# Patient Record
Sex: Female | Born: 1940 | Race: White | Hispanic: No | Smoking: Former smoker
Health system: Southern US, Community
[De-identification: ages and names within clinical notes are randomized; demographics above are authoritative.]

## PROBLEM LIST (undated history)

## (undated) DIAGNOSIS — M4800 Spinal stenosis, site unspecified: Secondary | ICD-10-CM

## (undated) DIAGNOSIS — E11628 Type 2 diabetes mellitus with other skin complications: Secondary | ICD-10-CM

## (undated) DIAGNOSIS — M545 Low back pain, unspecified: Secondary | ICD-10-CM

## (undated) DIAGNOSIS — J4489 Other specified chronic obstructive pulmonary disease: Secondary | ICD-10-CM

## (undated) DIAGNOSIS — J449 Chronic obstructive pulmonary disease, unspecified: Secondary | ICD-10-CM

## (undated) DIAGNOSIS — R269 Unspecified abnormalities of gait and mobility: Secondary | ICD-10-CM

## (undated) DIAGNOSIS — F3289 Other specified depressive episodes: Secondary | ICD-10-CM

## (undated) DIAGNOSIS — F329 Major depressive disorder, single episode, unspecified: Secondary | ICD-10-CM

## (undated) DIAGNOSIS — F411 Generalized anxiety disorder: Secondary | ICD-10-CM

## (undated) DIAGNOSIS — M810 Age-related osteoporosis without current pathological fracture: Secondary | ICD-10-CM

## (undated) DIAGNOSIS — F0390 Unspecified dementia without behavioral disturbance: Secondary | ICD-10-CM

## (undated) DIAGNOSIS — E785 Hyperlipidemia, unspecified: Secondary | ICD-10-CM

## (undated) DIAGNOSIS — B3749 Other urogenital candidiasis: Secondary | ICD-10-CM

## (undated) DIAGNOSIS — K219 Gastro-esophageal reflux disease without esophagitis: Secondary | ICD-10-CM

## (undated) HISTORY — DX: Unspecified dementia, unspecified severity, without behavioral disturbance, psychotic disturbance, mood disturbance, and anxiety: F03.90

## (undated) HISTORY — DX: Hyperlipidemia, unspecified: E78.5

## (undated) HISTORY — DX: Chronic obstructive pulmonary disease, unspecified: J44.9

## (undated) HISTORY — DX: Type 2 diabetes mellitus with other skin complications: E11.628

## (undated) HISTORY — PX: APPENDECTOMY: SHX54

## (undated) HISTORY — PX: VERTEBROPLASTY: SHX113

## (undated) HISTORY — DX: Age-related osteoporosis without current pathological fracture: M81.0

## (undated) HISTORY — PX: ABDOMINAL HYSTERECTOMY: SHX81

## (undated) HISTORY — PX: CHOLECYSTECTOMY: SHX55

---

## 1999-01-09 ENCOUNTER — Other Ambulatory Visit: Admission: RE | Admit: 1999-01-09 | Discharge: 1999-01-09 | Payer: Self-pay | Admitting: Otolaryngology

## 2000-02-19 ENCOUNTER — Encounter: Payer: Self-pay | Admitting: Internal Medicine

## 2000-02-19 ENCOUNTER — Encounter: Admission: RE | Admit: 2000-02-19 | Discharge: 2000-02-19 | Payer: Self-pay | Admitting: Internal Medicine

## 2000-08-23 ENCOUNTER — Other Ambulatory Visit: Admission: RE | Admit: 2000-08-23 | Discharge: 2000-08-23 | Payer: Self-pay | Admitting: Otolaryngology

## 2000-08-23 ENCOUNTER — Encounter (INDEPENDENT_AMBULATORY_CARE_PROVIDER_SITE_OTHER): Payer: Self-pay | Admitting: Specialist

## 2001-01-19 ENCOUNTER — Encounter: Admission: RE | Admit: 2001-01-19 | Discharge: 2001-01-19 | Payer: Self-pay | Admitting: Internal Medicine

## 2001-01-19 ENCOUNTER — Encounter: Payer: Self-pay | Admitting: Internal Medicine

## 2001-06-20 ENCOUNTER — Encounter: Payer: Self-pay | Admitting: Surgery

## 2001-06-20 ENCOUNTER — Emergency Department (HOSPITAL_COMMUNITY): Admission: EM | Admit: 2001-06-20 | Discharge: 2001-06-20 | Payer: Self-pay | Admitting: Emergency Medicine

## 2001-06-22 ENCOUNTER — Inpatient Hospital Stay (HOSPITAL_COMMUNITY): Admission: EM | Admit: 2001-06-22 | Discharge: 2001-06-25 | Payer: Self-pay | Admitting: Emergency Medicine

## 2001-06-22 ENCOUNTER — Encounter: Payer: Self-pay | Admitting: Gastroenterology

## 2001-06-23 ENCOUNTER — Encounter: Payer: Self-pay | Admitting: Gastroenterology

## 2001-08-02 ENCOUNTER — Ambulatory Visit (HOSPITAL_COMMUNITY): Admission: RE | Admit: 2001-08-02 | Discharge: 2001-08-02 | Payer: Self-pay | Admitting: Orthopedic Surgery

## 2001-08-23 ENCOUNTER — Encounter: Payer: Self-pay | Admitting: Neurosurgery

## 2001-08-23 ENCOUNTER — Ambulatory Visit (HOSPITAL_COMMUNITY): Admission: RE | Admit: 2001-08-23 | Discharge: 2001-08-23 | Payer: Self-pay | Admitting: Neurosurgery

## 2001-09-13 ENCOUNTER — Encounter: Admission: RE | Admit: 2001-09-13 | Discharge: 2001-09-13 | Payer: Self-pay | Admitting: Neurosurgery

## 2001-09-13 ENCOUNTER — Encounter: Payer: Self-pay | Admitting: Neurosurgery

## 2001-10-03 ENCOUNTER — Encounter: Admission: RE | Admit: 2001-10-03 | Discharge: 2001-10-03 | Payer: Self-pay | Admitting: Neurosurgery

## 2001-10-03 ENCOUNTER — Encounter: Payer: Self-pay | Admitting: Neurosurgery

## 2001-10-18 ENCOUNTER — Encounter: Admission: RE | Admit: 2001-10-18 | Discharge: 2001-10-18 | Payer: Self-pay | Admitting: Neurosurgery

## 2001-10-18 ENCOUNTER — Encounter: Payer: Self-pay | Admitting: Neurosurgery

## 2001-11-08 ENCOUNTER — Encounter: Admission: RE | Admit: 2001-11-08 | Discharge: 2001-11-08 | Payer: Self-pay | Admitting: Neurosurgery

## 2001-11-24 ENCOUNTER — Encounter: Admission: RE | Admit: 2001-11-24 | Discharge: 2002-02-22 | Payer: Self-pay

## 2002-03-03 ENCOUNTER — Encounter: Admission: RE | Admit: 2002-03-03 | Discharge: 2002-05-09 | Payer: Self-pay

## 2002-05-10 ENCOUNTER — Encounter: Admission: RE | Admit: 2002-05-10 | Discharge: 2002-08-08 | Payer: Self-pay

## 2002-08-24 ENCOUNTER — Encounter: Admission: RE | Admit: 2002-08-24 | Discharge: 2002-11-22 | Payer: Self-pay

## 2002-10-26 ENCOUNTER — Encounter: Payer: Self-pay | Admitting: Internal Medicine

## 2002-10-26 ENCOUNTER — Encounter: Admission: RE | Admit: 2002-10-26 | Discharge: 2002-10-26 | Payer: Self-pay | Admitting: Internal Medicine

## 2002-11-02 ENCOUNTER — Encounter: Admission: RE | Admit: 2002-11-02 | Discharge: 2002-11-02 | Payer: Self-pay | Admitting: *Deleted

## 2002-11-02 ENCOUNTER — Encounter: Payer: Self-pay | Admitting: *Deleted

## 2002-11-03 ENCOUNTER — Ambulatory Visit (HOSPITAL_COMMUNITY): Admission: RE | Admit: 2002-11-03 | Discharge: 2002-11-03 | Payer: Self-pay | Admitting: *Deleted

## 2002-12-07 ENCOUNTER — Encounter
Admission: RE | Admit: 2002-12-07 | Discharge: 2003-03-07 | Payer: Self-pay | Admitting: Physical Medicine & Rehabilitation

## 2004-01-03 ENCOUNTER — Encounter: Admission: RE | Admit: 2004-01-03 | Discharge: 2004-01-03 | Payer: Self-pay | Admitting: Internal Medicine

## 2004-04-15 ENCOUNTER — Encounter: Admission: RE | Admit: 2004-04-15 | Discharge: 2004-04-15 | Payer: Self-pay | Admitting: Internal Medicine

## 2004-05-06 ENCOUNTER — Ambulatory Visit: Payer: Self-pay | Admitting: Internal Medicine

## 2004-06-10 ENCOUNTER — Ambulatory Visit: Payer: Self-pay | Admitting: Internal Medicine

## 2004-11-27 ENCOUNTER — Encounter (INDEPENDENT_AMBULATORY_CARE_PROVIDER_SITE_OTHER): Payer: Self-pay | Admitting: *Deleted

## 2004-11-27 ENCOUNTER — Ambulatory Visit (HOSPITAL_COMMUNITY): Admission: RE | Admit: 2004-11-27 | Discharge: 2004-11-27 | Payer: Self-pay | Admitting: *Deleted

## 2005-02-17 ENCOUNTER — Encounter: Admission: RE | Admit: 2005-02-17 | Discharge: 2005-02-17 | Payer: Self-pay | Admitting: Geriatric Medicine

## 2005-02-26 ENCOUNTER — Emergency Department (HOSPITAL_COMMUNITY): Admission: EM | Admit: 2005-02-26 | Discharge: 2005-02-26 | Payer: Self-pay | Admitting: Emergency Medicine

## 2005-05-08 ENCOUNTER — Ambulatory Visit (HOSPITAL_COMMUNITY): Admission: RE | Admit: 2005-05-08 | Discharge: 2005-05-08 | Payer: Self-pay | Admitting: *Deleted

## 2005-05-20 ENCOUNTER — Encounter: Admission: RE | Admit: 2005-05-20 | Discharge: 2005-05-20 | Payer: Self-pay | Admitting: *Deleted

## 2006-10-14 ENCOUNTER — Inpatient Hospital Stay (HOSPITAL_COMMUNITY): Admission: EM | Admit: 2006-10-14 | Discharge: 2006-10-16 | Payer: Self-pay | Admitting: Emergency Medicine

## 2007-09-10 ENCOUNTER — Inpatient Hospital Stay (HOSPITAL_COMMUNITY): Admission: EM | Admit: 2007-09-10 | Discharge: 2007-09-11 | Payer: Self-pay | Admitting: Emergency Medicine

## 2007-09-12 ENCOUNTER — Inpatient Hospital Stay (HOSPITAL_COMMUNITY): Admission: EM | Admit: 2007-09-12 | Discharge: 2007-09-16 | Payer: Self-pay | Admitting: Emergency Medicine

## 2007-10-01 ENCOUNTER — Emergency Department (HOSPITAL_COMMUNITY): Admission: EM | Admit: 2007-10-01 | Discharge: 2007-10-01 | Payer: Self-pay | Admitting: Emergency Medicine

## 2007-12-25 ENCOUNTER — Ambulatory Visit: Payer: Self-pay | Admitting: Pulmonary Disease

## 2007-12-25 ENCOUNTER — Inpatient Hospital Stay (HOSPITAL_COMMUNITY): Admission: EM | Admit: 2007-12-25 | Discharge: 2007-12-30 | Payer: Self-pay | Admitting: Emergency Medicine

## 2008-01-11 ENCOUNTER — Encounter (INDEPENDENT_AMBULATORY_CARE_PROVIDER_SITE_OTHER): Payer: Self-pay | Admitting: *Deleted

## 2008-01-11 ENCOUNTER — Inpatient Hospital Stay (HOSPITAL_COMMUNITY): Admission: EM | Admit: 2008-01-11 | Discharge: 2008-01-17 | Payer: Self-pay | Admitting: Emergency Medicine

## 2008-01-11 ENCOUNTER — Ambulatory Visit: Payer: Self-pay | Admitting: Cardiovascular Disease

## 2008-01-16 DIAGNOSIS — K219 Gastro-esophageal reflux disease without esophagitis: Secondary | ICD-10-CM

## 2008-01-16 DIAGNOSIS — J45909 Unspecified asthma, uncomplicated: Secondary | ICD-10-CM | POA: Insufficient documentation

## 2008-02-26 ENCOUNTER — Ambulatory Visit: Payer: Self-pay

## 2008-05-06 ENCOUNTER — Inpatient Hospital Stay (HOSPITAL_COMMUNITY): Admission: EM | Admit: 2008-05-06 | Discharge: 2008-05-11 | Payer: Self-pay | Admitting: Emergency Medicine

## 2008-05-06 ENCOUNTER — Ambulatory Visit: Payer: Self-pay | Admitting: Internal Medicine

## 2008-05-15 ENCOUNTER — Encounter: Admission: RE | Admit: 2008-05-15 | Discharge: 2008-05-15 | Payer: Self-pay | Admitting: Internal Medicine

## 2008-06-05 ENCOUNTER — Inpatient Hospital Stay (HOSPITAL_COMMUNITY): Admission: EM | Admit: 2008-06-05 | Discharge: 2008-06-06 | Payer: Self-pay | Admitting: Emergency Medicine

## 2008-07-02 ENCOUNTER — Emergency Department (HOSPITAL_COMMUNITY): Admission: EM | Admit: 2008-07-02 | Discharge: 2008-07-02 | Payer: Self-pay | Admitting: Emergency Medicine

## 2009-09-11 IMAGING — CT CT CERVICAL SPINE W/O CM
3 of 8 series · 10 of 33 positions shown, 12 images · non-contrast
Comparison: CT head dated 11/02/2002

CT HEAD

CLINICAL DATA: The patient fell and struck her head at [DATE] a.m.
this morning.  Altered level of consciousness.

CT HEAD WITHOUT CONTRAST
CT CERVICAL SPINE WITHOUT CONTRAST
TECHNIQUE: Multidetector CT imaging of the head and cervical spine
was performed following the standard protocol without intravenous
contrast.  Multiplanar CT image reconstructions of the cervical
spine were also generated.

[Series 3: recon 2: brain · axial · 0.47mm/px · z∈[+102,+143]mm · 2 of 88 slices shown, 3 images]
[im 30/88  soft-tissue]
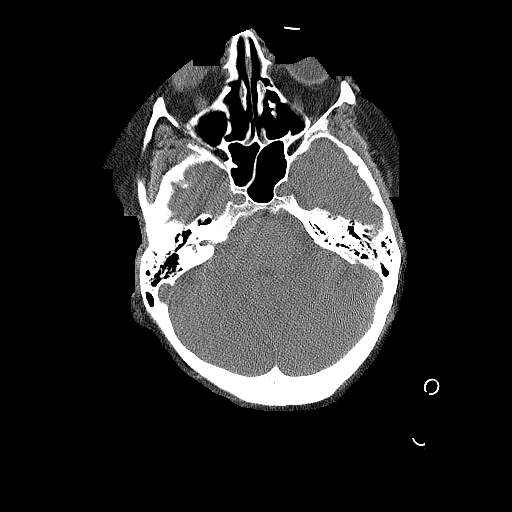
[im 30/88  bone]
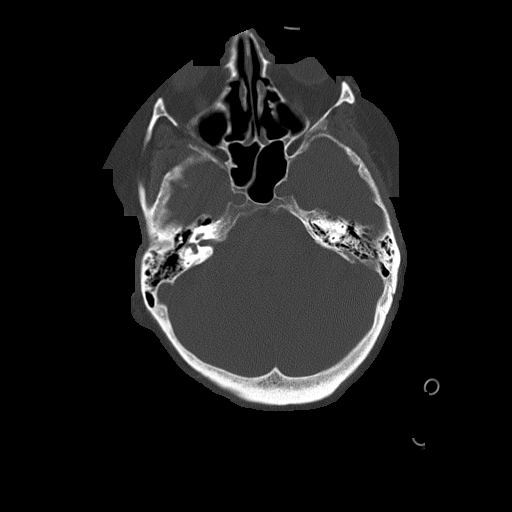
[im 59/88  bone]
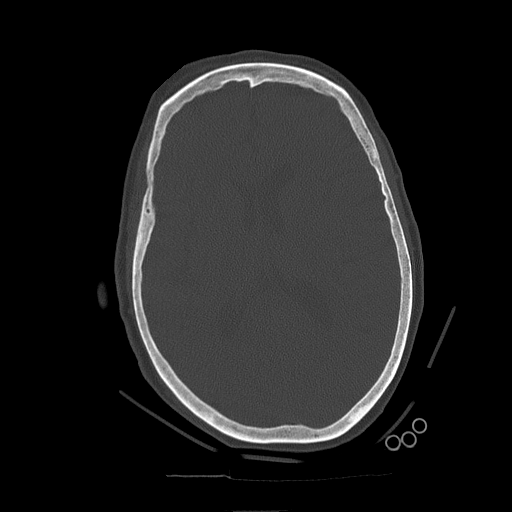

[Series 600: cor spine · coronal · 0.35mm/px · 3 of 32 slices shown]
[im 7/32  bone]
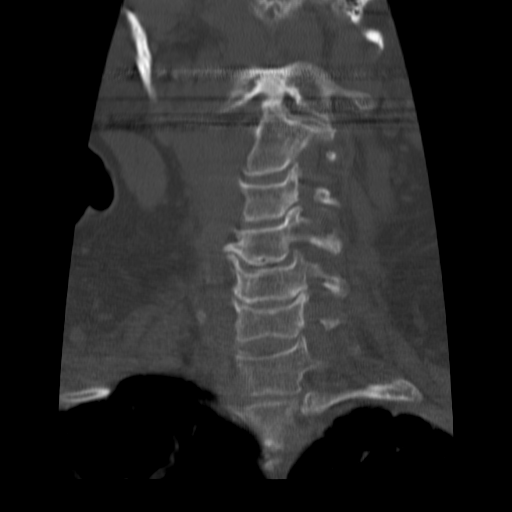
[im 13/32  bone]
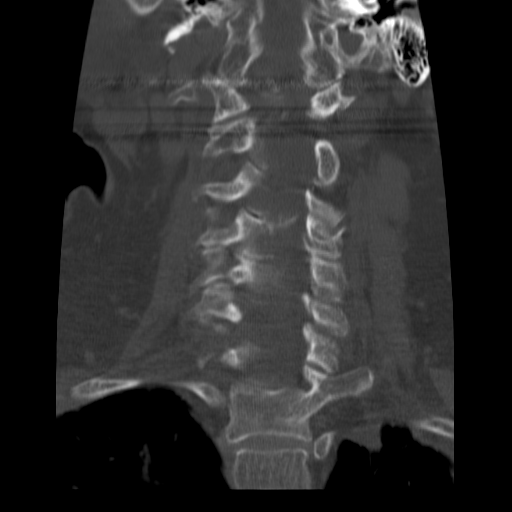
[im 19/32  bone]
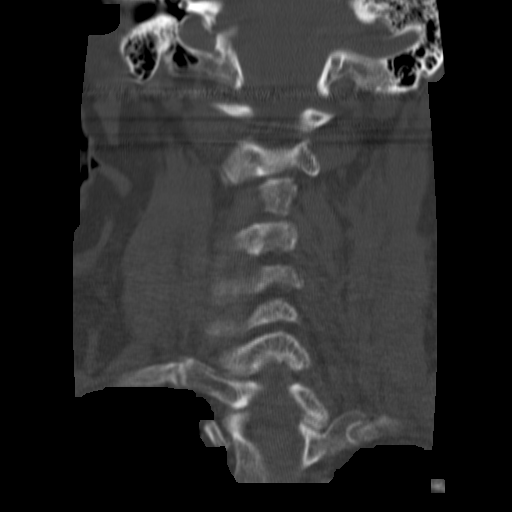

[Series 601: sag spine · sagittal · 0.35mm/px · 5 of 32 slices shown, 6 images]
[im 11/32  bone]
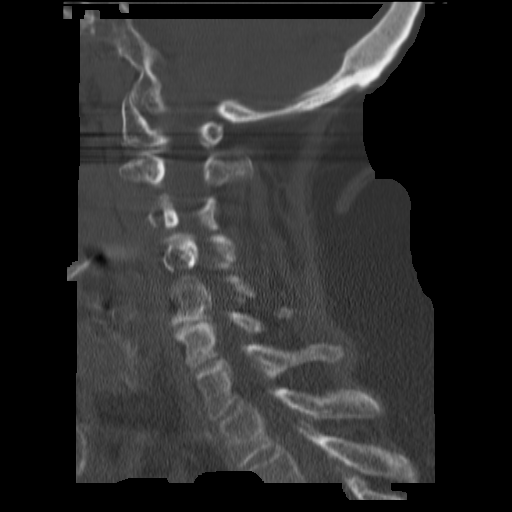
[im 13/32  bone]
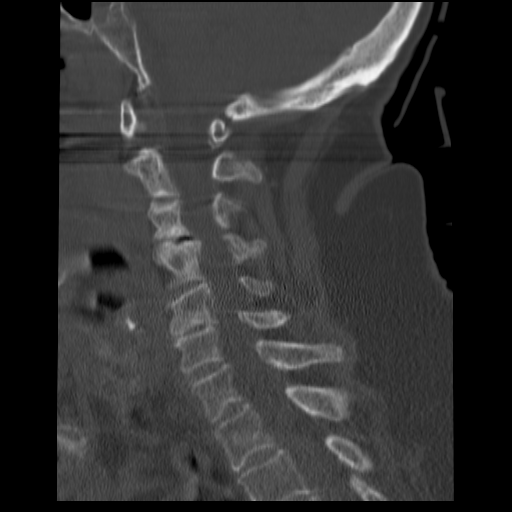
[im 16/32  soft-tissue]
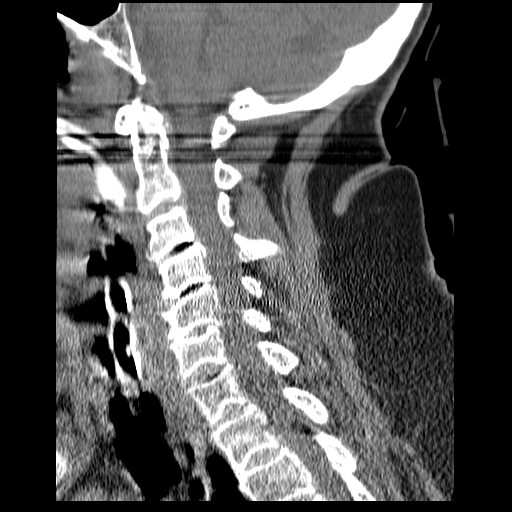
[im 16/32  bone]
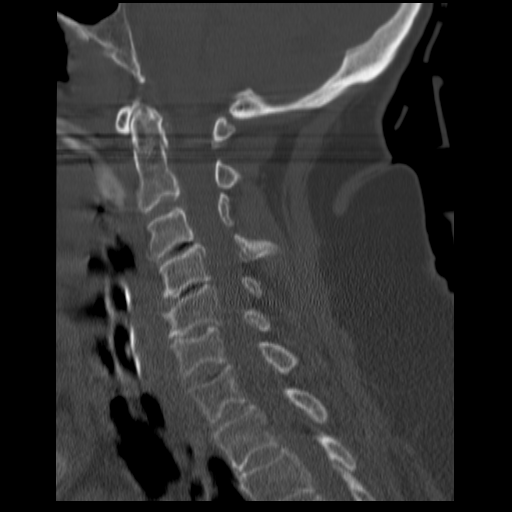
[im 19/32  bone]
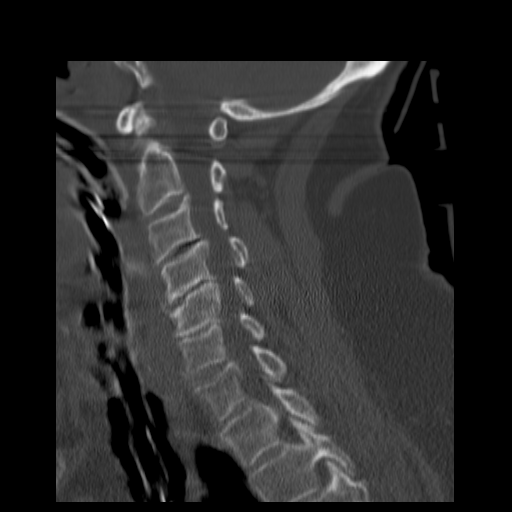
[im 21/32  bone]
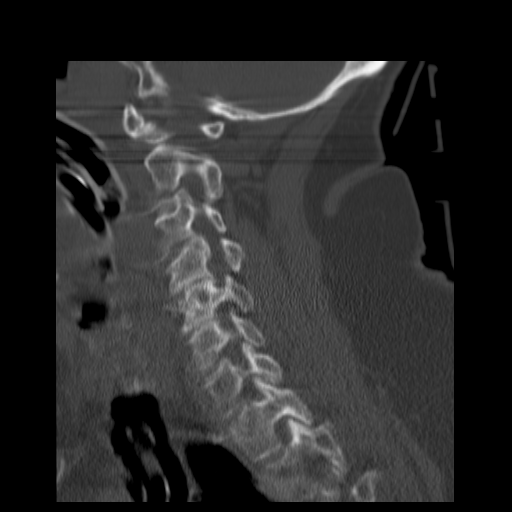

[10 of 33 positions shown; findings below may reference images not displayed]

FINDINGS: There is no acute intracranial hemorrhage, acute
infarction, or mass lesion.  There is asymmetry of the ventricles
which is unchanged since 11/02/2002.  No significant bony
abnormality.
IMPRESSION: No acute intracranial abnormality.

CT CERVICAL SPINE
FINDINGS: The scan extends from the mid clivus through the T1-2
level.  There is slight motion degradation.  However, I feel this
study is diagnostic.  There is degenerative disc disease at C3-4,
C4-5, and C5-6 without evidence of fractures or prevertebral soft
tissue swelling.  There is some degenerative posterior spurring at
C3-4, C4-5, and C5-6 with slight narrowing of the cervical spinal
canal at those levels.  There is mild foraminal stenosis C3-4 on
the left, C4-5 on the right, and bilaterally at C5-6.
IMPRESSION: No acute abnormality of the cervical spine.  Degenerative disc
disease as described.

## 2009-09-12 IMAGING — CR DG CHEST 1V PORT
1 series · 1 of 1 positions shown · non-contrast
Comparison: Portable chest x-ray yesterday.

CLINICAL DATA: Ventilator dependent respiratory failure.

PORTABLE CHEST - 1 VIEW [DATE]/1995 9727 hours:

[AP]
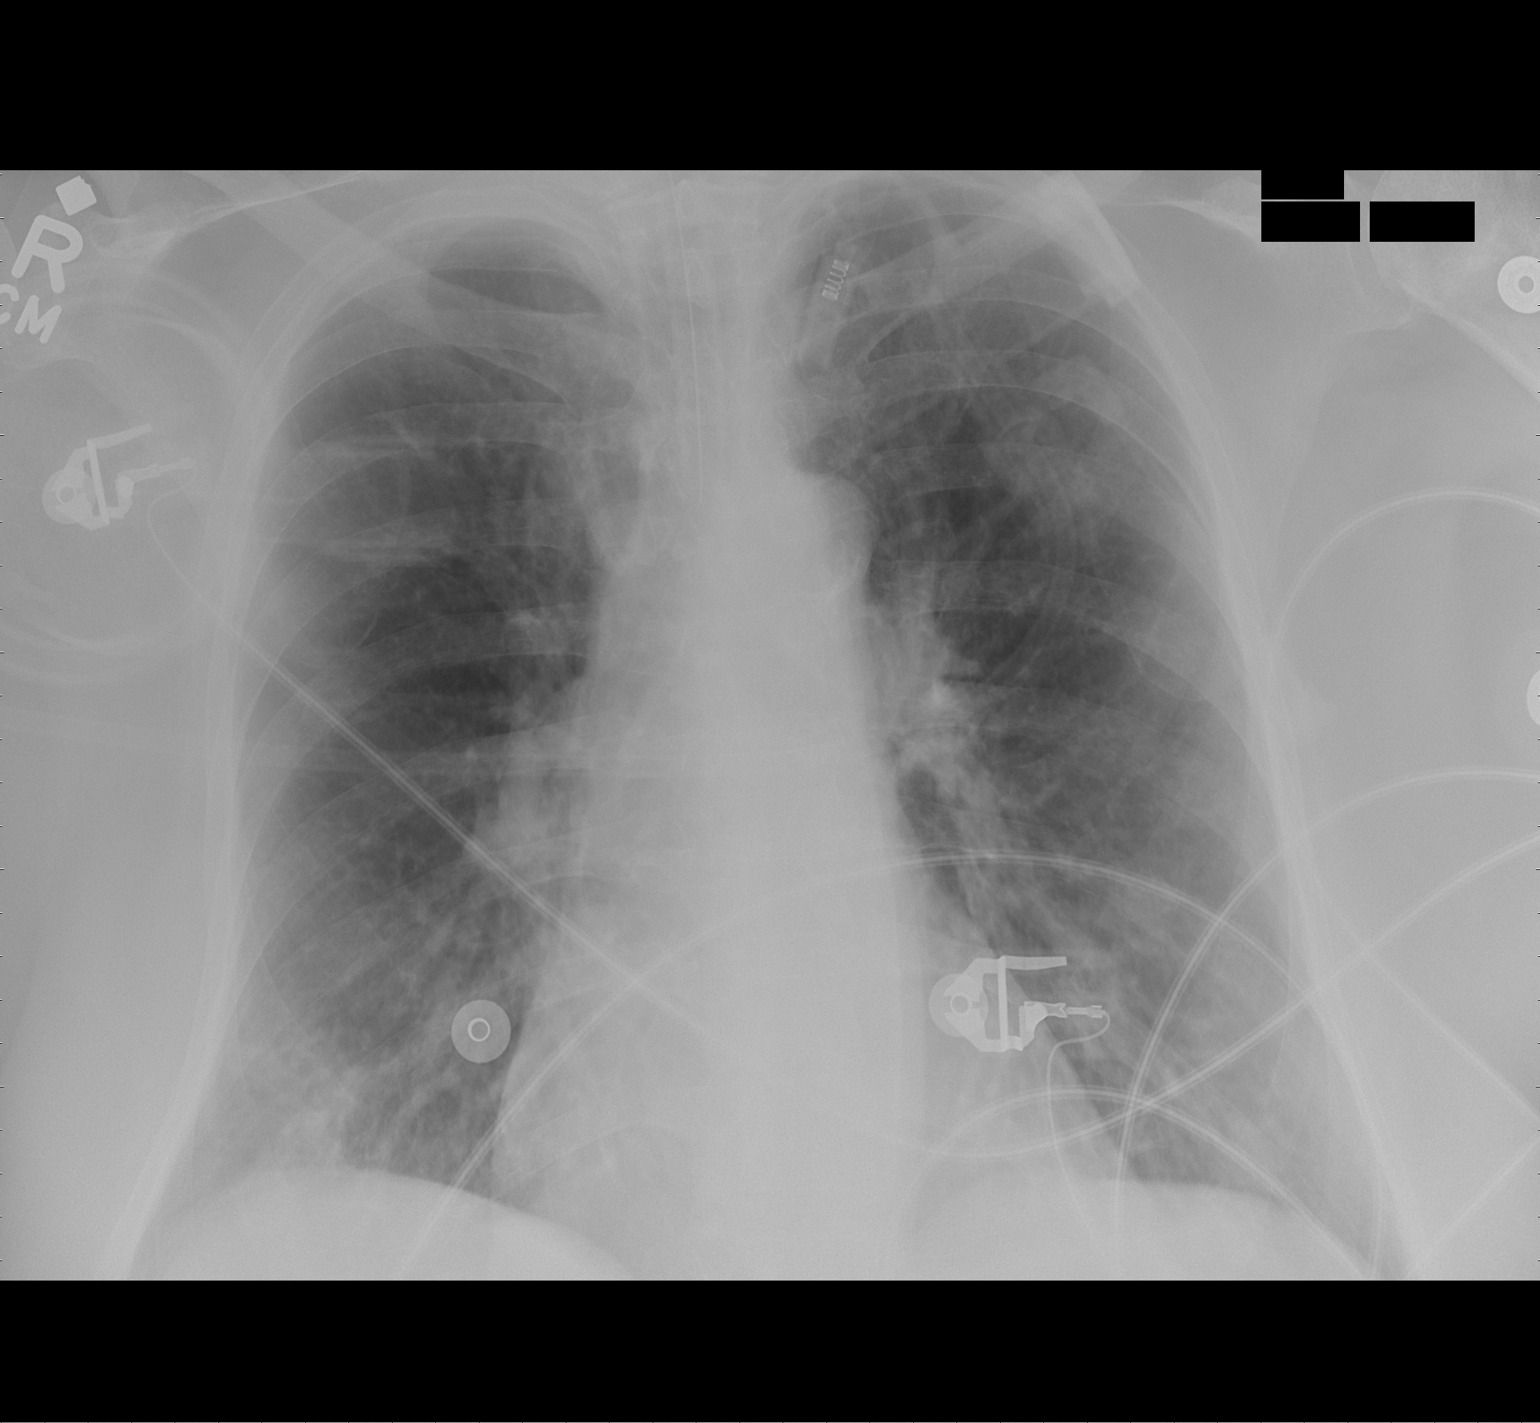

[1 of 1 positions shown; findings below may reference images not displayed]

FINDINGS: Endotracheal tube approximately 2 cm above the carina,
unchanged.  New focal airspace opacity at the right lung base.
Pulmonary parenchyma otherwise clear.  Heart upper normal in size
to slightly enlarged but stable.
IMPRESSION: Endotracheal tube approximately 2 cm above the carina.  Developing
pneumonia in the right lung base.

## 2009-09-13 IMAGING — CR DG CHEST 1V PORT
1 series · 1 of 1 positions shown · non-contrast
Comparison: Chest radiograph 12/26/2007

CLINICAL DATA: Respiratory failure

PORTABLE CHEST - 1 VIEW

[AP]
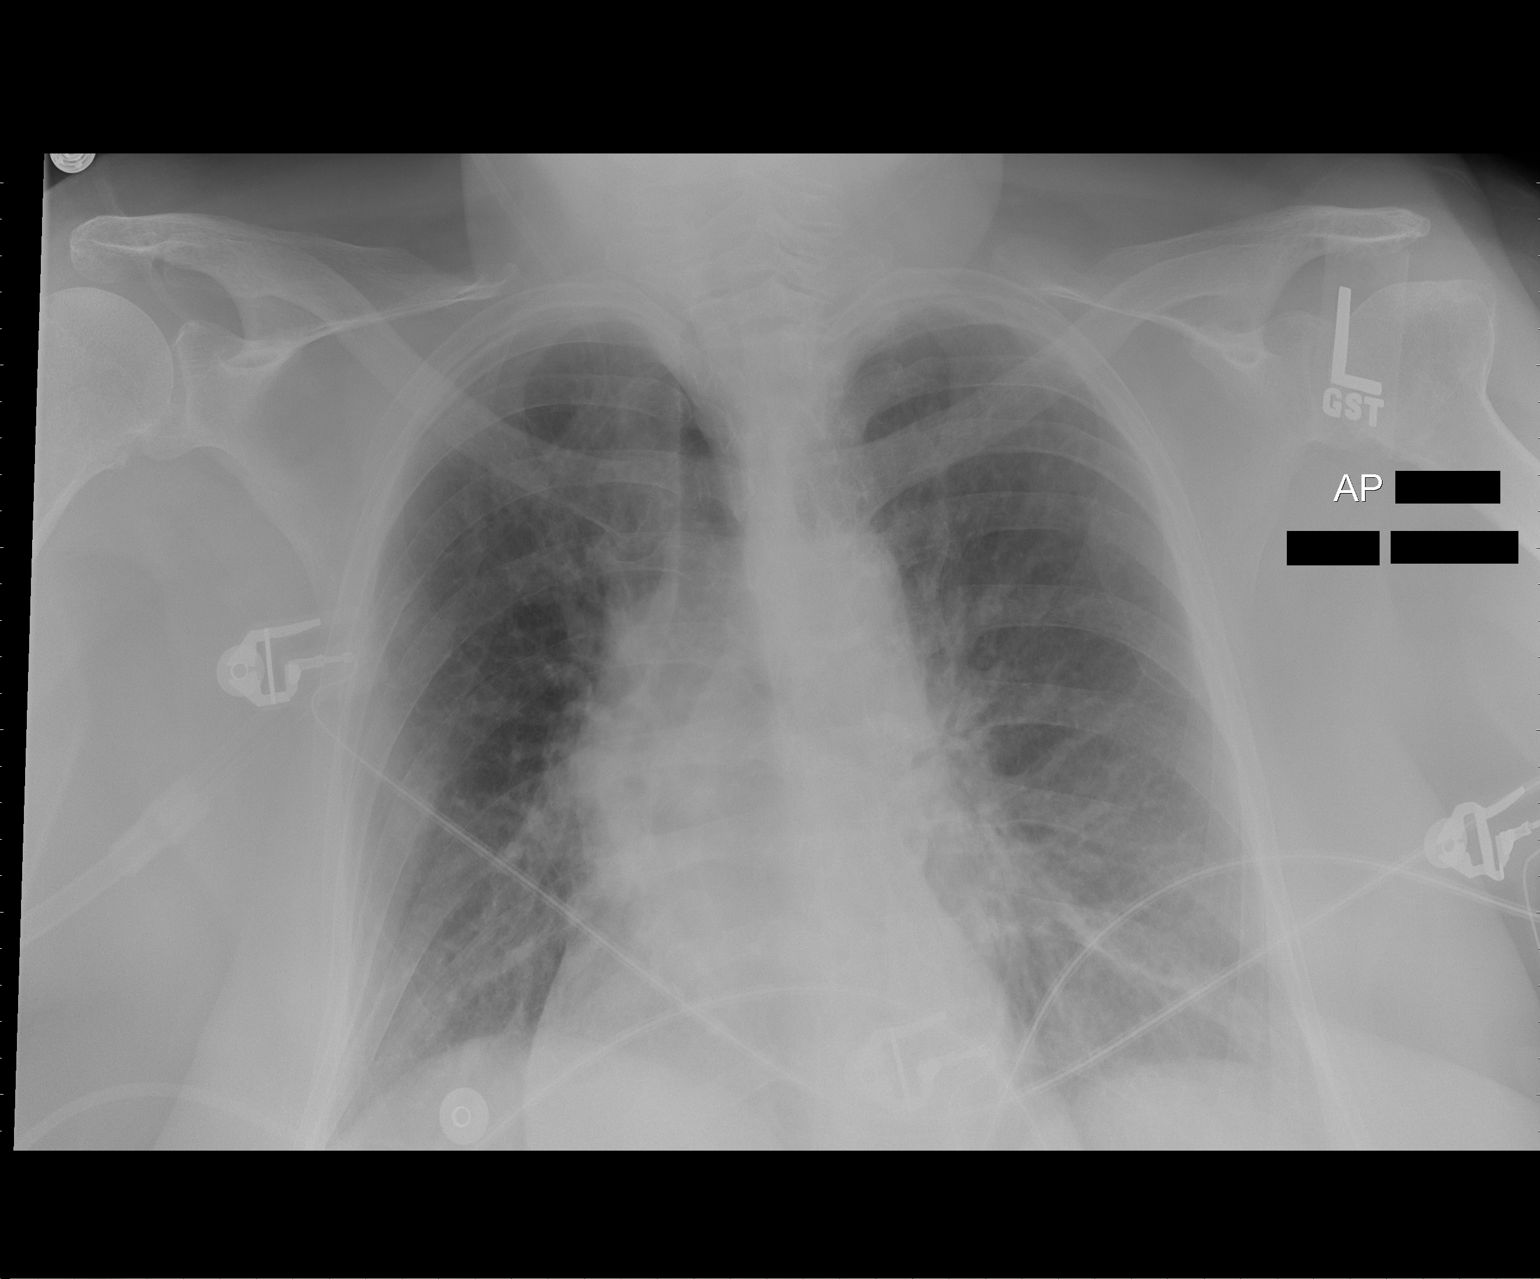

[1 of 1 positions shown; findings below may reference images not displayed]

FINDINGS: Interval extubation with no significant increase in
atelectasis.  Normal cardiac silhouette.  There is a bibasilar
linear atelectasis and low lung volumes.  Mild central venous,
congestion,  no pneumothorax.
IMPRESSION: 1..  Interval extubation.
2..  Low lung volumes and bibasilar atelectasis not significantly
changed.

## 2009-11-28 ENCOUNTER — Ambulatory Visit (HOSPITAL_COMMUNITY): Admission: RE | Admit: 2009-11-28 | Discharge: 2009-11-28 | Payer: Self-pay | Admitting: Internal Medicine

## 2010-05-28 ENCOUNTER — Ambulatory Visit: Payer: Self-pay | Admitting: Internal Medicine

## 2010-05-28 ENCOUNTER — Inpatient Hospital Stay (HOSPITAL_COMMUNITY)
Admission: EM | Admit: 2010-05-28 | Discharge: 2010-06-05 | Payer: Self-pay | Source: Home / Self Care | Admitting: Emergency Medicine

## 2010-05-28 ENCOUNTER — Ambulatory Visit: Payer: Self-pay | Admitting: Pulmonary Disease

## 2010-08-31 ENCOUNTER — Encounter: Payer: Self-pay | Admitting: Internal Medicine

## 2010-08-31 ENCOUNTER — Encounter: Payer: Self-pay | Admitting: *Deleted

## 2010-10-22 LAB — DIFFERENTIAL
Basophils Absolute: 0 K/uL (ref 0.0–0.1)
Basophils Relative: 0 % (ref 0–1)
Eosinophils Absolute: 0 K/uL (ref 0.0–0.7)
Eosinophils Relative: 0 % (ref 0–5)
Lymphocytes Relative: 5 % — ABNORMAL LOW (ref 12–46)
Lymphs Abs: 1.4 K/uL (ref 0.7–4.0)
Monocytes Absolute: 2 K/uL — ABNORMAL HIGH (ref 0.1–1.0)
Monocytes Relative: 7 % (ref 3–12)
Neutro Abs: 24.9 K/uL — ABNORMAL HIGH (ref 1.7–7.7)
Neutrophils Relative %: 88 % — ABNORMAL HIGH (ref 43–77)
Smear Review: ADEQUATE

## 2010-10-22 LAB — GLUCOSE, CAPILLARY
Glucose-Capillary: 104 mg/dL — ABNORMAL HIGH (ref 70–99)
Glucose-Capillary: 104 mg/dL — ABNORMAL HIGH (ref 70–99)
Glucose-Capillary: 105 mg/dL — ABNORMAL HIGH (ref 70–99)
Glucose-Capillary: 108 mg/dL — ABNORMAL HIGH (ref 70–99)
Glucose-Capillary: 112 mg/dL — ABNORMAL HIGH (ref 70–99)
Glucose-Capillary: 116 mg/dL — ABNORMAL HIGH (ref 70–99)
Glucose-Capillary: 123 mg/dL — ABNORMAL HIGH (ref 70–99)
Glucose-Capillary: 126 mg/dL — ABNORMAL HIGH (ref 70–99)
Glucose-Capillary: 137 mg/dL — ABNORMAL HIGH (ref 70–99)
Glucose-Capillary: 138 mg/dL — ABNORMAL HIGH (ref 70–99)
Glucose-Capillary: 145 mg/dL — ABNORMAL HIGH (ref 70–99)
Glucose-Capillary: 149 mg/dL — ABNORMAL HIGH (ref 70–99)
Glucose-Capillary: 152 mg/dL — ABNORMAL HIGH (ref 70–99)
Glucose-Capillary: 156 mg/dL — ABNORMAL HIGH (ref 70–99)
Glucose-Capillary: 158 mg/dL — ABNORMAL HIGH (ref 70–99)
Glucose-Capillary: 161 mg/dL — ABNORMAL HIGH (ref 70–99)
Glucose-Capillary: 180 mg/dL — ABNORMAL HIGH (ref 70–99)
Glucose-Capillary: 188 mg/dL — ABNORMAL HIGH (ref 70–99)
Glucose-Capillary: 86 mg/dL (ref 70–99)
Glucose-Capillary: 93 mg/dL (ref 70–99)
Glucose-Capillary: 94 mg/dL (ref 70–99)
Glucose-Capillary: 97 mg/dL (ref 70–99)
Glucose-Capillary: 99 mg/dL (ref 70–99)

## 2010-10-22 LAB — COMPREHENSIVE METABOLIC PANEL
ALT: 17 U/L (ref 0–35)
ALT: 8 U/L (ref 0–35)
ALT: 8 U/L (ref 0–35)
ALT: 8 U/L (ref 0–35)
ALT: <8 U/L (ref 0–35)
AST: 10 U/L (ref 0–37)
AST: 13 U/L (ref 0–37)
AST: 13 U/L (ref 0–37)
AST: 7 U/L (ref 0–37)
AST: 8 U/L (ref 0–37)
AST: 8 U/L (ref 0–37)
AST: 9 U/L (ref 0–37)
Albumin: 1.8 g/dL — ABNORMAL LOW (ref 3.5–5.2)
Albumin: 2 g/dL — ABNORMAL LOW (ref 3.5–5.2)
Albumin: 2 g/dL — ABNORMAL LOW (ref 3.5–5.2)
Alkaline Phosphatase: 69 U/L (ref 39–117)
Alkaline Phosphatase: 72 U/L (ref 39–117)
Alkaline Phosphatase: 91 U/L (ref 39–117)
BUN: 1 mg/dL — ABNORMAL LOW (ref 6–23)
BUN: 14 mg/dL (ref 6–23)
BUN: 40 mg/dL — ABNORMAL HIGH (ref 6–23)
BUN: 54 mg/dL — ABNORMAL HIGH (ref 6–23)
CO2: 26 mEq/L (ref 19–32)
CO2: 28 mEq/L (ref 19–32)
CO2: 29 mEq/L (ref 19–32)
CO2: 30 mEq/L (ref 19–32)
Calcium: 6.1 mg/dL — CL (ref 8.4–10.5)
Calcium: 6.2 mg/dL — CL (ref 8.4–10.5)
Calcium: 6.5 mg/dL — ABNORMAL LOW (ref 8.4–10.5)
Calcium: 6.6 mg/dL — ABNORMAL LOW (ref 8.4–10.5)
Calcium: 7 mg/dL — ABNORMAL LOW (ref 8.4–10.5)
Calcium: 8.3 mg/dL — ABNORMAL LOW (ref 8.4–10.5)
Chloride: 104 mEq/L (ref 96–112)
Chloride: 105 mEq/L (ref 96–112)
Creatinine, Ser: 0.41 mg/dL (ref 0.4–1.2)
Creatinine, Ser: 0.69 mg/dL (ref 0.4–1.2)
GFR calc Af Amer: >60 mL/min (ref >60)
GFR calc Af Amer: >60 mL/min (ref >60)
GFR calc Af Amer: >60 mL/min (ref >60)
GFR calc Af Amer: >60 mL/min (ref >60)
GFR calc Af Amer: >60 mL/min (ref >60)
GFR calc Af Amer: >60 mL/min (ref >60)
GFR calc non Af Amer: 22 mL/min — ABNORMAL LOW (ref >60)
GFR calc non Af Amer: >60 mL/min (ref >60)
GFR calc non Af Amer: >60 mL/min (ref >60)
GFR calc non Af Amer: >60 mL/min (ref >60)
GFR calc non Af Amer: >60 mL/min (ref >60)
Glucose, Bld: 108 mg/dL — ABNORMAL HIGH (ref 70–99)
Glucose, Bld: 112 mg/dL — ABNORMAL HIGH (ref 70–99)
Glucose, Bld: 115 mg/dL — ABNORMAL HIGH (ref 70–99)
Glucose, Bld: 125 mg/dL — ABNORMAL HIGH (ref 70–99)
Glucose, Bld: 129 mg/dL — ABNORMAL HIGH (ref 70–99)
Glucose, Bld: 131 mg/dL — ABNORMAL HIGH (ref 70–99)
Potassium: 2.8 mEq/L — ABNORMAL LOW (ref 3.5–5.1)
Potassium: 2.9 mEq/L — ABNORMAL LOW (ref 3.5–5.1)
Potassium: 3.1 mEq/L — ABNORMAL LOW (ref 3.5–5.1)
Potassium: 3.4 mEq/L — ABNORMAL LOW (ref 3.5–5.1)
Sodium: 133 mEq/L — ABNORMAL LOW (ref 135–145)
Sodium: 134 mEq/L — ABNORMAL LOW (ref 135–145)
Sodium: 137 mEq/L (ref 135–145)
Sodium: 139 mEq/L (ref 135–145)
Sodium: 139 mEq/L (ref 135–145)
Sodium: 140 mEq/L (ref 135–145)
Total Bilirubin: 0.3 mg/dL (ref 0.3–1.2)
Total Bilirubin: 0.4 mg/dL (ref 0.3–1.2)
Total Bilirubin: 0.6 mg/dL (ref 0.3–1.2)
Total Protein: 4.7 g/dL — ABNORMAL LOW (ref 6.0–8.3)
Total Protein: 4.8 g/dL — ABNORMAL LOW (ref 6.0–8.3)
Total Protein: 4.9 g/dL — ABNORMAL LOW (ref 6.0–8.3)
Total Protein: 5 g/dL — ABNORMAL LOW (ref 6.0–8.3)
Total Protein: 6.6 g/dL (ref 6.0–8.3)

## 2010-10-22 LAB — URINE MICROSCOPIC-ADD ON

## 2010-10-22 LAB — CARBOXYHEMOGLOBIN
Methemoglobin: 1.1 % (ref 0.0–1.5)
O2 Saturation: 70.9 %

## 2010-10-22 LAB — CBC
HCT: 27.3 % — ABNORMAL LOW (ref 36.0–46.0)
HCT: 28.3 % — ABNORMAL LOW (ref 36.0–46.0)
HCT: 32.8 % — ABNORMAL LOW (ref 36.0–46.0)
HCT: 40.2 % (ref 36.0–46.0)
Hemoglobin: 10.6 g/dL — ABNORMAL LOW (ref 12.0–15.0)
Hemoglobin: 13.4 g/dL (ref 12.0–15.0)
Hemoglobin: 9.1 g/dL — ABNORMAL LOW (ref 12.0–15.0)
Hemoglobin: 9.6 g/dL — ABNORMAL LOW (ref 12.0–15.0)
Hemoglobin: 9.7 g/dL — ABNORMAL LOW (ref 12.0–15.0)
MCH: 29.4 pg (ref 26.0–34.0)
MCH: 29.5 pg (ref 26.0–34.0)
MCH: 29.9 pg (ref 26.0–34.0)
MCHC: 33.1 g/dL (ref 30.0–36.0)
MCHC: 33.3 g/dL (ref 30.0–36.0)
MCHC: 33.5 g/dL (ref 30.0–36.0)
MCHC: 33.5 g/dL (ref 30.0–36.0)
MCHC: 33.6 g/dL (ref 30.0–36.0)
MCHC: 33.7 g/dL (ref 30.0–36.0)
MCHC: 33.8 g/dL (ref 30.0–36.0)
MCV: 87.9 fL (ref 78.0–100.0)
MCV: 88.3 fL (ref 78.0–100.0)
MCV: 89 fL (ref 78.0–100.0)
Platelets: 268 10*3/uL (ref 150–400)
Platelets: 300 10*3/uL (ref 150–400)
Platelets: 315 10*3/uL (ref 150–400)
Platelets: 316 10*3/uL (ref 150–400)
Platelets: 324 10*3/uL (ref 150–400)
Platelets: 331 K/uL (ref 150–400)
Platelets: 377 10*3/uL (ref 150–400)
RBC: 3.07 MIL/uL — ABNORMAL LOW (ref 3.87–5.11)
RBC: 3.14 MIL/uL — ABNORMAL LOW (ref 3.87–5.11)
RBC: 3.22 MIL/uL — ABNORMAL LOW (ref 3.87–5.11)
RBC: 4.57 MIL/uL (ref 3.87–5.11)
RDW: 15.3 % (ref 11.5–15.5)
RDW: 15.4 % (ref 11.5–15.5)
RDW: 15.6 % — ABNORMAL HIGH (ref 11.5–15.5)
RDW: 15.7 % — ABNORMAL HIGH (ref 11.5–15.5)
RDW: 15.7 % — ABNORMAL HIGH (ref 11.5–15.5)
RDW: 15.8 % — ABNORMAL HIGH (ref 11.5–15.5)
WBC: 11.8 10*3/uL — ABNORMAL HIGH (ref 4.0–10.5)
WBC: 17.3 10*3/uL — ABNORMAL HIGH (ref 4.0–10.5)
WBC: 20.6 10*3/uL — ABNORMAL HIGH (ref 4.0–10.5)
WBC: 28.3 10*3/uL — ABNORMAL HIGH (ref 4.0–10.5)

## 2010-10-22 LAB — BASIC METABOLIC PANEL
BUN: 35 mg/dL — ABNORMAL HIGH (ref 6–23)
BUN: 51 mg/dL — ABNORMAL HIGH (ref 6–23)
CO2: 24 mEq/L (ref 19–32)
CO2: 24 mEq/L (ref 19–32)
Calcium: 7 mg/dL — ABNORMAL LOW (ref 8.4–10.5)
Chloride: 100 mEq/L (ref 96–112)
Chloride: 90 mEq/L — ABNORMAL LOW (ref 96–112)
Creatinine, Ser: 1.67 mg/dL — ABNORMAL HIGH (ref 0.4–1.2)
Creatinine, Ser: 2.42 mg/dL — ABNORMAL HIGH (ref 0.4–1.2)
GFR calc non Af Amer: 20 mL/min — ABNORMAL LOW (ref >60)
GFR calc non Af Amer: >60 mL/min (ref >60)
Glucose, Bld: 107 mg/dL — ABNORMAL HIGH (ref 70–99)
Glucose, Bld: 118 mg/dL — ABNORMAL HIGH (ref 70–99)
Glucose, Bld: 160 mg/dL — ABNORMAL HIGH (ref 70–99)
Glucose, Bld: 192 mg/dL — ABNORMAL HIGH (ref 70–99)
Potassium: 3 mEq/L — ABNORMAL LOW (ref 3.5–5.1)
Potassium: 3.1 mEq/L — ABNORMAL LOW (ref 3.5–5.1)
Potassium: 3.7 mEq/L (ref 3.5–5.1)
Sodium: 130 mEq/L — ABNORMAL LOW (ref 135–145)
Sodium: 140 mEq/L (ref 135–145)

## 2010-10-22 LAB — BLOOD GAS, ARTERIAL
Acid-base deficit: 2.4 mmol/L — ABNORMAL HIGH (ref 0.0–2.0)
Bicarbonate: 25.5 mEq/L — ABNORMAL HIGH (ref 20.0–24.0)
Drawn by: 23588
O2 Content: 3 L/min
O2 Saturation: 97.3 %
Patient temperature: 100.2
pH, Arterial: 7.363 (ref 7.350–7.400)
pO2, Arterial: 58.3 mmHg — ABNORMAL LOW (ref 80.0–100.0)
pO2, Arterial: 96.5 mmHg (ref 80.0–100.0)

## 2010-10-22 LAB — SEDIMENTATION RATE: Sed Rate: 95 mm/hr — ABNORMAL HIGH (ref 0–22)

## 2010-10-22 LAB — CARDIAC PANEL(CRET KIN+CKTOT+MB+TROPI)
Relative Index: INVALID (ref 0.0–2.5)
Total CK: 32 U/L (ref 7–177)
Troponin I: 0.07 ng/mL — ABNORMAL HIGH (ref 0.00–0.06)

## 2010-10-22 LAB — COMPREHENSIVE METABOLIC PANEL WITH GFR
AST: 16 U/L (ref 0–37)
Albumin: 3 g/dL — ABNORMAL LOW (ref 3.5–5.2)
Alkaline Phosphatase: 108 U/L (ref 39–117)
CO2: 31 meq/L (ref 19–32)
Chloride: 85 meq/L — ABNORMAL LOW (ref 96–112)
Creatinine, Ser: 4.9 mg/dL — ABNORMAL HIGH (ref 0.4–1.2)
GFR calc Af Amer: 11 mL/min — ABNORMAL LOW (ref >60)
GFR calc non Af Amer: 9 mL/min — ABNORMAL LOW (ref >60)
Potassium: 3.1 meq/L — ABNORMAL LOW (ref 3.5–5.1)
Total Bilirubin: 0.7 mg/dL (ref 0.3–1.2)

## 2010-10-22 LAB — CULTURE, BLOOD (ROUTINE X 2): Culture  Setup Time: 201110200845

## 2010-10-22 LAB — URINALYSIS, ROUTINE W REFLEX MICROSCOPIC
Glucose, UA: NEGATIVE mg/dL
Nitrite: NEGATIVE
Protein, ur: 30 mg/dL — AB
Specific Gravity, Urine: 1.027 (ref 1.005–1.030)
Urobilinogen, UA: 0.2 mg/dL (ref 0.0–1.0)
pH: 5 (ref 5.0–8.0)

## 2010-10-22 LAB — HEMOGLOBIN A1C
Hgb A1c MFr Bld: 6.6 % — ABNORMAL HIGH (ref <5.7)
Mean Plasma Glucose: 143 mg/dL — ABNORMAL HIGH (ref <117)

## 2010-10-22 LAB — LIPASE, BLOOD
Lipase: 15 U/L (ref 11–59)
Lipase: 17 U/L (ref 11–59)

## 2010-10-22 LAB — PHOSPHORUS: Phosphorus: 2.7 mg/dL (ref 2.3–4.6)

## 2010-10-22 LAB — HEMOGLOBINOPATHY EVALUATION
Hemoglobin Other: 0 % (ref 0.0–0.0)
Hgb A2 Quant: 2.4 % (ref 2.2–3.2)
Hgb F Quant: 0 % (ref 0.0–2.0)

## 2010-10-22 LAB — APTT: aPTT: 34 seconds (ref 24–37)

## 2010-10-22 LAB — CALCIUM, IONIZED: Calcium, Ion: 0.85 mmol/L — ABNORMAL LOW (ref 1.12–1.32)

## 2010-10-22 LAB — EXPECTORATED SPUTUM ASSESSMENT W GRAM STAIN, RFLX TO RESP C

## 2010-10-22 LAB — CK TOTAL AND CKMB (NOT AT ARMC): Total CK: 24 U/L (ref 7–177)

## 2010-10-22 LAB — STOOL CULTURE

## 2010-10-22 LAB — PROTIME-INR: Prothrombin Time: 14.9 seconds (ref 11.6–15.2)

## 2010-10-22 LAB — CULTURE, RESPIRATORY W GRAM STAIN

## 2010-10-22 LAB — MAGNESIUM
Magnesium: 1.5 mg/dL (ref 1.5–2.5)
Magnesium: 1.7 mg/dL (ref 1.5–2.5)

## 2010-10-22 LAB — CREATININE, URINE, RANDOM: Creatinine, Urine: 145.8 mg/dL

## 2010-10-22 LAB — TYPE AND SCREEN: ABO/RH(D): A NEG

## 2010-10-22 LAB — URINE CULTURE
Colony Count: NO GROWTH
Culture  Setup Time: 201110200230

## 2010-10-22 LAB — NA AND K (SODIUM & POTASSIUM), RAND UR: Potassium Urine: 18 mEq/L

## 2010-10-22 LAB — CLOSTRIDIUM DIFFICILE BY PCR

## 2010-10-22 LAB — CORTISOL: Cortisol, Plasma: 22.3 ug/dL

## 2010-10-22 LAB — ABO/RH: ABO/RH(D): A NEG

## 2010-10-22 LAB — OSMOLALITY, URINE: Osmolality, Ur: 326 mOsm/kg — ABNORMAL LOW (ref 390–1090)

## 2010-10-22 LAB — AMYLASE: Amylase: 20 U/L (ref 0–105)

## 2010-10-22 LAB — CHLORIDE, URINE, RANDOM: Chloride Urine: 48 mEq/L

## 2010-12-23 NOTE — Discharge Summary (Signed)
NAME:  [name] [name], SHOFF               ACCOUNT NO.:  [id number]   MEDICAL RECORD NO.:  [id number]          PATIENT TYPE:  INP   LOCATION:  1420                         FACILITY:  [facility]   PHYSICIAN:  Isidor Holts, M.D.  DATE OF BIRTH:  Dec 29, 1940   DATE OF ADMISSION:  01/10/2008  DATE OF DISCHARGE:  01/17/2008                               DISCHARGE SUMMARY   PMD:  Dr. Jonny Ruiz Sl[place]ky/Dr. Leodis Rains.   For Discharge Diagnosis, admission history, see clinical course.   DISCHARGE MEDICATIONS:  Refer to interim Discharge Summary dictated January 16, 2007 by Dr. Hannah Beat.  In addition the patient was clinically  stable on January 17, 2008.  There were no new issues.  Chest x-ray done  January 17, 2008 showed resolution of left lower lobe infiltrate/effusion,  no active cardiopulmonary disease.  The patient was therefore considered  clinically stable for discharge, and was discharged accordingly.   MEDICATIONS:  Refer to above mentioned interim Discharge Summary.  Otherwise:   1. Augmentin 500 kg p.o. t.i.d. for three days only from January 18, 2008.      Isidor Holts, M.D.  Electronically Signed     CO/MEDQ  D:  01/17/2008  T:  01/17/2008  Job:  [id number]   cc:   Cheri Rous  Fax: [phone]   Samara Snide, MD

## 2010-12-23 NOTE — Discharge Summary (Signed)
NAME:  [name] [name], [name] [name]               ACCOUNT NO.:  [id number]   MEDICAL RECORD NO.:  [id number]          PATIENT TYPE:  INP   LOCATION:  1523                         FACILITY:  [facility]   PHYSICIAN:  Georgann Housekeeper, MD      DATE OF BIRTH:  03-05-1941   DATE OF ADMISSION:  09/10/2007  DATE OF DISCHARGE:  09/11/2007                               DISCHARGE SUMMARY   PRIMARY CARE PHYSICIAN:  Deirdre Peer. Polite, M.D., [facility]  at [place].   DISCHARGE DIAGNOSIS:  Back pain.   MEDICATION ON DISCHARGE:  1. Coreg 6.25 once a day.  2. Prevacid 30 mg daily.  3. Lipitor 40 mg daily.  4. Theophylline 100 mg daily.  5. Plavix 25 mg daily.  6. Xanax 1 mg three times a day.  7. Combivent MDI as needed.  8. HCTZ 25 mg daily.  9. Ultram 15 mg t.i.d. p.r.n. for pain.  10.Phenergan 25 mg p.r.n. for nausea q.6h.   HOSPITAL COURSE:  A 70 year old female with history of coronary artery  disease, hypertension, depression, anxiety and COPD who was recently  discharged from [facility] for abdominal pain.  She was admitted  for complaining of some back pain and some urinary frequency.  The  patient had workup including urinalysis, blood chemistries, blood counts  and LFT's; they were all normal.  She had an CT MRI of the spine which  showed no evidence of any fracture or acute nerve problem.  It showed  some arthritis and spinal stenosis.  The patient was admitted was  started on some Ultram for pain.  It had improved significantly  overnight.  She still had some urinary frequency.  No evidence of any  UTI.  Thought to be some bladder incontinence symptoms.  The patient  will get some [facility] needs, and she usually uses a walker to  walk at home and will continue that.  Will need to get the records from  [facility] about recent hospitalization.  The patient was also  noted to have some bruising on her right arm, and they told her it  happened after she had multiple  IV sticks and also had some bruising on  the left arm which are results of the IV sites which she said happened  at [facility].  No evidence of infection on the arms or any  phlebitis.   FOLLOWUP:  Follow up with Dr. Nehemiah Settle or Chales Abrahams Placey, N.P. in 1 week  at the office.      Georgann Housekeeper, MD  Electronically Signed     KH/MEDQ  D:  09/11/2007  T:  09/12/2007  Job:  [id number]

## 2010-12-23 NOTE — H&P (Signed)
NAME:  [name] [name], [name] [name]               ACCOUNT NO.:  [id number]   MEDICAL RECORD NO.:  [id number]          PATIENT TYPE:  INP   LOCATION:  1526                         FACILITY:  [facility]   PHYSICIAN:  Kela Millin, M.D.DATE OF BIRTH:  December 06, 1940   DATE OF ADMISSION:  09/12/2007  DATE OF DISCHARGE:                              HISTORY & PHYSICAL   PRIMARY CARE PHYSICIAN:  Deirdre Peer. Polite, M.D.   CHIEF COMPLAINT:  Worsening low back pain and epigastric pain.   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  She states that after she went home last p.m. she  woke up this morning with worsening of low back pain, and she had  difficulty walking and she fell.  She reports that her pain today was  worse than when she left the hospital, and overall, she felt weaker, and  that is why she came back to the emergency room.  It was noted per  emergency room physician that the patient lives alone, and so on further  questioning, she indicated to the emergency room physician that she  would like to be placed an extended care facility because she is not  able to take care of herself at home.  The ER physician also talked to  the patient's son who agreed that she would require placement.  At the  time of my interviewing, the patient denies any urinary incontinence,  although, it is noted per ER records that she had reported urinary  incontinence upon arrival.  In reviewing the patient's discharge summary  from September 11, 2007, it is noted that workup of her low back  pain/urinary incontinence during this last  hospitalization included a  CT/MRI of lumbar spine which was negative for fractures and no acute  nerve problems.  In the ER, she had another urinalysis done which was  negative for infection, and her electrolytes showed a mild hypokalemia.  She is admitted for further management and possible placement.  She  denies chest pain, shortness of breath, cough, fevers, dysuria, melena,  diarrhea, hematochezia and no focal weakness.  She also is complaining  of increased epigastric pain with belching since her discharge  yesterday.   PAST MEDICAL HISTORY:  As above.   MEDICATIONS:  1. Coreg 6.25 mg daily.  2. Prevacid 30 mg daily.  3. Lipitor 40 mg daily.  4. Theophylline 100 mg daily.  5. Plavix 75 mg daily.  6. Xanax 1 mg t.i.d.  7. Combivent p.r.n.  8. HCTZ 25 mg daily.  9. Ultram 50 mg t.i.d. p.r.n.  10.Phenergan 25 mg q.6 h p.r.n.   ALLERGIES:  CODEINE, COMPAZINE, REGLAN AND SULFA.   SOCIAL/FAMILY HISTORY:  As per recent H&P.   REVIEW OF SYSTEMS:  As per HPI.  Other review of systems negative.   PHYSICAL EXAMINATION:  GENERAL:  The patient is an obese elderly white  female in no respiratory distress.  VITAL SIGNS:  Temperature 97.7, blood pressure 109/69, pulse 92 with  respirations 20, O2 saturation 98%.  HEENT:  PERRL.  EOMI.  Sclerae anicteric.  Mucous membranes moist.  No  oral exudates.  LUNGS:  Decreased breath sounds on the bases, otherwise, clear to  auscultation.  No wheezes.  CARDIOVASCULAR:  Regular rate and rhythm, normal S1, S2.  ABDOMEN:  Obese, bowel sounds present.  Epigastric tenderness.  No  rebound.  Soft, no organomegaly and no masses palpable.  EXTREMITIES:  No cyanosis and no edema.  NEUROLOGICAL:  She is alert and oriented x3.  Cranial nerves II-XII are  grossly intact.  Strength is 4+/5 and symmetric.  Sensory is grossly  intact, nonfocal exam.   LABORATORY DATA:  Sodium 141, potassium 3.2, chloride 105, CO2 28,  glucose 91, BUN 4, creatinine 0.45,  calcium 8.7, urinalysis is negative  for infection.   ASSESSMENT/PLAN:  1. Recurrent low back pain, as discussed above - recent MRI is      negative for infection and no acute nerve problems.  Urinalysis as      above, negative for urinary tract infection.  Will start on      analgesics for pain management, Physical therapy/occupational      therapy consult.  The patient also complaining of generalized      weakness or obtain a TSH.  2. Epigastric pain - likely secondary to gastroesophageal reflux      disease.  Will place on PPI, will obtain EGG and follow.  3. Hypertension - continue outpatient medications.  4. History of coronary artery disease/MI in the past - continue      outpatient medications and EKG as above.  5. Hypokalemia - replace potassium.  6. Hyperlipidemia - continue outpatient medications.  7. History of chronic obstructive pulmonary disease - continue      bronchodilators.  8. History of anxiety/depression.  Continue outpatient medications.  9  social services consulted for possible placement.      Kela Millin, M.D.  Electronically Signed     ACV/MEDQ  D:  09/12/2007  T:  09/13/2007  Job:  [id number]   cc:   Deirdre Peer. Polite, M.D.

## 2010-12-23 NOTE — H&P (Signed)
NAME:  [name] [name], [name] [name] NO.:  [id number]   MEDICAL RECORD NO.:  [id number]          PATIENT TYPE:  INP   LOCATION:  1523                         [facility]   PHYSICIAN:  Gardiner Barefoot, MD    DATE OF BIRTH:  1941-06-19   DATE OF ADMISSION:  09/10/2007  DATE OF DISCHARGE:  09/11/2007                              HISTORY & PHYSICAL   PRIMARY CARE PHYSICIAN:  Lavinia Sharps, nurse practitioner, [facility].   CHIEF COMPLAINT:  Urinary incontinence.   HISTORY OF PRESENT ILLNESS:  This is a 70 year old female with a recent  history of intractable nausea, vomiting, hypokalemia requiring  hospitalization at [facility] where she stayed for approximately  1 week and was discharged 1 day prior to this admission.  She was sent  home and noted after the admission that she was incontinent of urine  today.  The patient reports she has never had this issue before and had  some dribbling and overt incontinence.  She said it occurred several  times during the day.  She denies having any burning or pyuria.  She  does also report low-back pain which is the same as it has been in past  years.  She does report that she has not been going to pain and  rehabilitation medicine anymore for the last several years.  The patient  denies any fever or any current nausea, vomiting or diarrhea.  She also  reports no abdominal pain.   PAST MEDICAL HISTORY:  1. Chronic back pain with lumbar stenosis.  2. Hypertension.  3. History of MI.  4. Hyperlipidemia.  5. GERD.  6. Asthma.  7. History of hysterectomy with retained left ovary.  8. Hiatal hernia.  9. Cataracts.   FAMILY HISTORY:  1. Cancer.  2. Diabetes.  3. Lung disease.   SOCIAL HISTORY:  The patient is a current smoker.  Denies alcohol or  drugs.   DRUG ALLERGIES:  1. COMPAZINE.  2. REGLAN.  3. SULFA DRUGS.   MEDICATIONS:  1. Carvedilol 6.25 mg b.i.d.  2. Prevacid.  3. Lipitor 40 mg  daily.  4. Plavix 75 mg daily.  5. Phenergan p.r.n.  6. Alprazolam p.r.n.  7. Hydrochlorothiazide unknown dose.  8. Combivent.   REVIEW OF SYSTEMS:  Negative except as per the history of present  illness.   PHYSICAL EXAMINATION:  VITAL SIGNS:  Temperature is 100.1, pulse 76,  respirations 20, blood pressure 143/74, and O2 saturations 93%.  GENERAL:  The patient is awake, alert and oriented x3 and appears in  mild distress with pain.  CHEST:  Diffuse wheezing and no crackles noted.  HEART:  Regular rate and rhythm with no murmurs, rubs or gallops.  ABDOMEN:  Soft, nontender, nondistended with positive bowel sounds and  no hepatosplenomegaly.  GU:  Foley present.  EXTREMITIES:  No cyanosis, clubbing or edema.  SKIN:  Large ecchymosis on the right arm from a previous IV site.  NEUROLOGIC:  Cranial nerves II-XII intact.   LABORATORY DATA:  Influenza swab negative.  Sodium 139, potassium 3.5,  chloride 101,  bicarb 30, BUN 1, creatinine 0.48, glucose 123, AST 12,  ALT 15.  WBC is 11.7 with 81% neutrophils, hemoglobin 12, platelets 371.  UA negative.  MRI with no acute findings, moderate spinal stenosis in  the lumbar.   IMPRESSION AND PLAN:  1. Urinary incontinence.  The patient does not report having had this      before.  It does not appear to be an infection as her urinalysis is      clear.  It may be secondary to recent Foley placement her      hospitalization which she just left yesterday, or other mechanical      defect.  MRI is reassuring for no acute back pathology.  Will      consider discontinuing the Foley in the next day or two and      observe.  2. Chronic back pain.  The patient reports the pain as it has been in      the past and was encouraged to reestablish care with a      rehabilitation and pain specialist.  3. Smoking and asthma.  Will continue the patient with inhalers as      needed.  4. Hypertension.  Will continue the patient's blood pressure       medication.  5. History of myocardial infarction.  Will continue with Plavix.  6. Anxiety.  Will use Ativan p.r.n.  7. Chronic pain.  Will use Ultram and a small amount of Dilaudid for      breakthrough pain.  Will also consider long-acting agents at      discharge.      Gardiner Barefoot, MD  Electronically Signed     RWC/MEDQ  D:  09/10/2007  T:  09/11/2007  Job:  [id number]

## 2010-12-23 NOTE — H&P (Signed)
NAME:  [name]               ACCOUNT NO.:  [id number]   MEDICAL RECORD NO.:  [id number]          PATIENT TYPE:  INP   LOCATION:  1420                         FACILITY:  [facility]   PHYSICIAN:  Mobolaji B. Bakare, M.D.DATE OF BIRTH:  1940/11/04   DATE OF ADMISSION:  01/10/2008  DATE OF DISCHARGE:                              HISTORY & PHYSICAL   PRIMARY CARE PHYSICIAN:  Dr. [name] with [facility].   CHIEF COMPLAINT:  The patient is transferred from the nursing home  because of a low hemoglobin.   HISTORY OF THE PRESENTING COMPLAINT:  The patient is a 70 year old  Caucasian female who was recently hospitalized on Dec 25, 2007 and  discharged on Dec 30, 2007.  During that hospitalization she was  intubated for approximately 36 hours for altered mental status and  hypercapnic respiratory failure.  She was treated for pneumonia.  According to the records from the nursing home a CBC drawn 2 days ago  showed a hemoglobin of 8.7 and a hematocrit of 26.1.  The hemoglobin was  12.9 on October 17, 2007.  A repeat CBC was done yesterday, which was  fairly consistent with the previous reading; hemoglobin was 8.5 and  hematocrit was 24.6; therefore, the patient was sent over to the  emergency room for further evaluation.  She denies any melanotic stool  or hematochezia.  No hematemesis.  Rectal examination performed by the  emergency room physician showed no melanotic stool or bright red blood  on gloved finger.  The stool Hemoccult was negative.  Hemoglobin and  hematocrit have trended downwards since the hospitalization about 2-3  weeks ago, such that at the time of discharge on Dec 30, 2007 the  hemoglobin was 9.4 with a hematocrit of 28.   The patient had a chest x-ray done in the emergency room, which showed  interstitial mild interstitial pulmonary edema and probable pneumonia.  The patient endorses shortness of breath and cough productive of thick  yellow sputum.  No  pleuritic chest pain.  She tells me that she has been  having a fever, but this has not been documented at the nursing home.  She also endorses chills.  She denies dysuria, urgency or increased  frequency of micturition as well as diarrhea, abdominal pain, nausea and  vomiting.  She was given 80 mg of Lasix in the emergency room and she  has diuresed about 2 liters of clear urine.   REVIEW OF SYSTEMS:  The patient has generalized body pains.  She has  difficulty ambulating; she needs assistance as well as __________ .  She  denies headaches or changes in her vision.  The rest of the review of  systems is unremarkable or as mentioned in the history of presenting  complaint.  The patient denies orthopnea, PND.  She is not aware of  lower extremity edema.   PAST MEDICAL HISTORY:  1. Asthma/COPD.  2. Remote history of myocardial infarction.  3. Lumbar stenosis with chronic back pain.  4. Gastroesophageal reflux disease.  5. Hypertension.  6. Depression.  7. Cataracts.  8. Morbid obesity.  9. Recent hospitalization for ventilator-dependent hypercapnic      respiratory failure and altered mental status.   MEDICATIONS:  The patient's current medications include:  1. Advair Diskus 250/50 one puff twice a day.  2. Omeprazole 20 mg daily.  3. Lexapro 5 mg daily.  4. Metoprolol 25 mg daily.  5. Simvastatin 80 mg daily.  6. Plavix 75 mg daily.  7. Xanax 1 mg as needed.  8. Ambien 5 mg at bedtime as needed for sleep.  9. Phenergan 25 mg every 6 hours as needed.  10.Ultram 50 mg every 6 hours as needed.  11.Oxycodone 5 mg every 4 hours as needed.  12.Nystatin ream to the areas of rash on breast bilaterally.   ALLERGIES:  The patient's are multiple and include;  1. AVELOX.  2. CODEINE.  3. COMPAZINE.  4. MORPHINE.  5. REGLAN.  6. SULFA.  7. PREDNISONE, WHICH CAUSES HIVES.   FAMILY HISTORY:  Both parents are disease; father passed away from lung  cancer at the age of 72 and mother  passed away from old at the age of  16.  The patient had one sister who died from Hodgkin's disease at the  age of 47.   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  She smoked for 10  years, about one pack per day; she quit several years ago.  She does not  drink alcohol.  Prior to the most recent hospitalization on Dec 25, 2007  the patient was living at home ambulating and driving her car.  She has  become deconditioned such that she needed to go to skilled nursing  facility daily for rehabilitation.  She is currently continues to need  assistance with ambulation.   PHYSICAL EXAMINATION:  VITAL SIGNS:  Initial vital signs show  temperature 98.3, blood pressure 103/55 with a follow up blood pressure  of 99/51, pulse 87, respiratory rate 18, and O2 sats 98% on oxygen.  GENERAL APPEARANCE:  On examination the patient is awake, alert and  oriented to time, place and person.  HEAD AND NECK:  Normocephalic and  atraumatic head.  Pupils are equal,  round and react to light.  Extraocular muscle movements are intact.  The  neck reveals no discernible elevated JVD.  Mucous membranes are moist.  No oral thrush.  LUNGS:  The lungs reveal bibasilar crackles with prolonged expiratory  rhonchi.  HEART:  Cardiovascular - S1 and S2, regular.  ABDOMEN:  The abdomen is obese, soft and nontender.  Bowel sounds are  present.  No palpable organomegaly.  EXTREMITIES:  The extremities show 1+ pitting pedal edema.  No cyanosis.  No calf tenderness.  Dorsalis pedis pulses are palpable bilaterally.  NEUROLOGIC EXAMINATION:  CNS; no focal neurological deficit.  SKIN:  The patient's skin reveals no rash or petechiae.  She has  prominent anterior abdominal wall veins.   LABORATORY DATA:  The initial laboratory data revealed; BNP 339.  Sodium  142, potassium 3.2, chloride 102, CO2 30, glucose 148, BUN 4, and  creatinine 0.52.  Total  bilirubin 0.4, AST 10, ALT 11, alkaline  phosphatase 71, total protein 5.4, albumin 2.6, and calcium 8.2.  PTT  less than __________ ,  PT/INR 13.8/1.0.  White cells 12.3, hemoglobin  9.0, hematocrit 26.9 and platelets 416,000 with an absolute neutrophil  count of 9.8.  Fecal occult blood was negative.  Chest x-ray shows  cardiomegaly with interstitial pulmonary edema  and bilateral lower lung  atelectasis/air space disease with small bilateral pleural effusions.  Pneumonia is not entirely excluded.  EKG is not available.   ASSESSMENT AND PLAN:  The patient is a 70 year old Caucasian female  presenting with history of cough, shortness of breath with subjective  fever and chills.  She has mild leukocytosis of 13,000.  Chest x-ray is  consistent with interstitial pulmonary edema and probable pneumonia.  She has responded nicely to 80 mg of IV Lasix.  She will be admitted for  further evaluation and treatment.   ADMISSION DIAGNOSIS:  1. Congestive heart failure.  This is apparently a new diagnosis.  We      will continue Lasix at 40 mg intravenously every 12 hours with      potassium supplement 20 mEq daily.  We will place her on strict Is      and Os, fluid restriction of 1,200 mL and 2-gram sodium diet.  We      will weigh her daily and cycle cardiac enzymes.  Check two-      dimensional echocardiogram to assess the ejection fraction and left      ventricular function.  Check thyroid stimulating hormone and      electrocardiogram.  2. Probable underlying pneumonia.  Of note, is that the patient was      hospitalized about 2 weeks ago and treated for pneumonia.  Sputum      culture at that time on Dec 25, 2007 grew 3,000 colonies oxacillin      sensitive staphylococcal aureus.  She received Zosyn during that      hospitalization and was transitioned to doxycycline on discharge.      Her symptoms are quite subjective for pneumonia; however, since we      cannot entirely exclude pneumonia  in this patient we will start      empiric antibiotic with Zosyn per pharmacy and repeat chest x-ray      tomorrow (after diuresis), posterior-anterior and lateral.  If this      excludes pneumonia we will then discontinue Zosyn.  3. Normocytic anemia.  This is probably multifactorial or most likely      from recent the hospitalization, multiple blood draws and      infection.  Stool is heme negative.  We will check an anemia panel      and monitor hemoglobin during this hospitalization.  4. History of asthma/chronic obstructive pulmonary disease.  We will      continue Advair, Spiriva and Combivent as needed.  5. Hypokalemia.  We will replete her potassium.  6. Hyperglycemia.  She is not a known diabetic.  Random blood sugar is      148.  We will check hemoglobin A-1-C and fasting blood sugar in the      A.M.  7. Deconditioned.  We will continue physical therapy and occupational      therapy while she is in the hospital.      Mobolaji B. Corky Downs, M.D.  Electronically Signed     MBB/MEDQ  D:  01/11/2008  T:  01/11/2008  Job:  [id number]   cc:   Dr. [name]

## 2010-12-23 NOTE — Discharge Summary (Signed)
NAME:  [name] [name], [name] [name]               ACCOUNT NO.:  [id number]   MEDICAL RECORD NO.:  [id number]          PATIENT TYPE:  INP   LOCATION:  3016                         FACILITY:  [facility]   PHYSICIAN:  Oley Balm. Sung Amabile, MD   DATE OF BIRTH:  1941-04-28   DATE OF ADMISSION:  12/25/2007  DATE OF DISCHARGE:  12/30/2007                               DISCHARGE SUMMARY   ADMISSION DIAGNOSES:  1. Altered mental status - unclear etiology.  2. Hypercapnic respiratory failure.  3. Ventilator dependent respiratory failure.  4. History of hypertension.  5. History of coronary artery disease.  6. Lactic acidosis.   DISCHARGE DIAGNOSES:  1. Altered mental status - resolved.  2. Hypercapnic respiratory failure - resolved.  3. Ventilator dependent respiratory failure - resolved.  4. History of hypertension - controlled.  5. History of coronary artery disease - inactive.  6. Lactic acidosis - resolved.  7. Acute chronic obstructive pulmonary disease exacerbation.   HISTORY OF PRESENT ILLNESS:  Please refer to the admission history and  physical for this patient's initial presentation.  Briefly, she is a 70-  year-old woman who resides at [facility] assisted living.  She was  admitted by Dr. Delton Coombes with the following history:  On the day of  admission she fell and hit the back of her head.  It is unclear whether  there was any loss of consciousness at that time.  She had been  evaluated and did not appear to have any altered mental status or  neurologic deficits at that time.  However she did have headache.  Approximately 8 hours later she was found by the staff at [facility] to be unresponsive with agonal respirations.  EMS was  activated and upon their arrival she was noted to be hypoxemic but did  have a pulse.  She received bag-mask ventilation and was brought to the  emergency department at [facility] where she was  intubated for altered mental status and hypercarbic  respiratory failure.  In the emergency department she was febrile with a temperature of 102.3.  CT scan of the head was performed which demonstrated no abnormalities.  Critical care medicine was asked to admit her because of her ventilator  dependent status.  She was admitted with the above diagnoses and treated  with mechanical ventilation, bronchodilators, empiric antibiotics,  intravenous fluids and the usual ICU prophylaxes for DVT and stress  ulcer.   HOSPITAL COURSE:  On the day following admission she passed a  spontaneous breathing trial and was successfully extubated.  The  following 24 hours were uneventful and she was transferred out of the  intensive care unit Dec 27, 2007, to a step-down unit bed.  Her  antibiotics were simplified to doxycycline.  It is notable that she has  a reported reaction to Avelox which was the initial antibiotic  selection.  Subsequent culture data did demonstrate that she grew out a  methicillin resistant Staphylococcus aureus out of her respiratory  cultures.  The following 2 days she was transitioned to a regular  hospital room and physical therapy interventions  were undertaken.  She  had no further complications during the hospitalization.  On the day  prior to discharge it was noted that she had significant wheezing.  Her  bronchodilator regimen has been adjusted accordingly.  It is notable  that the patient informs me that she has had adverse reactions to  prednisone in the past.  Therefore no prednisone has been ordered.  However, due to the wheezing, I have changed her Coreg to metoprolol  which is a more cardioselective beta-blocker.  She is discharged now on  this day in much improved condition and her disposition is back to the  [facility].  It is not clear to me at this time who her  primary physician is and the patient is unable to remember his name.  Therefore, I am unable to direct this discharge summary to her primary   care physician.   DISCHARGE MEDICATIONS:  1. Stop Coreg.  2. Stop Theophylline.  3. Metoprolol XL 25 mg p.o. q. a.m. (new medication).  4. Prevacid 30 mg p.o. q. a.m.  5. Lipid 40 mg p.o. q. a.m.  6. Plavix 75 mg p.o. q. a.m.  7. Lexapro 5 mg p.o. daily at bedtime (unclear whether this represents      a new medication or not).  8. Advair 250/50 one inhalation b.i.d. (new medication).  9. Spiriva one inhalation q. a.m. (new medication).  10.Combivent inhaler 2 inhalations q.4 h p.r.n.  11.Doxycycline 100 mg p.o. b.i.d. x3 more days (new medication).  12.Xanax as needed and as previously ordered.  13.Ultram as needed and as previously ordered.  14.Phenergan as needed and as previously ordered.   DISCHARGE AND PLAN:  She is to arrange for followup with her primary  care physician in 1 week following this discharge.  I have also  scheduled followup appointment with Dr. Marcelyn Bruins of [facility], pulmonary division, on January 17, 2008, and 9:15 a.m.  This  followup is to establish continuity of care for her COPD.      Oley Balm Sung Amabile, MD  Electronically Signed     DBS/MEDQ  D:  12/30/2007  T:  12/30/2007  Job:  [id number]

## 2010-12-23 NOTE — Discharge Summary (Signed)
NAME:  [name] [name], [name] [name]               ACCOUNT NO.:  [id number]   MEDICAL RECORD NO.:  [id number]          PATIENT TYPE:  INP   LOCATION:  1420                         [facility]ACILITY:  [facility]   PHYSICIAN:  Hettie Holstein, D.O.    DATE OF BIRTH:  Feb 19, 1941   DATE OF ADMISSION:  01/10/2008  DATE OF DISCHARGE:                               DISCHARGE SUMMARY   PRIMARY CARE PHYSICIAN:  Dr. Cheri Rous.  However, while is skilled  nursing facility she is cared for by Dr. Leodis Rains at  [facility].  She was staying at skilled nursing facility,  [facility].   FINAL DIAGNOSES:  1. Facility-acquired pneumonia.  2. Congestive heart failure decompensation with acute-on-chronic      systolic failure.  Her primary cardiologist is Dr. Dulce Sellar.  She has      underlying ischemic cardiomyopathy and coronary disease.  She has      had a stent placed and is on chronic antiplatelet therapy.  3. Iron-deficiency anemia.  She is quite adamant she does not want any      procedures this admission until she resolves her acute episode of      heart failure and pneumonia.  She can pursue gastroenterology      evaluation in the outpatient setting.  She states she does have a      gastroenterologist but at this juncture she cannot recall his name.      She was transfused in October 2008 2 units.  Any event, we gave her      IV course of Venofer for iron deficiency.  Her stool Hemoccults      were negative and her hemoglobin has remained stable at 9.9.  She      can follow up at the skilled facility within a week or so following      discharge to assure stability.  4. Morbid obesity.  5. Vulvovaginal candidiasis.  6. Hypertension.  7. Drug rash felt to be related to quinolone therapy.  She has a      listing of Avelox causing a rash on prior hospitalizations, though      [name] [name] requested and demanded Levaquin causes her no problems.      She was given Levaquin during this course  and it seems she had of      recurrence of drug rash.  Therefore, she was transition back to      Zosyn to complete her course, which she will complete before      discharge.   MEDICATIONS ON TRANSFER:  1. She should have concluded her course of IV antibiotics prior to      discharge.  I am getting a follow-up CBC in the morning to ensure      that this is down-trending, as it initially was elevated.  2. Xanax can be continued at a b.i.d. dose, 0.5 mg p.o. b.i.d.  She      has done fairly well with this dose as an inpatient.  3. Plavix can be continued at 75 mg daily.  4. Lexapro 5 mg q.h.s.  5. Iron sulfate 325 mg p.o. b.i.d.  6. Advair 1 puff b.i.d. as before.  7. At this juncture she has responded well to diuresis and I will      decrease her Lasix dose to 20 mg daily with adjustments to be      undertaken at the discretion of her cardiologist in addition to her      primary facility [name] based on clinical assessment of volume      and the patient's daily weights.  8. Xopenex nebulized q.6h. p.r.n.  9. Lisinopril can be administered at 10 mg daily.  10.Toprol-XL 25 mg daily as before.  11.She was also on Zocor 80 mg daily as before.  12.She is to conclude treatment with miconazole or Monistat Derm      vaginal preparation 100 mg per vagina daily until June 10 with a      refill if yeast infection persists x1.  13.Nystatin or Mycostatin cream can be applied topically daily.   DISPOSITION:  At present, [name] [name] is felt to be in stable condition  for transition to skilled facility after conclusion of her course and  also if over the next 24 hours she develops no complications or  problems.  She should be scheduled for GI follow-up within a week or so  and also follow up with a cardiologist, Dr. Dulce Sellar.  She should continue  undergoing physical therapy at the skilled facility.   HISTORY OF PRESENTING ILLNESS:  For full details, please refer to the  H&P as dictated by Dr.  Corky Downs.  However, briefly, [name] [name] is a  pleasant though anxious 70 year old female hospitalized back in May and  discharged on May 22 for hypercapnic respiratory failure.  She was  treated for pneumonia and the nursing home found her hemoglobin to be  8.7 and sent her back to the emergency department.  In any event, this  has remained unchanged.  There was no evidence of hemoccult positivity.  Iron studies revealed her iron to be low.  She was given Venofer and  oral supplementation with stability of her hemoglobin and not requiring  transfusion this admission.  She declined workup or colonoscopy this  admission and stated that she would pursue this once resolution of her  pneumonia and heart failure in the outpatient setting.  In the emergency  department she was found to have pneumonia as well as some congestive  heart failure.  She has prior history of congestive heart failure with  cardiac catheterizations and stent placement, all placed by Dr. Dulce Sellar.  Her cardiac markers were cycled and she had no evidence of an acute  ischemic event.  She underwent a 2-D echocardiogram that revealed an  ejection fraction of 45%.  As noted above, she developed a drug rash  related to Levaquin which she had maintained that she has never  developed a problem with Levaquin in the past and in fact, preferred  Levaquin stating, Levaquin always helps me.  In any event, she was  replaced back on Zosyn.  Now her clinical status appears to be  improving.  She is quite deconditioned, morbidly obese.  Will likely  discontinue her Foley catheter prior to transfer.      Hettie Holstein, D.O.  Electronically Signed     ESS/MEDQ  D:  01/16/2008  T:  01/16/2008  Job:  [id number]   cc:   Cheri Rous  Fax: [phone]   Samara Snide, MD   Norman Herrlich, MD

## 2010-12-23 NOTE — H&P (Signed)
NAME:  [name] [name], [name] [name] NO.:  [id number]   MEDICAL RECORD NO.:  [id number]          PATIENT TYPE:  INP   LOCATION:  2110                         FACILITY:  [facility]   PHYSICIAN:  Leslye Peer, MD    DATE OF BIRTH:  [birth year]   DATE OF ADMISSION:  12/25/2007  DATE OF DISCHARGE:                              HISTORY & PHYSICAL   CHIEF COMPLAINT:  Acute respiratory failure and altered mental status.   BRIEF HISTORY:  [name] [name] is a 70 year old woman with a history of  coronary artery disease, MI, COPD, lumbar stenosis with chronic back  pain, and narcotics use.  She resides at [facility].  Per  report, she fell today at approximately 10:00 a.m. and hit her head.  It  is unclear as to whether she tripped or had a presyncopal episode.  She  was ambulatory and responsive without any neurological deficits after  the event.  She did complain of some head pain.  Later at approximately  1800 hours, she was found by a staff in her bed to be agonal and  unresponsive.  EMS was activated and she was found to be hypoxic with a  perfusing rhythm.  She received bag-mask ventilation and was brought to  the emergency department, where she was intubated successfully.  Since  intubation, her mental status has improved and she has begun to wake up.  In the emergency department, she is febrile with a temperature of 102.3  with a leukocytosis.  A head CT was performed that showed no acute  abnormalities and in particular no evidence of trauma or bleed.  She  also has a CT scan of her cervical spine that was negative for any acute  changes.  Critical care medicine was called for further management.   PAST MEDICAL HISTORY:  1. Coronary artery disease status post MI.  It is not clear to me when      she had her MI.  She is on Plavix.  2. Lumbar stenosis with chronic back pain.  3. Gastroesophageal reflux disease.  4. Hypertension.  5. Depression.  6. Chronic obstructive  pulmonary disease.  7. Cataracts.  8. Obesity.   ALLERGIES:  1. CODEINE.  2. COMPAZINE.  3. REGLAN.  4. SULFA.   MEDICATIONS:  1. Theophylline 100 mg daily.  2. Plavix 75 mg daily.  3. Spiriva 1 inhalation daily.  4. Omeprazole 20 mg daily.  5. Lexapro 5 mg nightly.  6. Coreg 6.25 mg b.i.d.  7. Simvastatin 80 mg daily.  8. Hydrochlorothiazide 25 mg daily.  9. Albuterol 2 puffs q.4 h p.r.n. for shortness of breath.  10.Xanax p.r.n.  11.Ultram p.r.n.  12.Oxycodone p.r.n.   SOCIAL HISTORY:  The patient lives at the [facility].  Per  reports she continues to smoke.  It is unclear to me how much she  smokes.  However, family history is significant for diabetes and COPD.   PHYSICAL EXAMINATION:  GENERAL:  This is an obese, intubated woman who  is awake now and is following commands.  VITAL SIGNS:  Temperature 102.3, blood  pressure 109/55, heart rate 86,  SPO2 98% on vent settings of PRBC, tidal volume 600, respiratory rate 12  with an actual rate of 19, FIO2 0.4, and a PEEP of 5.  HEENT:  The endotracheal tube is in place.  Her pupils are equal, round,  and reactive to light.  They are about 4-5 mm and symmetrical.  Oropharynx is moist.  LUNGS:  Her lungs are clear bilaterally without any significant  crackles.  She does have a prolonged expiratory phase and mild bilateral  end-expiratory wheezes.  HEART:  Regular without murmur, rub, or gallop.  ABDOMEN:  Obese, soft with hypoactive bowel sounds.  Nontender to deep  palpation.  EXTREMITIES:  No cyanosis, clubbing, or edema.  She does have cool scan.  NEUROLOGICAL:  She is a bit agitated.  She tracks, follows commands.  She has good strength in the bilateral upper and lower extremities.   LABORATORY EVALUATION:  ABG immediately post intubation was  7.21/81/567/32.  Follow-up ABG was 7.31/64/103/32.  Lactic acid 3.6.  Urinalysis, specific gravity 1.013, pH 6.0, glucose 100, leukocyte  esterase negative, nitrite  negative, 0-2 white blood cells per high-  power field.  Sodium 133, potassium 5.2, chloride 94, CO2 22, BUN 9,  creatinine 0.8, glucose 333 calcium 8.4, total bilirubin 0.6, alkaline  phosphatase 87, AST 42, ALT 16, and troponin less than 0.05 with CK-MB  of 2.1, PTT 24, PT 13.1 with an INR of 1.0.  White blood cell count was  25.6 with a differential of 90% neutrophils, 8% lymphocytes, and 1%  monocytes.  Hematocrit 37.5 and platelets 455.  Chest x-ray in the  emergency department showed bilateral interstitial prominence with left  greater than right evolving infiltrate.  The endotracheal tube was in  good position.  Head and C-spine CTs were as described above.   IMPRESSION AND PLAN:  1. Altered mental status.  It is not clear to me whether this was the      cause of or the result of her respiratory failure.  Etiology is not      clear.  She is status post a fall at the nursing home but her head      CT is reassuring.  Again, it is not clear whether the fall was the      cause of her decompensation or a result of her illness.  She was      hypercapnic on presentation and she appears to have evidence of      sores and evolving sepsis, which could be a cause for both her fall      and her altered mental status.  I will continue a low-dose sedation      protocol and follow her in the morning for wake up.  If she is not      waking up at that time, then I believe she may have to repeat CT      scan of the head to ensure that she is not having any late evolving      process status post her trauma including a bleed or possible CVA.      I will check a urine drug screen.  2. Ventilator-dependent respiratory failure with hypercapnic      respiratory failure.  She does not have any significant wheezing on      exam at this time.  Her chest x-ray could represent an evolving      left lower lobe infiltrate and we will follow her ABG.  Bronchodilators will be initiated, but I will not order       corticosteroids at this time.  We will perform pancultures and      bronchoalveolar lavage including viral culture and rapid flu.  She      has already received empiric vancomycin, ampicillin-tazobactam.  I      will add azithromycin to this regimen.  I will send her for wake up      and spontaneous breathing trial in the morning.  Finally we will      check the theophylline level.  3. Hypertension.  I will hold her Coreg and hydrochlorothiazide at      this time, given marginal blood pressure and we will add these back      if she remains stable in the morning.  4. Coronary artery disease.  I will hold her Plavix in the event that      she needs central line placement.  5. Lactic acidosis.  This could be related work of breathing, but I      suspect that she had a hypoperfusion state either due to her      respiratory arrest or to her sepsis.  I will follow her lactic      acid.  Her abdomen is soft, but we may need to consider a CT scan      of the abdomen and pelvis, if she decompensates to look for an      acute source of infection.  6. Prophylaxis will be with a proton pump inhibitor and heparin      subcutaneously.      Leslye Peer, MD  Electronically Signed     RSB/MEDQ  D:  12/25/2007  T:  12/26/2007  Job:  [id number]

## 2010-12-23 NOTE — H&P (Signed)
NAME:  [name] [name], [name] [name]               ACCOUNT NO.:  [id number]   MEDICAL RECORD NO.:  [id number]          PATIENT TYPE:  INP   LOCATION:  1439                         FACILITY:  [facility]   PHYSICIAN:  Michiel Cowboy, MDDATE OF BIRTH:  [birth year]   DATE OF ADMISSION:  06/05/2008  DATE OF DISCHARGE:                              HISTORY & PHYSICAL   CHIEF COMPLAINT:  Nausea and vomiting and overall not feeling well.   HISTORY OF PRESENT ILLNESS:  The patient is a 70 year old female with  history of medical noncompliance, COPD, coronary artery disease,  hypertension, GERD, and self-reported early diabetes.  The patient, for  past one week, has been having problems with nausea and vomiting.  She  initially started with nausea, thought that it was somehow related to  yeast infection, went to walk-in clinic and got a prescription for  fluconazole which she took, says that did not quite help her yeast  infection, but she had persisted worsening in her nausea and vomiting  and still has a lot of a burning and itching in the vaginal area.   The patient denies any chest pain, shortness of breath.  She has not  been able to eat well, but does states that when she eats a little bit,  it does seem to help somewhat with her nausea.  Otherwise, she is  starting to feel generalized weakness which brought her into the  emergency department.  She was not able to check her temperature, but  unsure if she had any fever at home.  No diarrhea.  Otherwise, review of  systems unremarkable.   PAST MEDICAL HISTORY:  1. History of lumbosacral degenerative joint disease.  2. Hiatal hernia.  3. COPD.  4. Hypertension.  5. 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  6. Chronic sinusitis.  7. Depression.  8. Coronary artery disease.  9. Self-reported diabetes for which she is currently not taking any      medications apparently.   SOCIAL HISTORY:  The patient continues to smoke, does not  drink, does  not use drugs.  Lives at home.  Has a girlfriend who helps her out.   FAMILY HISTORY:  Noncontributory.   ALLERGIES:  The patient reportedly allergic to codeine and sulfa.   MEDICATIONS:  The patient is supposed to be on quite an extensive list  of medication, but currently does not take anything except for  theophylline 100 mg once a day, Advair as needed instead of scheduled  and as needed albuterol.  Otherwise, she does not take the rest of her  medications.  Discharge summary which was on October 2, the patient was  supposed to be taking:  1. Theophylline 100 mg daily.  2. Omeprazole 30 mg daily.  3. Zocor 80 daily.  4. Spiriva.  5. Alprazolam is 0.5 as needed q.h.s.  6. Coreg 6.25 daily.  7. Albuterol as needed.  8. MiraLax as needed.  9. Darvocet as needed.  10.Flexor 10 b.i.d.  11.Medrol Dosepak.  12.Plavix was supposed to be started on May 17, 2008.   PHYSICAL EXAMINATION:  VITALS:  Temperature  99.3, blood pressure 58/74,  pulse 80, respirations 20, saturating 100% on room air.  GENERAL:  Patient currently appears to be in no acute distress, but does  appear generally weak.  HEENT:  Head nontraumatic.  Dry mucous membranes.  LUNGS:  Distant breath sounds noted.  Occasional wheezes at the bases.  HEART:  Regular rate and rhythm.  No murmurs, rubs, gallops.  ABDOMEN:  Soft but diffusely tender.  LOWER EXTREMITIES:  Without clubbing, cyanosis, no edema.  Somewhat  decreased skin turgor.  NEUROLOGICAL:  Cranial II-XII intact.  Strength 5/5 in all for  extremities, but overall feels weak.   LABORATORY DATA:  White blood cell count 16.8.  Per review of records,  the patient has had elevated white blood cell count the past.  Hemoglobin 14.1, sodium 140, potassium 3.6, creatinine 0.5.  __________  lipase 12.  UA within normal limits. KUB, chest x-ray all within normal  limits.  CT scan of the abdomen pelvis without p.o. contrast shows  sigmoid  diverticulosis with no evidence of diverticulitis.   ASSESSMENT/PLAN:  1. This is a 70 year old female, noncompliance,  history of COPD,      continues to smoke, history of coronary artery disease, currently      not taking any of her medications.  Presents with nausea and      vomiting for about a week of unclear etiology.  Differential      includes viral gastroenteritis, less likely given that there are no      other GI symptoms.  CT scan showed diverticulosis, but no      diverticulitis.  No cause for this particular nausea, vomiting.      She could have hypertension and gastroparesis, especially if she      had been diagnosed with diabetes, but I would expect longstanding      history of diabetes before, so would develop that.  Lipase within      normal limits.  No evidence of pancreatitis in a CT scan.  Other      possibilities could be central nausea and vomiting.  The patient      does endorse some headaches.  Will obtain CT of her head to at      least start the workup.  If nausea and vomiting persists, then we      need further evaluation.  For right now, will treat symptomatically      with Zofran and Reglan, give IV fluids for orthostatics.  Gentle IV      hydration as the patient does appear to have CHF.  Since this has      been going on for about a week, I would get one case of cardiac      enzymes, but since she does not have any chest pain or shortness of      breath, there is no need to cycle them.  2. History of COPD.  The patient was instructed to quit smoking.      Continue her Spiriva, make sure her Advair is scheduled.  I Wonder      if she is actually still taking steroids, and that is the reason      for elevation of white blood cell count, but will have to find out.      The patient does not report history of COPD, but does report      history of asthma.  For right now, would obtain theophylline level      to  make sure it is not toxic.  If okay would continue.   3. Hypertension.  Restart Coreg.  Put on hydrochlorothiazide as      patient is now dehydrated.  4. History of coronary artery disease.  Will admit to telemetry.      Continue Zocor, Plavix as she was supposed to be taking it, Coreg.  5. Anxiety disorder.  Make sure that the patient can take, has her      alprazolam p.r.n..  6. GERD.  Will continue Protonix.  7. Prophylaxis:  Protonix and SCDs.  A CT scan of the head is      negative.  Would start Lovenox and prophylaxis.  8. Persistent vaginitis.  Would try now a dose of Diflucan, if no      improvement, may need just to get cultures or      any combination of topical treatment. At this point, I am unable to      perform a thorough vaginal exam as the patient does not have      appropriate privacy and unable to ambulate well.  May try to repeat      that in the morning once moved to her private room.      Michiel Cowboy, MD  Electronically Signed     AVD/MEDQ  D:  06/05/2008  T:  06/05/2008  Job:  [id number]   cc:   Deirdre Peer. Polite, M.D.

## 2010-12-23 NOTE — Discharge Summary (Signed)
NAME:  [name] [name], [name] [name]               ACCOUNT NO.:  [id number]   MEDICAL RECORD NO.:  [id number]          PATIENT TYPE:  INP   LOCATION:  1441                         [facility]ACILITY:  [facility]   PHYSICIAN:  Isidor Holts, M.D.  DATE OF BIRTH:  1940/09/27   DATE OF ADMISSION:  05/06/2008  DATE OF DISCHARGE:  05/11/2008                               DISCHARGE SUMMARY   PRIMARY MEDICAL DOCTOR:  Samara Snide, MD.   DISCHARGE DIAGNOSES:  1. Multilevel lumbosacral degenerative joint disease/multifactorial      stenosis and left L3-L4 radiculopathy.  2. Exacerbation of chronic low back pain/radiculopathy, status post      fall.  3. Smoking history.  4. Hiatal hernia/gastroesophageal reflux disease.  5. Chronic obstructive pulmonary disease.  6. Hypertension.  7. History of congestive cardiomyopathy, ejection fraction 45%.  8. Chronic sinusitis.  9. Depression.  10.Status post tonsillectomy.  11.Status post cholecystectomy.  12.Status post total abdominal hysterectomy.  13.Coronary artery disease.  14.Dyslipidemia.   DISCHARGE MEDICATIONS:  1. Theophylline 100 mg p.o. daily.  2. Omeprazole 30 mg p.o. daily.  3. Zocor 80 mg p.o. q.h.s.  4. Spiriva HandiHaler 1 cupful daily.  5. Alprazolam 0.5 mg p.o. q.h.s. (was on 1 mg p.o. t.i.d.)  6. Coreg 6.25 mg p.o. b.i.d.  7. Hydrochlorothiazide 25 mg p.o. daily.  8. Albuterol MDI 2 puffs p.r.n.  9. MiraLax 17 gm p.o. p.r.n. daily.  10.Darvocet-N 100 one p.o. q.i.d.Marland Kitchen  11.Flexeril 10 mg p.o. b.i.d..  12.Medrol Dosepak, use as directed.  13.Plavix 75 mg p.o. daily to be commenced from May 17, 2008 (i.e.      2 days after completion of epidural steroid injection).   Note:  Hydrochlorothiazide, Phenergan, Oxycodone, Ultram and Ampicillin,  have all been discontinued.   PROCEDURES:  1. Chest x-ray dated May 04, 2008.  This showed chronic      bronchial thickening and interstitial prominence.  No acute      interval  change.  2. Lumbar spine x-ray dated May 06, 2008.  This showed lumbar      degenerative disk disease and scoliosis at L5-S1 grade 1      anterolisthesis, stable.  3. X-ray left hip dated May 06, 2008.  This showed no acute      finding.  There was osteopenia.  4. CT scan of lumbar spine dated May 07, 2008.  This showed no      sign of fracture or traumatic malalignment, scoliosis convex to the      right, moderate multifactorial stenosis at L3-L4 with potential for      neural compression in the lateral recesses.  Mild narrowing of the      lateral recesses at L4-L5 primarily on the basis of facet      degeneration and hypertrophy.  No definite neural compression.      Previous posterior fusion at L5-S1, anterolisthesis of 7 mm without      definite neural compression.  5. MRI of lumbar spine dated May 09, 2008.  This showed      multifactorial stenosis at L3-L4, left greater than right  associated with left-sided neural foraminal narrowing and lateral      recess encroachment, 7 mm anterolisthesis L5-S1 associated with      advanced disk space narrowing and end-plate reactive changes.  No      definite compressive lesion at this level.  Lower lumbar facet      arthropathy fairly advanced at L4-L5.  Again, noncompressive.  Also      subcutaneous midline fluid collection roughly 3.3 x 6 cm, question  contusion/seroma.  1. MRI left hip dated May 09, 2008.  This was negative for      fracture, occult or serious injury.  Musculature is normal.   CONSULTATIONS:  1. Kerrin Champagne, M.D., orthopedic surgeon.  2. Art A. Hoss, M.D., interventional radiologist.   ADMISSION HISTORY:  As in H&P notes of May 06, 2008 dictated by  Donalynn Furlong, MD.  However, in brief, this is a 70 year old female,  with known history of morbid obesity, congestive cardiomyopathy,  ejection fraction 45%, chronic sinusitis, depression, hypertension,  cervical degenerative  disk disease/multilevel level lumbar DJD, chronic  back pain, status post left shoulder surgery status post tonsillectomy,  status post cholecystectomy, status post abdominal hysterectomy, status  post cataract surgery, coronary artery disease, dyslipidemia, COPD,  GERD, and hiatal hernia, presenting following a fall approximately 10  days prior, with exacerbation of low back pain and pain radiating down  her left leg, associated with difficulty in ambulation and  weightbearing.  She was admitted for further evaluation, investigation  and management.   CLINICAL COURSE:  1. Multilevel lumbosacral degenerative joint disease with left      radiculopathy.  For details of presentation, refer to admission      history above.  Clearly, the patient has preexistent severe      degenerative joint disease and chronic back pain.  This was      exacerbated following the patient's fall.  Imaging studies showed      no evidence of fracture.  However, confirmed lumbosacral DJD with      left L3-L4 neuroforaminal compression.  She was managed with      analgesics, PT/OT.  Orthopedic consultation was kindly provided by      Dr. Otelia Sergeant, who has recommended epidural steroid injection.      Interventional radiology consultation was kindly provided by Dr.      Jolaine Click, who has evaluated the patient, established that      epidural steroid injection was indeed feasible, and has scheduled      to have this done on an outpatient basis, following a Plavix      holiday.   1. Hypertension.  The patient's blood pressure remained normotensive      throughout the course of this hospitalization.   1. History of cardiomyopathy.  There were no problems referable to      this.  No clinical evidence of congestive heart failure.   1. Chronic obstructive pulmonary disease.  The patient remained      asymptomatic from this viewpoint.   1. Dyslipidemia.  The patient continues on pre-admission dosage of       statin.   1. Gastroesophageal reflux disease/hiatal hernia.  This was managed      with proton pump inhibitor.   DISPOSITION:  The patient was on May 11, 2008, considered clinically  stable for discharge to a nursing facility for rehabilitation.  She was  therefore discharged accordingly.   DIET:  Heart healthy.   ACTIVITY:  As  tolerated, otherwise per PT/OT.   FOLLOW UP:  1. The patient is scheduled to undergo an epidural spinal injection on      May 15, 2008.  This has already been arranged and will be      performed by interventional radiologist, Dr. Jolaine Click.  2. She is also scheduled to follow up with Dr. Otelia Sergeant, orthopedic      surgeon 10 days after the epidural steroid injection, i.e. on      May 25, 2008.  Telephone number [phone].  3. Meanwhile, the patient will continue to follow up with nursing      facility MD.  She is recommended to continue PT/OT/rehab in the      nursing facility environment.      Isidor Holts, M.D.  Electronically Signed     CO/MEDQ  D:  05/11/2008  T:  05/11/2008  Job:  [id number]   cc:   Samara Snide, MD   Kerrin Champagne, M.D.  Fax: [phone]

## 2010-12-23 NOTE — H&P (Signed)
NAME:  [name] [name], [name] [name]               ACCOUNT NO.:  [id number]   MEDICAL RECORD NO.:  [id number]          PATIENT TYPE:  INP   LOCATION:  0104                         FACILITY:  [facility]   PHYSICIAN:  Donalynn Furlong, MD      DATE OF BIRTH:  [birth year]   DATE OF ADMISSION:  05/06/2008  DATE OF DISCHARGE:                              HISTORY & PHYSICAL   PRIORITY ADMISSION HISTORY AND PHYSICAL   PRIMARY CARE [name] [name]:  [name] Sharps, NP, with [name] [name].   CHIEF COMPLAINT:  Status post fall and intractable low back pain.   HISTORY OF PRESENTING ILLNESS:  [name] [name] is a 70 year old female living  in [place], [place].  She had a fall about 10 days ago.  She  fell while working in her kitchen.  She fell on her back, hurting her  back.  She did not hit her head very badly, but she did hit her back in  the lower back area.  Since then, she started having low back pain,  which is getting worse.  She visited [facility] and they provided her  Tylenol, Darvocet, and she was discharged back home.  The pain  medication did not help her.  She decided to come here because of her  worsening pain and radiational pain in the left lower extremity.  She  denied any fever, chills, nausea, vomiting, diarrhea, abdominal pain,  urinary complaints, chest pain, shortness of breath, cough, sputum  production, visual changes, hearing changes, or leg swelling at this  time.  She lives by herself.   PAST MEDICAL HISTORY:  Significant for:  1. CHF with ejection fraction of 45%.  2. Chronic sinusitis.  3. Depression.  4. Hypertension.  5. Cervical degenerative disk disease and lumbar spinal stenosis with      chronic back pain.  6. Left shoulder surgery.  7. Tonsillectomy.  8. Cholecystectomy.  9. Total abdominal hysterectomy.  10.Cataract surgery.  11.Coronary artery disease.  12.Hyperlipidemia.  13.COPD.  14.Gastroesophageal reflux disease.  15.Hiatal hernia.   FAMILY HISTORY:  Positive  for cancer, diabetes.   SOCIAL HISTORY:  The patient lives by herself.  No recent alcohol, drug,  tobacco use.  Patient has a chronic tobacco use in the past.   REVIEW OF SYSTEMS:  Positive for systems as described in the presenting  illness.  Otherwise, 14 systems negative.   ALLERGIES:  1. SULFA.  2. PROCHLORPEMAZINE.  3. METOCLOPRAMIDE.  4. AVELOX.  5. CODEINE.   HOME MEDICATION LIST:  Includes:  1. Theophylline 100 mg once a day.  2. Omeprazole 30 mg once a day.  3. Simvastatin 80 mg once a day.  4. Spiriva HandiHaler 18 mcg as needed.  5. Plavix 75 mg once a day.  6. Alprazolam 1 mg 3 times a day.  7. Coreg 6.25 mg twice a day.  8. Hydrochlorothiazide 25 mg once a day.  9. Phenergan 25 mg oral as needed.  10.Albuterol inhaler 0.83 mg as needed.  11.Polyethylene glycol 17 g as needed.  12.Ultram 50 mg as needed.  13.Oxycodone 5 mg as needed.  14.Ampicillin.   PHYSICAL EXAMINATION:  VITAL SIGNS:  Blood pressure 140/80, pulse 79,  respirations 18, temperature 99.9, oxygen saturation 95% on room air.  GENERAL:  Alert, oriented x3.  Patient is shaking.  Patient is lying in  bed.  Her shaking is due to the tremor.  She denied any chills.  CARDIOVASCULAR:  S1 S2 are regular, no murmur, rub, gallop.  HEAD:  Normocephalic and nontraumatic.  EYES:  Pupils equally reactive to light and accommodation, and  extraocular muscles are intact.  No icterus noted.  ORAL MUCOSA:  The oral mucosa is dry, and no thrush noted.  NECK:  No thyromegaly or JVD.  RESPIRATORY:  No wheezing, rhonchi, crackles, bilaterally good air  entry.  ABDOMEN:  Obese, mild tenderness all over the abdomen on superficial  palpation.  The patient did not let me do deep palpation due to her  abdominal tenderness all over the abdomen.  EXTREMITIES:  No clubbing, cyanosis, edema, pulses 5/5 in all 4  extremities.  BACK:  Tenderness over her left paraspinal region and over the  lumbosacral area over the spine.   SKIN:  No rash or bruises.  NEUROLOGICAL:  Intact cranial nerves, muscular strength, sensation, and  reflexes.   Urinalysis unremarkable for any infection.  Comprehensive metabolic  panel unremarkable except hypokalemia at 2.8 and glucose 121.  CBC with  differential shows a WBC 14.8.  Bilateral hip x-ray unremarkable for any  fractures.  Lower lumbar spine showing degenerative disk disease,  scoliosis, and L5-S1, grade 1, NPD atelectasis, stable.  Chest x-ray  showing chronic bronchial thickening, interstitial prominence, no acute  interval change.   ASSESSMENT AND PLAN:  1. Status post fall, deconditioning, and worsening back pain.  2. History of lower lumbar spinal stenosis status post fall with      worsening back pain.  3. Hypokalemia.  4. Leukocytosis, reason unknown.  5. Congestive heart failure.  6. Hypertension.  7. Coronary artery disease.  8. Hyperlipidemia.  9. Depression.  10.Hiatal hernia.  11.Cervical degenerative disk disease.   PLAN:  We will admit patient under [facility] Team with a diagnosis of  intractable lower back pain and fall.  We will check vitals and  input/output every 8 hour.  We will check CBC and CMP in the morning.  We will get PT/OT consult for deconditioning.  The patient will need IV  morphine p.r.n. and 2 mg q.4 hour for pain.  We will continue the Xanax  at nighttime.  We will give her breathing treatment with Albuterol and  Ipratropium p.r.n. and every 6 hour.  We will start omeprazole 20 mg  p.o. daily and SCD on legs for prophylaxis.  We will continue home  medications of Plavix, hydrochlorothiazide, Coreg at this time.  We will  give her Phenergan p.r.n. for nausea, vomiting.  We will get CT of lower  lumbar spine to rule out any occult fracture.  Further plan depending on  the above are pending.      Donalynn Furlong, MD  Electronically Signed     TVP/MEDQ  D:  05/06/2008  T:  05/06/2008  Job:  [id number]   cc:   Chales Abrahams [place],  [place].P.

## 2010-12-23 NOTE — Discharge Summary (Signed)
NAME:  [name] [name], [name] [name]               ACCOUNT NO.:  [id number]   MEDICAL RECORD NO.:  [id number]          PATIENT TYPE:  INP   LOCATION:  1526                         [facility]ACILITY:  [facility]   PHYSICIAN:  [name] [name]. Polite, M.D. DATE OF BIRTH:  1941-05-21   DATE OF ADMISSION:  09/12/2007  DATE OF DISCHARGE:  09/16/2007                               DISCHARGE SUMMARY   DISCHARGE DIAGNOSES:  1. Urinary tract infection, culture showing Enterococcus.      Sensitivities pending at the time of dictation.  Being discharged      on ampicillin 250 mg three times a day.  Urine culture needs to be      followed up with sensitivities to see if antibiotic needs      adjusting.  Please note, the patient did have a urinalysis on      January 31 with a negative culture.  2. Low back pain secondary to spinal stenosis.  3. Ambulatory dysfunction.  4. Coronary artery disease, status post myocardial infarction.  5. Hyperlipidemia.  6. Gastroesophageal reflux disease.  7. Asthma.  8. Severe chronic obstructive pulmonary disease.  9. Chronic anxiety.  10.Deconditioning.   DISCHARGE MEDICATIONS:  1. Ampicillin 250 mg t.i.d. x7 days, antibiotic to be adjusted based      on culture and sensitivity from the hospital.  2. Coreg 6.25 mg.  3. Prevacid 30 mg daily.  4. Lipitor 40 mg daily.  5. Theophylline 100 mg daily.  6. Plavix 75 mg daily.  7. Xanax 1 mg t.i.d.  8. Combivent MDI q.4-6h. p.r.n.  9. Hydrochlorothiazide 25 mg daily.  10.Ultram 50 mg b.i.d. p.r.n.  11.Phenergan 25 mg q.6h. p.r.n.   DISPOSITION:  The patient is discharged to a skilled nursing facility.   STUDIES:  Urine culture:  Enterococcus.  Sensitivities pending at the  time of this dictation.  BMET within normal limits except for mild  hypokalemia at 3.1.  TSH 5.4.  UA:  Cloudy, small leukocytes, many  squamous epithelial, 0-3 wbc's.  Urine culture:  As stated, Enterococcus  species.  MRI from January 31:  Transitional vertebra at S1.   No  evidence of discitis or cord/conus compression.  Degenerative changes  with multifactorial moderate L3-4 spinal stenosis.  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   HISTORY OF PRESENT ILLNESS:  A 70 year old female with multiple medical  problems was discharged from the hospital on February 1, returned on  February 2 with complaints of back pain.  On previous hospitalization  the patient was felt to be stable for discharge.  The patient returned  to [place] long and stated that she felt that she could not take care of  herself and may need some assistance.  Admission was deemed necessary  for further evaluation and treatment.  Please see dictated H&P on  September 12, 2007, for further details.   PAST MEDICAL HISTORY:  As stated above.   MEDICATIONS:  As stated above.   SOCIAL HISTORY:  Per admission H&P.   ALLERGIES:  CODEINE, COMPAZINE, REGLAN and SULFA.   HOSPITAL COURSE:  The patient was admitted to a medicine floor bed for  evaluation and treatment and ultimately placement as she felt she was  deconditioned and could not care for herself at home.  The patient had  screening labs.  She did have concerns for vaginitis and she was treated  with Diflucan 150 mg x2 but still had vaginitis symptoms.  A previous  urine culture was negative.  A follow-up urine culture was pending,  which ultimately showed Enterococcus.  Currently she is started on  ampicillin.  Sensitivities are pending.  Antibiotics may need adjustment  based on the culture.  The patient was seen by PT/OT, skilled nursing  facility was recommended.  The patient did not have any complications  during this hospitalization.  She is stable for discharge to a skilled  nursing facility.   DISCHARGE DIAGNOSES:  1. Urinary tract infection, Enterococcus.  2. Chronic obstructive pulmonary disease.  3. Spinal stenosis.   4. Chronic back pain.  5. Deconditioning.  6. Anxiety.  7. Coronary artery disease.  8. Dyslipidemia.  9. Mild hypokalemia, probably secondary to her hydrochlorothiazide.   At this time she is stable for discharge.      [name] [name]. Polite, M.D.  Electronically Signed     RDP/MEDQ  D:  09/16/2007  T:  09/16/2007  Job:  [id number]

## 2010-12-26 NOTE — H&P (Signed)
NAME:  [name] [name], COYNE NO.:  [id number]   MEDICAL RECORD NO.:  [id number]          PATIENT TYPE:  INP   LOCATION:  1503                         FACILITY:  [facility]   PHYSICIAN:  Lucita Ferrara, MD         DATE OF BIRTH:  09/11/1940   DATE OF ADMISSION:  10/14/2006  DATE OF DISCHARGE:                              HISTORY & PHYSICAL   PRIMARY CARE Brexton Sofia:  Lavinia Sharps, N.P., M.S. from [facility].   HISTORY OF PRESENT ILLNESS:  The patient is a 70 year old female with  multiple previous medical problems, who presents with abdominal pain,  N/V  which started about a week ago, progressively getting worse until  she was vomiting (intractable) and unable to keep any p.o. intake down.  The patient presented to the walk-in clinic about 8 days ago, was given  Phenergan for nausea, but the nausea continues.  The patient denies any  diarrhea.  The patient states that she has been trying to abide by a  soft bland diet with crackers, but her attempt did not work.  Otherwise,  review of systems is negative.  She denies any focal neurologic  deficits, chest pain or shortness of breath.  She says that she  generalized abdominal pain, but nothing focal.  No urinary symptoms.  No  fevers or chills.   PAST MEDICAL HISTORY:  1. Hypertension.  2. History of myocardial infarction.  3. Hyperlipidemia.  4. Gastroesophageal reflux disease.   SOCIAL HISTORY:  The patient is a current smoker within the last 12  months, lives alone, no drug abuse, no alcohol.   ALLERGIES:  1. COMPAZINE.  2. REGLAN.  3. SULFA MEDICATIONS.   MEDICATIONS:  1. Carvedilol 6.25 mg once daily.  2. Prevacid 30 mg once daily.  3. Lipitor 40 mg once daily.  4. Phenergan 25 mg as needed.  5. Plavix 75 mg once daily.   PHYSICAL EXAMINATION:  GENERAL:  The patient is moderate distress due to  nausea and vomiting.  As I saw her, she was vomiting nonbilious,  nonbloody abdominal contents.  VITAL SIGNS:  Blood pressure 150/81, pulse is 86, pulse oximetry 100% on  room air.  HEENT:  Normocephalic, atraumatic.  Sclerae anicteric.  Mucous membranes  dry.  PERRLA.  Extraocular muscles intact.  NECK:  Supple.  CARDIOVASCULAR:  S1 and S2, regular rate and rhythm, no murmurs, rubs or  clicks.  ABDOMEN:  Generally soft, diffusely tender to deep palpation, moderately  distended.  No guarding.  No organomegaly.  EXTREMITIES:  No clubbing, cyanosis, or edema.  NEUROLOGIC:  Grossly intact.  Cranial nerves II-XII grossly intact.   LABORATORY DATA:  White count 10.3, hemoglobin 13.4, hematocrit 39.6,  platelets 358,000.  Sodium 140, potassium 3.7, chloride 106, CO2 23,  glucose 120, BUN 13, creatinine 0.56, calcium 9, AST 18, ALT 14.  Urinalysis shows negative results.  lipase negative.   RADIOLOGIC FINDINGS:  Chest x-ray shows no active pulmonary disease,  COPD changes, nonobstructive bowel gas pattern.  CT Abdomen: negative, diverticulosis.   ASSESSMENT AND PLAN:  This is  a 70 year old female with nausea and  vomiting that is progressive, unable to tolerate oral intake, moderately  dehydrated, although BUN and creatinine within normal limits however she  is hemoconcentrated somewhat.  We will go ahead and admit for  intravenous hydration and observation.  For nausea and vomiting, we will  continue Zofran 4 mg intravenously q.6 h. p.r.n.  For pain, we will  continue Dilaudid 2 mg q.4 h. p.r.n. and titrate down pain medication  until she has good relief without pain medications.  We will continue  intravenous hydration with normal saline at 100 mL an hour.  We will  continue the patient nothing-by-mouth.  We will  give her Protonix 40 mg  intravenously daily.  We will continue Ambien 10 mg p.o. p.r.n.  For  hypertension, we will continue carvedilol 6.25 mg p.o. daily.  I have  explained the plan and the procedures of this admission and the patient  understands.  Patient is now  hemodynamicly stable.      Lucita Ferrara, MD  Electronically Signed     RR/MEDQ  D:  10/15/2006  T:  10/15/2006  Job:  [id number]   cc:   Chales Abrahams [place], [place].P.

## 2010-12-26 NOTE — Consult Note (Signed)
NAME:  [name] [name], [name] [name]                         ACCOUNT NO.:  [id number]   MEDICAL RECORD NO.:  [id number]                   PATIENT TYPE:  REC   LOCATION:  TPC                                  FACILITY:  [facility]   PHYSICIAN:  Sondra Come, D.O.                 DATE OF BIRTH:  [birth year]   DATE OF CONSULTATION:  04/19/2002  DATE OF DISCHARGE:                                   CONSULTATION   The patient returns to clinic today for evaluation.  She was last seen on  April 05, 2002 and underwent left L4-5 and L5-S1 facet joint injections for  lumbar facet arthropathy with low back pain.  She also is noted to have  degenerative disk disease of the lumbar spine with mild spinal stenosis as  well.  She states that she only had minimal, if any, improvement following  the facet injections.  Also, in the interim she has been moving her office  and I had written her some restrictions.  However, those have not been  observed by her employer.  She complains of increased back pain today which  is nonradicular.  She rates it as a 10/10 on a subjective scale.  She has  run out of Norco 10 mg which she states was not helping significantly  anyway.  Her function and quality of life indexes have declined.  Her sleep  is poor.  She has also run out of Celebrex and Flexeril and has been taking  those somewhat inconsistently over the past several months anyway.  She  believes the Celebrex was helping her to some degree as well as the  Flexeril.  In general she does not really like taking medications and is  occasionally inconsistent.  I reviewed the health and history form and 14  point review of systems.  No bowel and bladder dysfunction.   PHYSICAL EXAMINATION:  GENERAL:  Healthy appearing female in no acute  distress.  VITAL SIGNS:  Blood pressure 131/51, pulse 77, respirations 20, O2  saturation 98% on room air.  BACK:  The patient does have some difficulty getting up from seated position  secondary to back pain.  She points to her lower lumbar paraspinal regions  as well as her buttocks.  Palpatory examination reveals significant  tenderness to palpation bilateral lumbar paraspinals.  Range of motion is  guarded in all planes.  NEUROLOGIC:  Manual muscle testing is 5/5 bilateral lower extremities.  Sensory examination is intact to light touch bilateral lower extremities.  Muscle stretch reflexes are symmetric bilateral lower extremities.  Straight  leg raise is negative, but causes some increased back pain.  FABER is  negative, but does increase low back pain.   IMPRESSION:  Chronic low back pain, multifactorial with lumbar facet  arthropathy, degenerative disk disease, and mild spinal stenosis.  No  clinical evidence of radiculopathy at this time.   PLAN:  1. Had a long discussion with the patient regarding further treatment     options.  At this point I will reinitiate Celebrex 200 mg one p.o. q.d.     number 30 with one refill.  I will also give the patient an acute pain     pack of Celebrex 400 mg with an additional 200 mg capsule to take today     if needed on top of the 400 mg capsule.  Will also reinitiate Flexeril 10     mg one p.o. q.8h. as needed number 30 with one refill.  She was     previously taking this just at bedtime but I think at this point she can     take it during the day as well.  She notes she will be off work for     approximately five days.  2. Will prescribe Percocet 10/650 mg one-half to one p.o. b.i.d. as needed     for pain not controlled with Celebrex and Flexeril number 50 without     refills.  3. Continue Lidoderm patches.  4. The patient is to return to clinic in two weeks for reevaluation.  Would     consider repeating lumbar medial branch blocks on the left which have     been the only procedure to help.  We discussed this at length.  I am     still uncertain of the physiologic basis as patient did not get any     relief immediately  which I would have expected if her pain was truly     facet mediated; however, she did get greater than one month's worth of     relief with the medial branch blocks using both anesthetic and     corticosteroid.  Will consider repeating these at some point.   The patient was educated on the above findings and recommendations and  understands.  No barriers to communication.                                               Sondra Come, D.O.    JJW/MEDQ  D:  04/19/2002  T:  04/19/2002  Job:  [id number]

## 2010-12-26 NOTE — Consult Note (Signed)
[facility]  Patient:    [name] [name], [name] [name] Visit Number: [id number] MRN: [id number]          Service Type: PMG Location: TPC Attending Physician:  Sondra Come Dictated by:   Sondra Come, D.O. Proc. Date: 12/12/01 Admit Date:  11/24/2001   CC:         Hal T. Stoneking, M.D.   Consultation Report  [name] [name] returns to clinic today as scheduled for reevaluation.  She was last seen on December 02, 2001 at which time she underwent a trial of left lumbar medial branch blocks diagnostically as well as L5 dorsal ramus block.  The patient states that she got no relief initially for the first day, but then noticed significant improvement on the second day and states that she has had less low back pain since.  However, she noted swelling in her lower extremities which she attributed to Bextra and discontinued this.  She also complains of severe right shoulder pain.  I reviewed the health and history form and 14 point review of systems.  Patients pain today is a 7/10 on a subjective scale.  She does still have some low back pain which is worsened with bending and working, but overall improved.  She states that she had a bone scan.  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  She continues to use the lidoderm patch with some improvement in her low back pain as well.  Her response to the lumbar medial branch blocks and dorsal ramus block is inconsistent with the facet joints being the pain generators.  I would have expected [name] [name] to get significant relief initially and then the relief should have worn off with the anesthetic.  I cannot explain her decreased back pain based on the injections.  PHYSICAL EXAMINATION  GENERAL:  Obese female in no acute distress.  VITAL SIGNS:  Blood pressure 148/74, pulse 95, respirations 16, O2 saturation 95% on room air.  BACK:  There is some tenderness to palpation  bilateral lumbar paraspinals.  EXTREMITIES;  Range of motion of the shoulders is full bilaterally with pain on flexion and abduction on the right.  Positive impingement signs include Hawkins, Neer, and empty can tests.  Negative drop arm test.  NEUROLOGIC:  No new neurologic findings in the lower extremities including motor, sensory, and reflexes.  IMPRESSION: 1. Low back pain, improved.  Patient does have degenerative disk disease of    the lumbar spine as well as lumbar facet arthropathy and mild spinal    stenosis without neurogenic claudication. 2. Right rotator cuff syndrome/impingement syndrome.  PLAN: 1. I had a long discussion with [name] [name] regarding treatment options. 2. Initially, she is instructed to start physical therapy for a low back    program as well as right shoulder rotator cuff program. 3. Will prescribe Ultram one to two p.o. t.i.d. as needed #100 with one    refill. 4. Celebrex 200 mg one p.o. q.d. #30 with one refill.  I also gave her a two    week supply of samples.  She is instructed to discontinue this if she    notices any lower extremity swelling. 5. Recommend glucosamine and chondroitin. 6. Recommend ice two to three times per day for 20 minutes to her right    shoulder. 7. Consider right subacromial steroid injection. 8. Patient to return to clinic in two weeks for reevaluation.  Patient  was educated on the above findings and recommendations and understands.  There were no barriers to communication. Dictated by:   Sondra Come, D.O. Attending Physician:  Sondra Come DD:  12/12/01 TD:  12/13/01 Job: [id number]

## 2010-12-26 NOTE — Consult Note (Signed)
[facility]  Patient:    [name] [name], [name] [name] Visit Number: [id number] MRN: [id number]          Service Type: PMG Location: [facility] Attending Physician:  Sondra Come Dictated by:   Sondra Come, D.O. Proc. Date: 12/02/01 Admit Date:  11/24/2001   CC:         Hal T. Stoneking, M.D.  Lilla Shook, M.D.  Cristi Loron, M.D.   Consultation Report  HISTORY OF PRESENT ILLNESS:  [name] [name] returns to clinic today as scheduled for trial of lumbar facet blocks.  Currently [name] [name] complains mainly of left lower back pain significantly greater than right-sided pain.  Her pain today is an 8/10 on a subjective scale.  I review health and history form and 14-point review of systems.  No new neurologic complaints.  The patient continues to take Bextra 20 mg daily, Percocet 5 mg/325 mg one p.o. q.i.d. as needed.  She has stopped taking OxyContin.  PHYSICAL EXAMINATION:  An obese female in no acute distress.  The blood pressure was 137/62, pulse 90, respirations 16, and O2 saturation 95% on room air.  Palpatory examination of the lumbar spine reveals significant tenderness to palpation over the left lumbosacral region.  There is mild tenderness over the right lumbosacral region.  Manual muscle testing is 5/5 in bilateral lower extremities.  The sensory examination is intact to light touch in bilateral lower extremities.  Muscle stretch reflexes are symmetric bilaterally.  IMPRESSION: 1. Lumbar facet arthropathy. 2. Degenerative disk disease of the lumbar spine. 3. 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  PLAN: 1. Left L2, L3, and L4 medial branch blocks, as well as L5 dorsal ramus block.    The procedure was described to the patient in detail, including the risks,    benefits, limitations, and alternatives.  The patient wishes to proceed.    Informed consent was obtained.  The patient was brought back to the  fluoroscopy suite and placed on the table in prone position.  The skin was    prepped and draped in usual sterile fashion.  The skin and subcutaneous    tissues were anesthetized with 2 cc of 1% lidocaine at four independent    needle access points.  Under direct fluoroscopic guidance, a 22 gauge,    3-1/2 inch spinal needle was advanced to the junction of the left S1    superior articular process with the sacral ala, as well as the junctions of    the L3, L4, and L5 superior articular processes with their corresponding    transverse processes.  Once bony contact was made, negative aspiration was    performed and each site was injected with 0.5 cc of preservative-free    0.25% bupivacaine without complication.  The patient tolerated the    procedure well.  Discharge instructions given.  The patient was instructed    to keep a pain log and to bring this with her to the next appointment. 2. The patient is to continue current medications. 3. The patient is to return to the clinic in one week for re-evaluation. 4. Consideration for radiofrequency neural ablation if the patient has    positive response.  The patient was educated on the above findings and recommendations and understands.  There were no barriers to communication. Dictated by:   Sondra Come, D.O. Attending Physician:  Sondra Come DD:  12/02/01 TD:  12/03/01 Job: [id number]

## 2010-12-26 NOTE — Consult Note (Signed)
NAME:  [name] [name], [name] [name]                         ACCOUNT NO.:  [id number]   MEDICAL RECORD NO.:  [id number]                   PATIENT TYPE:  REC   LOCATION:  [facility]                                  FACILITY:  [facility]   PHYSICIAN:  Zachary George, DO                      DATE OF BIRTH:  [birth year]   DATE OF CONSULTATION:  08/01/2002  DATE OF DISCHARGE:  05/09/2002                                   CONSULTATION   HISTORY OF PRESENT ILLNESS:  [name] [name] returns to clinic today for  reevaluation. She was last seen on 07/05/02. At that time, I started her on  Ultracet for her lower back pain in attempts to get her off of narcotic-  based pain medication. She states that her low back pain is still very  severe, and she has not noticed any relief with Ultracet, taking two at a  time up to twice a day. Her pain is still 8/10 on a subjective scale. She  has started using neuromuscular stimulator which has been very helpful  during the course of its use as well as three to four hours post use. She,  however, is unable to use it twice a day secondary to her work schedule and  typically uses it night time. Previously, she had significant relief of her  lower back pain with left medial branch blocks, and we have been in  discussion of performing radiofrequency neurotomy to the left lumbar medial  branches; however, I had wanted the patient to get involved in a physical  therapy program which was put on hold last month secondary to bronchitis.  The patient has started her physical therapy, and I talked to her about  moving forward with the radiofrequency neurotomy procedure which could give  her 30 to 60% relief of her lower back pain and decrease her need for  chronic medications. She wishes to proceed in this regard. She denies any  radicular symptoms, denies any bowel or bladder dysfunction. I reviewed the  health and history form and 14-point review of systems.   PHYSICAL EXAMINATION:  Reveals a healthy  female in no acute distress. Blood  pressure 160/64, pulse 102, respirations 20, O2 saturations 97% on room air.  Examination of the back reveals significant tenderness to palpation in the  left lumbar paraspinal muscles. Manual muscle testing is 5/5 bilateral lower  extremities. Sensory exam is intact to light touch bilateral lower  extremities. Muscle stretch reflexes are symmetric bilateral lower  extremities.   IMPRESSION:  1. Lumbar facet syndrome secondary to lumbar facet arthropathy, left.  2. Degenerative disk disease of the lumbar spine.   PLAN:  1. Continue physical therapy.  2. Will set patient up for radiofrequency neurotomy of the left lumbar     medial branches. I discussed the risks, benefits, limitations, and     alternatives with her, and she  wishes to proceed.  3. Discontinue Ultracet and resume Norco 10 mg/325 mg one p.o. b.i.d. to     t.i.d. #60 without refills.  4. Continue Bextra 20 mg one p.o. q.d. #30 with one refill.  5. The patient will return to clinic for radiofrequency procedure.   The patient was educated about findings and recommendations and understands.  There were no barriers to communication.                                               Zachary George, DO    JW/MEDQ  D:  08/01/2002  T:  08/02/2002  Job:  [id number]

## 2010-12-26 NOTE — Consult Note (Signed)
[facility]  Patient:    [name] [name], [name] [name] Visit Number: [id number] MRN: [id number]          Service Type: PMG Location: [facility] Attending Physician:  Sondra Come Dictated by:   Sondra Come, D.O. Proc. Date: 11/30/01 Admit Date:  11/24/2001   CC:         Hal T. Stoneking, M.D.  Dr. Theda Sers  Cristi Loron, M.D.   Consultation Report  NEW PATIENT CONSULTATION  REFERRING PHYSICIAN:  Hal T. Stoneking, M.D.  Dear Dr. Pete Glatter:  Thank you very much for kindly referring [name] to [facility].  The patient was evaluated in our clinic today.  Please refer to the following for details regarding the history, physical examination, and treatment plan.  Once again, thank you for allowing [place] to participate in the care of [name].  CHIEF COMPLAINT:  Low back pain and rib pain.  HISTORY OF PRESENT ILLNESS:  [name] is a pleasant 70 year old right-hand dominant female with a six-month history of low back pain.  The patient states that she was hospitalized for swelling in her stomach and she complained of pain in her low back and had an MRI done revealing "bulging disks."  On discharge she was started on OxyContin and oxycodone for breakthrough pain.  She apparently followed up with her primary care physician and has undergone physical therapy but was unable to tolerate most of the exercises.  On specific questioning she denies having been taught any pelvic tilt exercises, quadruped, or other simple lumbar stabilization exercises. She has not had any aquatic therapy.  Currently she denies any radicular symptoms into the lower extremities but states that following one of her three lumbar epidural steroid injections she began to develop some radicular symptoms into the left lower extremity, but these have resolved.  Her epidural steroid injections have been performed within the  last few months.  She got no relief at all with the epidural injections.  She denies any weakness in her lower extremities at this time but states that she did have at one point some left lower extremity weakness with her knee giving out.  In fact, she apparently fell after her knee gave out and hit her ribs and also complains of some left rib pain.  She had some x-rays apparently, which did not reveal any fractures per her report.  Further workup has also included a bone scan, of which I do not have the results.  Currently her pain is a 7/10 on a subjective scale.  Function and quality of life indices have essentially remained stable, and she continues working in her current job.  Sleep is described as okay. Her pain is described as constant, throbbing, with intermittent weakness in her left lower extremity surrounding her knee.  Her symptoms are worse with walking, bending, sitting, and improved with medications to some degree.  In terms of medications, she only takes the OxyContin typically once per day in the evening secondary to it "messing with my mind."   She has about 10 OxyContin left but wants to get off of this medication because of the way it makes her feel.  She does continue to take Percocet 5 mg typically up to four times per day and has two pills left.  She denies any adverse reaction with the Percocet.  She has tried some Advil and ibuprofen in the past for other problems, and it gave  her an upset stomach.  She has not recently been on any anti-inflammatories.  I reviewed the health and history form and 14-point review of systems.  The patient denies fever, chills, night sweats, weight loss, bowel or bladder dysfunction with the exception of occasional irritable bowel syndrome.  PAST MEDICAL HISTORY:  Asthma, history of uterine cancer status post hysterectomy at age 70, hiatal hernia, sinus problems, cataracts.  PAST SURGICAL HISTORY:  Hysterectomy, left shoulder surgery,  cholecystectomy, sinus surgery.  FAMILY HISTORY:  Lung disease, cancer, diabetes.  SOCIAL HISTORY:  The patient smokes one-half pack of cigarettes per day, stating that she had quit until her back started to hurt.  I counseled her on the importance of smoking cessation in terms of low back pain and overall health.  She denies alcohol use.  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  She likes her job but states there is a significant amount of stress involved.  ALLERGIES:  CODEINE.  MEDICATIONS:  Advair, Allegra, Prevacid, theophylline, hydroxyzine, oxycodone, and OxyContin.  PHYSICAL EXAMINATION:  VITAL SIGNS:  Blood pressure is 150/74, pulse 94, respirations 16, O2 saturation is 93% on room air.  GENERAL:  A mildly obese female in no acute distress.  MUSCULOSKELETAL/NEUROLOGIC:  Examination of the patients back reveals a level pelvis with increased lumbar lordosis.  No scoliosis is noted.  Range of motion is limited and guarded secondary to pain, especially with extension. Manual muscle testing is 5/5 bilateral lower extremities.  Sensory examination is intact to light touch bilateral lower extremities.  Muscle stretch reflexes are 3+/4 bilateral patellar, medial hamstrings, and Achilles.  Toes are downgoing bilaterally.  There is no ankle clonus noted bilaterally.  There is no hypertonicity noted in the lower extremities.  Distal pulses are present bilaterally.  There is no heat, erythema, or edema in the lower extremities. Straight leg raise is negative bilaterally, but patient has tight hamstrings. The straight leg raise maneuver does reproduce patients low back pain on the left.  Pearlean Brownie test is negative bilaterally, but patient does have tight hip flexor muscles.  Palpatory examination reveals significant tenderness to  palpation bilateral lumbar paraspinals and left anterolateral ribs.  DIAGNOSTIC  STUDIES:  MRI of the lumbar spine dated June 23, 2001, reveals a transitional-appearing S1 vertebra.  At L5-S1 there is a moderate-sized broad-based disk protrusion with protruding disk material, more notable right lateral position, causing right L5-S1 neural foraminal narrowing and mass effect upon the exiting right L5 nerve root as well as impression upon the right ventral aspect of the thecal sac and right-sided subarticular lateral recess stenosis.  At L3-4 there is multifactorial spinal stenosis with L4-5 multifactorial minimal spinal stenosis.  There are also bilateral facet joint degenerative changes diffusely.  IMPRESSION: 1. Chronic low back pain, likely multifactorial in etiology, with facet    arthropathy, degenerative disk disease, as well as mild spinal stenosis    without neurogenic claudication. 2. Left rib pain.  PLAN: 1. A long and thorough discussion with Ms. Stum regarding her pain complaints    and treatment options.  Etiology of her pain is uncertain at this point.  I    would recommend a trial of lumbar facet blocks to ascertain whether or not a    component of patients pain is coming from her facet joints diagnostically.    We discussed this at length.  She wishes to proceed in this fashion. 2. In terms of medications,  she states that she wants to get off of the    OxyContin, and I think this is reasonable.  I would like to see [place] be able    to wean her from her narcotic-based pain medication.  In this regard, we    will discontinue OxyContin but continue with the Percocet 5/325 mg one p.o.    q.i.d. as needed, #100 without refills. 3. I will add Bextra 20 mg one p.o. q.d., #30 without refills. 4. Will start Lidoderm 5% to apply to her left ribs up to 12 hours per day,    #30 without refills. 5. In terms of rehabilitation, I think she needs to get into aquatic therapy,    as she was unable to tolerate a land-based program.  I would like the    therapist to  concentrate on range of motion, stretching, strengthening,    core stabilization, and advance to a land-based program with a home    exercise program.  Therapy for two to three times per week for four weeks. 6. Will consider adding Neurontin. 7. Patient to return to clinic for lumbar facet block trial diagnostically.  The patient was educated in the above findings and recommendations and understands.  No barriers to communication. Dictated by:   Sondra Come, D.O. Attending Physician:  Sondra Come DD:  11/30/01 TD:  12/01/01 Job: [id number]

## 2010-12-26 NOTE — Consult Note (Signed)
[facility]  Patient:    [name] [name], [name] [name] Visit Number: [id number] MRN: [id number]          Service Type: Attending:  Sondra Come, D.O. Dictated by:   Sondra Come, D.O. Proc. Date: 01/30/02   CC:         Hal T. Stoneking, M.D.   Consultation Report  REASON FOR FOLLOWUP:  The patient returns to clinic today as scheduled for reevaluation.  She was last seen on January 16, 2002.  In the interim, she has been taking Roxicodone 15 mg one-half to one tab for pain primarily in her left shoulder following a left humeral head fracture.  She has primarily been taking two and a half tabs per day.  She states that a whole tab makes her somewhat cloudy and she feels like it does help her pain to some degree but she is not able to function at work on a whole pill, so she is taking a half a pill during the day, which she states does not significantly help her pain but does not make her cloudy.  She also states that her low back pain is aggravated by sleeping sitting up secondary to her left humerus fracture.  She states that she has about eight more weeks in the sling.  Her pain today is an 8/10 on a subjective scale.  I review health and history form and 14-point review of systems.  No new neurologic complaints.  Also in the interim, she has been to see Dr. Cristi Loron, neurosurgeon, for some abnormalities on the C spine MRI.  Apparently, he did not have the MRI at her visit and she is unsure of what intervention he could offer at this point.  PHYSICAL EXAMINATION:  GENERAL:  Physical examination reveals a healthy female in no acute distress. She has left upper extremity in a sling.  VITAL SIGNS:  Blood pressure 147/85, pulse 91, respirations 16, O2 saturation 93% on room air.  NEUROMUSCULAR:  No neurologic findings in the lower extremities including motor, sensory and reflexes.  Palpatory examination reveals tenderness to palpation in bilateral lumbar  paraspinals with mild tightness in her paraspinal muscles.  IMPRESSION: 1. Multifactorial low back pain with mild exacerbation. 2. Degenerative disk disease of the lumbar spine. 3. Lumbar facet arthropathy. 4. 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 5. Left humeral head fracture.  PLAN: 1. The patient and I discussed further pain management strategies.  At this    point, I think it is reasonable to switch her to a long-acting    narcotic-based pain medication, as she will be in her splint for another    two months, allowing her humeral head fracture to heal conservatively.  I    will switch her to Duragesic 25 mcg one q.72h., #5 without refills.    Medication was discussed in detail; nursing gave directions. 2. Instructed the patient to continue Roxicodone 15 mg one-half of a tab three    times daily as needed for breakthrough pain and we discussed this. 3. Will prescribe Flexeril 10 mg one p.o. q.h.s. for sleep and lumbar spasm. 4. Lidoderm 5% patches to low back up to 12 hours per day as needed. 5. The patient is to return to clinic in two weeks for reevaluation.  The patient was educated in the above findings and recommendations and understands.  No barriers to communication. Dictated by:   Sondra Come, D.O. Attending:  Sondra Come, D.O. DD:  01/30/02 TD:  01/31/02 Job: [id number]

## 2010-12-26 NOTE — Consult Note (Signed)
NAME:  [name] [name], [name] [name]                         ACCOUNT NO.:  [id number]   MEDICAL RECORD NO.:  [id number]                   PATIENT TYPE:  REC   LOCATION:  [facility]                                  FACILITY:  [facility]   PHYSICIAN:  Zachary George, DO                      DATE OF BIRTH:  [birth year]   DATE OF CONSULTATION:  08/25/2002  DATE OF DISCHARGE:                                   CONSULTATION   HISTORY OF PRESENT ILLNESS:  The patient returns to the clinic today for  radiofrequency neural ablation of the left lumbar medial branches and L5  dorsal ramus.  She has undergone diagnostic medial branch blocks on May 11, 2002, with significant improvement in her symptoms.  She continues to  complain of lower back pain at this time.  I reviewed the health and history  form and 14-point review of systems.  She denies any new neurologic  complaints.  She was last seen in clinic on August 01, 2002.  She has been  doing a home exercise program as designed by physical therapy.  Her pain  today is an 8/10 on a subjective scale.  She continues on hydrocodone as  needed for severe pain.  She also continues on Bextra 20 mg daily.   PHYSICAL EXAMINATION:  The blood pressure is 147/75, pulse 77, respirations  20, and O2 saturation 95% on room air.   IMPRESSION:  1. Lumbar facet syndrome secondary to lumbar facet arthropathy, left.  2. Degenerative disk disease of the lumbar spine.   PLAN:  1. Radiofrequency neurotomy of the left lumbar medial branches and L5 dorsal     ramus.  2. Continue Norco 10 mg/325 mg one p.o. q.i.d. as needed, #50 without     refills.  3. Continue Bextra 20 mg daily.  4. The patient is to return to the clinic in two weeks for reevaluation.   PROCEDURES:  Radial frequency neural ablation of the left L2, L3, and L4  medial branches and L5 dorsal ramus.  The procedure was described to the  patient in detail, including the risks, benefits, limitations, and  alternatives.   The risks include, but are not limited to bleeding,  infection, nerve injury, failure to relieve pain, increased pain, and  allergic reaction to medication.  The patient understands and wishes to  proceed.  Informed consent was obtained.  The patient was brought back to  the fluoroscopy suite and placed on the table in the prone position.  The  skin was prepped and draped in the usual sterile fashion.  The skin and  subcutaneous tissues were anesthetized with 3 cubic centimeters of 1%  lidocaine at four independent needle access points.  Under direct  fluoroscopic guidance, a 22 gauge radiofrequency needle with a curved 10 mm  active tip was guided to the junction of the left S1 superior articular  process with its corresponding transverse process/sacral ala.  The patient  does have a transitional segment.  This was done under multiple fluoroscopic  views.  The radiofrequency needle was advanced as well to the junction of  the L3, L4, and L5 superior articular processes with their corresponding  transverse processes under multiple fluoroscopic views.  Bony contact was  made and negative aspiration performed.  Needle tip placement was further  confirmed with the injection of 0.2 cubic centimeters of Omnipaque 180 at  each site with appropriate needle placement.  Each site was then stimulated  appropriately for sensory and motor response and 0.5 cubic centimeters of 1%  lidocaine were then injected followed by radiofrequency lesioning at 80  degrees for 90 seconds at each site.  The patient tolerated the procedure  well.  There were no complications.  The patient was monitored and released  in stable condition. Discharge instructions were given.  The patient was  instructed to use ice liberally for rebound pain, as well as the hydrocodone  as prescribed.   The patient was educated on the above findings and recommendations and  understands.  There were no barriers to communication.                                                Zachary George, DO    JW/MEDQ  D:  08/25/2002  T:  08/26/2002  Job:  [id number]

## 2010-12-26 NOTE — Discharge Summary (Signed)
[place]  Patient:    [name] [name], [name] [name] Visit Number: [id number] MRN: [id number]          Service Type: Attending:  Verlin Grills, M.D. Dictated by:   Verlin Grills, M.D. Adm. Date:  06/22/01 Disc. Date: 06/25/01                             Discharge Summary  DISCHARGE DIAGNOSIS:  Generalized abdominal pain, etiology undetermined.  HOSPITAL COURSE:  [name] is a 70 year old female with a history of chronic sinusitis requiring two sinus surgeries in the past.  On May 31, 2001, she received Levaquin for 10 days to treat sinusitis and bronchitis. She then developed generalized abdominal pain and low back pain.  She received an empiric course of Flagyl for possible C. difficile colitis.  When she did not improve, she was referred to [facility] for an abdominal CT scan which suggested appendicitis.  She was evaluated by a surgeon who ordered a repeat CT scan of the abdomen and pelvis which was normal.  She continues to have generalized abdominal pain and low back pain, unassociated with nausea, vomiting, or gastrointestinal bleeding.  The patient was told she had ulcerative colitis approximately 20 years ago.  [name] was admitted to the hospital on June 22, 2001.  A 2-D cardiac echocardiogram was performed revealing normal overall left ventricular systolic function with an estimated left ventricular ejection fraction of 55 to 65%.  Aortic valve was mildly thickened, and there was mild mitral valve regurgitation.  Her admission electrocardiogram revealed an unusual P axis with possible ectopic atrial rhythm.  She underwent an esophagogastroduodenoscopy and proctocolonoscopy to the cecum which was normal.  An MRI of the lumbosacral spine revealed a bulging disk at L5-S1.  [name] was discharged from the hospital on June 25, 2001, to be followed up by her primary care physician.  She was clinically  improved at discharge, and medically stable. Dictated by:   Verlin Grills, M.D. Attending:  Verlin Grills, M.D. DD:  07/19/01 TD:  07/19/01 Job: [id number]

## 2010-12-26 NOTE — Consult Note (Signed)
NAME:  [name] [name], [name] [name]               ACCOUNT NO.:  [id number]   MEDICAL RECORD NO.:  [id number]          PATIENT TYPE:  INP   LOCATION:  1503                         FACILITY:  [facility]   PHYSICIAN:  Petra Kuba, M.D.    DATE OF BIRTH:  [birth year]   DATE OF CONSULTATION:  DATE OF DISCHARGE:                                 CONSULTATION   REASON FOR CONSULTATION:  We were asked to see [name] [name] this afternoon  on October 15, 2006 in consult for ulcerative colitis.   HISTORY OF PRESENT ILLNESS:  This 70 year old female is a patient with a  long history of very quiet ulcerative colitis.  Current symptoms are  primarily nausea and abdominal pain with bloating.  She has had nausea  for 8-10 days without vomiting.  She describes her abdominal pain as  being throughout her abdomen, worse in the epigastric area.  She has no  dysphasia.  No anorexia.  No heartburn.  Her bowel movements have been  unchanged.  She has 1-2 formed to slightly liquid bowel movements per  day.  She has seen no melena, hematochezia or frank blood in her bowel  movements.  She reports that she is currently hungry after being on  clear liquids for approximately 24 hours.  She has experienced no weight  loss in the past several months.  The patient reports that she has these  episodes of severe nausea about twice a year.  That has also been  documented in her medical history.  She also reports that in February,  starting approximately September 16, 2006, she had influenza and she is  now recovering from it.  She states that she has a gastroenterologist in  [place] but cannot remember the name of the doctor today.  She will try  to think of it over the weekend for me.   PAST MEDICAL HISTORY:  1. Possible ulcerative colitis.  2. Hypertension.  3. Myocardial infarction.  4. Hyperlipidemia.  5. Chronic obstructive pulmonary disease.  6. Gastroesophageal reflux disease.  7. Non-ulcer dyspepsia.  8. Hysterectomy.  9.  Cholecystectomy.  10.She has had normal upper endoscopy in the years 2002, 2004 and      2006.  11.She had diverticulosis on colonoscopy in 2006.   CURRENT MEDICATIONS:  Current medications are Carvedilol Prevacid,  Lipitor, Phenergan and Plavix.   ALLERGIES:  COMPAZINE, REGLAN, SULFA and possibly AVELOX.   REVIEW OF SYSTEMS:  She describes the pain in her vagina.  She thought  she had a vaginal infection and went to see a gynecologist several days  ago.  Gynecologist told her there was no infection.   SOCIAL HISTORY:  She is positive for cigarettes but negative for  alcohol.  She lives alone.   FAMILY HISTORY:  Negative for colon cancer.  Negative for bowel disease.  Negative for liver disease.   PHYSICAL EXAMINATION:  She is alert and oriented and in no apparent  distress.  Her temperature is 98.9, pulse 67, respirations are 16 per  minute and her blood pressure is 93/61.  Respiratory System demonstrates  expiratory wheezing.  Her abdomen is soft, nondistended, diffusely  tender with positive bowel sounds.   LABORATORY DATA:  Labs show a white count of 12.3, hemoglobin 11.7,  platelet count 317.  Chem-7 shows sodium 136, potassium 3.3, chloride  105, bicarb 26, BUN 10, creatinine 0.62, glucose 145.  LFTs are normal.  Urinalysis showed a small amount of blood, many bacteria.  On CT scan  October 14, 2006, she had mild chronic inflammatory changes in her colon.  Her x-ray showed no obstructive bowel gas pattern.   ASSESSMENT:  Dr. Petra Kuba has seen and examined this patient.  He  states that she is a 70 year old female with multiple medical problems  including:  1. Possible history of ulcerative colitis.  2. Non-ulcer dyspepsia.  3. Chronic obstructive pulmonary disease.   He does not believe that her current problems are secondary to her  ulcerative colitis.  The patient has never been on any Pentasa, Lialda  or any other mesalamine-containing medications.    RECOMMENDATIONS:  To double her Protonix.  Secure appropriate  antibiotics if necessary for possible UTI.  Perform stool studies.  Will  try Bentyl to see if that offers the patient some relief from her pain  and nausea.  Thanks very much for this consultation.      Stephani Police, PA    ______________________________  Petra Kuba, M.D.    MLY/MEDQ  D:  10/15/2006  T:  10/15/2006  Job:  [id number]

## 2010-12-26 NOTE — Consult Note (Signed)
[facility]  Patient:    [name] [name], [name] [name] Visit Number: [id number] MRN: [id number]          Service Type: PMG Location: TPC Attending Physician:  Sondra Come Dictated by:   Sondra Come, D.O. Admit Date:  11/24/2001                            Consultation Report  NO DICTATION Dictated by:   Sondra Come, D.O. Attending Physician:  Sondra Come DD:  01/16/02 TD:  01/17/02 Job: [id number]

## 2010-12-26 NOTE — Procedures (Signed)
[facility]  Patient:    [name] [name], [name] [name] Visit Number: [id number] MRN: [id number]          Service Type: Attending:  Verlin Grills, M.D. Dictated by:   Verlin Grills, M.D. Proc. Date: 06/24/01                             Procedure Report  PROCEDURE:  Esophagogastroduodenoscopy and colonoscopy.  PROCEDURE INDICATION:  [name] [name] (date of birth - 1940-12-12) is a 70 year old female with generalized abdominal pain and low back pain.  Her CBC with differential reveals a mild normocytic anemia of 11.7 g with mildly elevated white blood cell count of 14,200 and slightly elevated platelet count of 409,000.  Her sed rate is 33 mm/hour.  Her complete metabolic profile was normal.  Her blood cultures were negative.  Her chest x-ray was normal except mild hyperinflation.  Her lumbosacral spine films and MRI of the lumbosacral spine reveals a bulging disk at L5-S1.  She had a 2-D cardiac echocardiogram to evaluate a systolic murmur which is pending.  [name] [name] tells me she was diagnosed with ulcerative colitis about 20 years ago but by history has no symptoms to suggest chronic inflammatory bowel disease.  ENDOSCOPIST:  Verlin Grills, M.D.  PREMEDICATIONS:  Versed 10 mg, Demerol 100 mg.  ENDOSCOPE:  Olympus pediatric colonoscope.  DESCRIPTION OF PROCEDURE:  After obtaining informed consent, [name] [name] was placed in the left lateral decubitus position.  I administered IV Versed and IV Demerol to achieve conscious sedation for the procedure.  The patients blood pressure, oxygen saturation and cardiac rhythm were monitored throughout the procedure and documented in the medical record.  The Olympus pediatric colonoscope was passed through the posterior hypopharynx into the proximal esophagus without difficulty.  The hypopharynx, larynx and vocal cords appeared normal.  Esophagoscopy:  The proximal, mid and lower segments of  the esophagus appear normal.  Gastroscopy:  Retroflexed view of the gastric cardia and fundus was normal. The gastric body, antrum and pylorus appeared normal.  Duodenoscopy:  The duodenal bulb, mid duodenum and distal duodenum, and proximaleft jejunum appear normal.  ASSESSMENT:  Normal esophagogastroduodenoscopy.  PROCTOCOLONOSCOPY TO THE CECUM:  Anal inspection was normal. Digital rectal exam was normal.  The Olympus pediatric video colonoscope was introduced into the rectum and easily advanced to the cecum.  Colonic preparation for the examination today was satisfactory.  Rectum:  Normal.  Sigmoid colon and descending colon:  Normal.  Splenic flexure:  Normal.  Transverse colon:  Normal.  Hepatic flexure:  Normal.  Ascending colon:  Normal.  Cecum and ileocecal valve:  Normal.  ASSESSMENT:  Normal proctocolonoscopy to the cecum.  No endoscopic evidence for the presence of colorectal neoplasia or inflammatory bowel disease. Dictated by:   Verlin Grills, M.D. Attending:  Verlin Grills, M.D. DD:  06/24/01 TD:  06/24/01 Job: [id number]

## 2010-12-26 NOTE — Consult Note (Signed)
NAME:  [name] [name]                         ACCOUNT NO.:  [id number]   MEDICAL RECORD NO.:  [id number]                   PATIENT TYPE:  REC   LOCATION:  TPC                                  FACILITY:  [facility]   PHYSICIAN:  Sondra Come, D.O.                 DATE OF BIRTH:  06/19/1941   DATE OF CONSULTATION:  07/05/2002  DATE OF DISCHARGE:                                   CONSULTATION   REASON FOR CONSULTATION:  The patient returns to clinic today for re-  evaluation.  She was last seen on 06/07/02.  She continues to complain of  severe low back pain, however, she states she has not been to physical  therapy as I had prescribed at last visit secondary to bronchitis.  She has  been on a few courses of antibiotics, and is finally feeling to the point  where she can start physical therapy, and we discussed this.  She has  continued to use hydrocodone 10 mg/325 mg up to t.i.d. for second line  therapy after Bextra 20 mg q.d. which she states was helpful.  She has run  out of hydrocodone.  She states that on a few occasions one of her friends  who had broken his arm had provided her with Percocet for her pain, and I  counseled the patient on obtaining controlled substances from friends, and  how that is a violation of our controlled substance agreement.  I instructed  her not to obtain any further controlled substances from friends.  She  understands.  I reviewed health and history form and 14 point review of  systems.  Function and quality of life indices remain declined.  Sleep is  fair.  Again, she notes that she had significant improvement following the  lumbar facet medial branch blocks for approximately three weeks.  We have  been in discussion of going ahead and performing radiofrequency neurotomy of  the medial branches, however, I would like to get her into the physical  therapy program first, and she understands this.   PHYSICAL EXAMINATION:  GENERAL:  A healthy-appearing  female in no acute  distress.  VITAL SIGNS:  Blood pressure 154/58, pulse 87, respirations 18, O2  saturation 98% on room air.  BACK:  Increased lumbar lordosis with tenderness to palpation bilateral  lumbar paraspinous region with some mild spasm in the left lumbar  paraspinous region as well.  Range of motion of the lumbar spine is guarded  in all planes.  NEUROLOGIC:  Without change in the lower extremities, including motor,  sensory, and reflexes.  There is no ankle clonus noted.   IMPRESSION:  1. 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  2. Degenerative disk disease of the lumbar spine.   PLAN:  1. Will being physical therapy.  2. Continue Bextra 20 mg q.d.  3. I will change hydrocodone to Ultracet one or  two p.o. q.i.d. p.r.n. pain     #100 with one refill.  I also provide the patient with 20 sample pills.  4. We will refer the patient to [facility] for a neuromuscular stimulator to     assist with the lumbar spasm and pain control.  5. Consider radiofrequency neurotomy of the left lumbar medial branches     following a course of physical therapy.  6. The patient is to return to clinic in one month for re-evaluation.   The patient was educated on the above findings and recommendations and  understands.  There were no barriers to communication.                                                Sondra Come, D.O.    JJW/MEDQ  D:  07/05/2002  T:  07/05/2002  Job:  [id number]

## 2010-12-26 NOTE — Consult Note (Signed)
NAME:  [name] [name], [name] [name]                         ACCOUNT NO.:  [id number]   MEDICAL RECORD NO.:  [id number]                   PATIENT TYPE:  REC   LOCATION:  TPC                                  [facility]ACILITY:  [facility]   PHYSICIAN:  Sondra Come, D.O.                 DATE OF BIRTH:  Sep 17, 1940   DATE OF CONSULTATION:  03/06/2002  DATE OF DISCHARGE:                  PHYSICAL MEDICINE & REHABILITATION CONSULTATION   HISTORY OF PRESENT ILLNESS:  The patient returns to clinic today for  reevaluation.  She was last seen on February 13, 2002.  In the interim, she had  discontinued using her left upper extremity sling.  She continues with a  home exercise program but continues to have significant left shoulder pain  secondary to humeral head fracture.  She follows up with her orthopedic  surgeon in two weeks.  She has been using hydrocodone 10 mg/325 mg up to  three times a day as needed for her pain.  She does wish ultimately to get  off of the hydrocodone and she requests to change to Darvocet.  We had a  long discussion regarding medications.  Her pain today is a 5/10 on a  subjective scale.  Her low-back pain continues to be improved.  She does  have some mild neck pain and follows up with Dr. Lovell Sheehan in the next few  weeks to evaluate her MRI of her C-spine, which according to her shows some  degenerative disk changes.  She denies any upper extremity radicular  complaint.  I review health and history form and 14-point review of systems.   PHYSICAL EXAMINATION:  GENERAL:  A healthy female in no acute distress.  VITAL SIGNS:  Blood pressure 145/69, pulse 100, respirations 16, O2  saturation is 95% on room air.  NEUROLOGIC:  Range of motion of the left shoulder is decreased to flexion  and abduction of less than 90 degrees with discomfort.  Neurovascularly  intact in the upper extremities including motor, sensory, and reflexes.   IMPRESSION:  1. Left humeral head fracture, healing.  2.  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  3. Cervicalgia with reported degenerative disk changes.  No cervical     radiculopathy noted at this time.   PLAN:  1. I had a long discussion with the patient regarding further medication     options.  At this point, I think it is reasonable to continue with     hydrocodone 10 mg, as the patient continues to experience discomfort with     her therapy program.  I do not think Darvocet will be strong enough for     her at this point.  Ultimately, we will start to wean the narcotic-based     pain medication and she is in agreement.  I think we can do so in the     next month or so.  2. Continue Celebrex 200 mg b.i.d.  3. Continue with Flexeril 10 mg at bedtime and Lidoderm patches up to 12     hours per day.  4. The patient to return to clinic in one month for reevaluation.  I have     given her a prescription for Norco 10 mg/325 mg one p.o. b.i.d. to t.i.d.     as needed; #60 without refills.   The patient was educated on the above findings and recommendations and  understands.  No barriers to  communication.                                               Sondra Come, D.O.   JJW/MEDQ  D:  03/06/2002  T:  03/10/2002  Job:  [id number]

## 2010-12-26 NOTE — Consult Note (Signed)
[facility]  Patient:    [name] [name], [name] [name] Visit Number: [id number] MRN: [id number]          Service Type: PMG Location: TPC Attending Physician:  Sondra Come Dictated by:   Sondra Come, D.O. Proc. Date: 01/16/02 Admit Date:  11/24/2001   CC:         Hal T. Stoneking, M.D.   Consultation Report  Ms. Belflower returns to clinic today as scheduled for reevaluation.  She was last seen on Dec 26, 2001.  At that time I injected her right shoulder for rotator cuff syndrome.  She states that she has nearly complete relief of her right shoulder pain.  In the interim, however, she states she fell approximately one week ago and fractured her left humeral head and has been followed by Dr. Marina Goodell, orthopedic surgeon in [place] who has treated her conservatively with a sling.  He also gave her a prescription for Percocet 5/325 mg and instructed her to take one to two q.4h. for pain.  Patient states that 10 mg of Percocet does not seem to be helping significantly.  I asked her about the prescription for hydrocodone that I had given her on Dec 26, 2001.  She states she was using that one to two times per day and has run out.  In terms of other medications, she does not bring any with her with the exception of the Percocet.  She is no longer taking Ultram as it did not seem to be helping her previously.  She continues to take Celebrex and I believe she is also taking Bextra.  She also states that the orthopedic nurse told her to take over-the-counter ibuprofen three to four at a time in between the Percocet.  I discussed taking three anti-inflammatory medications with her and she did not realize that they were similar type medications.  I asked her to bring her prescription medicine with her at next visit so we can discuss this in more detail.  Also in the interim, she has undergone an MRI of her cervical spine which was ordered by Dr. Corliss Skains.  She states that  there was something on the MRI that needs to be evaluated by a neurosurgeon and she has an appointment upcoming.  In terms of patients low back pain, this was improving, but seems to have had some mild exacerbation following this left humeral head fracture.  I review the health and history form and 14 point review of systems.  Patient denies radicular symptoms in the upper and lower extremities.  She does have significant pain in her left shoulder and her left arm.  PHYSICAL EXAMINATION  GENERAL:  Healthy female in no acute distress.  VITAL SIGNS:  Blood pressure 153/47, pulse 79, respirations 18, O2 saturation 99% on room air.  EXTREMITIES:  Examination of the left upper extremity reveals decreased range of motion with significant guarding.  There is ecchymosis over the left shoulder and arm.  Palpatory examination reveals significant tenderness to palpation to very light touch.  Neurologically intact in the upper and lower extremities.  Range of motion of the right upper extremity is full without any discomfort.  Negative impingement signs today.  IMPRESSION: 1. Acute left humeral head fracture. 2. Right rotator cuff syndrome/impingement syndrome significantly improved. 3. Low back pain, multifactorial, with mild exacerbation. 4. Degenerative disk disease of the lumbar spine. 5. Lumbar facet arthropathy. 6. 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  PLAN: 1. I had  a long discussion with Ms. Kinnick regarding medication usage.  At this    point will have her discontinue Bextra and ibuprofen if she is in fact    taking these.  Will have her continue on Celebrex 200 mg daily.  I have    asked her to also discuss this with her orthopedist. 2. Discontinue Percocet and begin roxicodone 15 mg one-half to one p.o. q.4h.    as needed #60 without refills. 3. Patient to return to clinic in two weeks for reevaluation.  Will await    aquatic therapy once  patients fracture heals.  Patient was educated on the above findings and recommendations and understands.  No barriers to communication. Dictated by:   Sondra Come, D.O. Attending Physician:  Sondra Come DD:  01/16/02 TD:  01/17/02 Job: [id number]

## 2010-12-26 NOTE — Consult Note (Signed)
[facility]  Patient:    TAMU, GOLZ Visit Number: [id number] MRN: [id number]          Service Type: PMG Location: [facility] Attending Physician:  Sondra Come Dictated by:   Sondra Come, D.O. Proc. Date: 12/26/01 Admit Date:  11/24/2001   CC:         Hal T. Stoneking, M.D.   Consultation Report  [name] returns to clinic today as scheduled for reevaluation.  She was last seen on Dec 12, 2001.  She continues to have pain in her right shoulder significantly.  Her low back pain fluctuates throughout the day, but is improved overall.  She has taken Ultram 50-100 mg without significant relief of her low back pain.  She was taking Percocet previously with some improvement.  Most of her pain today involves her right shoulder.  She has not gotten into a physical therapy program yet.  Overall, her function and quality of life indexes have improved.  She thinks that the Celebrex is helping her to some degree.  Her pain today is a 7/10 on a subjective scale.  I review health and history form and 14 point review of systems.  Patient denies any upper extremity radicular symptoms.  In the meantime she has been referred to Dr. Corliss Skains and was evaluated based on bone scan findings of arthritis. Apparently Dr. Corliss Skains has ordered an MRI of patients cervical spine of which I do not have the results.  PHYSICAL EXAMINATION  GENERAL:  Healthy female in no acute distress.  VITAL SIGNS:  Blood pressure 144/63, pulse 91, respirations 16, O2 saturation 98% on room air.  NEUROLOGIC:  Intact to motor, sensory, and reflexes in the upper and lower extremities bilaterally.  EXTREMITIES:  Examination of the right shoulder reveals full range of motion with pain on end range.  There is a positive Hawkins, Neer, empty can, OBrien with negative drop arm tests.  BACK:  Palpatory examination reveals significant tenderness to palpation in the upper back muscles on the  right, especially with trigger points in the upper trapezius, levator scapulae, and cervical paraspinals.  This reproduces patients symptoms.  Spirling test is negative bilaterally.  IMPRESSION: 1. Right rotator cuff syndrome/impingement syndrome. 2. Low back pain, improved. 3. Degenerative disk disease of the lumbar spine. 4. Lumbar facet arthropathy. 5. 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  PLAN: 1. I had a long discussion with Ms. Hyatt regarding treatment options.  In    terms of patients shoulder pain, it is reasonable to proceed with a right    subacromial steroid injection for impingement syndrome/rotator cuff    syndrome. 2. In terms of patients low back pain and shoulder pain, I have instructed    her to get into physical therapy.  I have written a prescription for this    at a previous visit. 3. Continue Celebrex 200 mg daily. 4. Continue Ultram one to two up to q.i.d. p.r.n. 5. Will prescribe Norco 5/325 mg one p.o. t.i.d. p.r.n. #30 without refills.    This will help facilitate a therapy program. 6. Patient to return to clinic in two weeks for reevaluation.  PROCEDURE:  Right subacromial steroid injection.  Procedure was described to patient in detail including risks, benefits, limitations, and alternatives. Patient wished to proceed.  Informed consent was obtained.  Skin was prepped in the usual sterile fashion with Betadine and alcohol.  The right subacromial space was injected with 1 cc of Kenalog 40 mg/cc  plus 1 cc of 1% lidocaine plus 1 cc of 0.25% bupivacaine without complications.  Patient tolerated the procedure well.  Discharge instructions given.  Patient was educated on the above findings and recommendations and understands.  No barriers to communication. Dictated by:   Sondra Come, D.O. Attending Physician:  Sondra Come DD:  12/26/01 TD:  12/28/01 Job: [id number]

## 2010-12-26 NOTE — H&P (Signed)
[place]  Patient:    [name] [name], [name] [name] Visit Number: [id number] MRN: [id number]          Service Type: Attending:  Verlin Grills, M.D. Dictated by:   Verlin Grills, M.D.                           History and Physical  DATE OF BIRTH:  11/11/1940  ADMISSION DIAGNOSES: 1. Generalized abdominal pain. 2. Low back pain. 3. Low-grade fever.  HISTORY OF PRESENT ILLNESS:  [name] is a 70 year old female with a history of chronic sinusitis requiring two sinus surgeries in the past.  On May 31, 2001, she received Levaquin for 10 days to treat sinusitis and bronchitis.  Post antibiotic therapy she developed generalized abdominal pain and low back pain.  She apparently received a course of Flagyl for possible Clostridium difficile.  When she did not improve, she was referred to [facility] for a CT scan of her abdomen and pelvis, which apparently suggested appendicitis.  She was evaluated by a surgeon, who ordered a repeat CT scan of the abdomen and pelvis, which was normal.  [name] continues to have generalized abdominal pain and rather significant low back pain.  She reports no gastrointestinal bleeding, nausea, or vomiting. 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  She did not require surgery and apparently has had no recurrence in her colitis.  MEDICATION ALLERGIES:  SULFA.  CHRONIC MEDICATIONS:  Hydrochlorothiazide, Humibid, Flovent, theophylline, Prevacid, and Prozac.  PAST MEDICAL HISTORY:  Chronic sinusitis and asthmatic bronchitis.  Two remote sinus surgeries.  Depression.  Hypertension.  Menopausal.  Left shoulder surgery.  Remote tonsillectomy and cholecystectomy.  Total abdominal hysterectomy for uterine cancer at age 69.  "Ulcerative colitis" 20 years ago. Cataract surgery.  FAMILY HISTORY:  Negative for colon cancer.  HABITS:  [name] does not smoke cigarettes  and does not consume alcohol.  PHYSICAL EXAMINATION:  WEIGHT:  194-1/2 pounds.  VITAL SIGNS:  Temperature 100 degrees, blood pressure 154/86.  GENERAL APPEARANCE:  [name] is alert.  She is complaining of both generalized abdominal pain and low back pain.  HEENT:  The sclerae are nonicteric.  Conjunctivae normal.  NECK:  No adenopathy.  LUNGS:  Clear to auscultation.  CARDIAC:  Regular rhythm with early systolic murmur heard best along the left sternal border.  No diastolic murmur.  ABDOMEN:  Soft and flat.  The patient tells me that she experiences pain with palpation in all quadrants.  EXTREMITIES:  No edema.  ASSESSMENT: 1. Generalized abdominal pain. 2. Low back pain. 3. Systolic murmur. 4. Low-grade temperature.  PLAN: 1. Rule out infective endocarditis:  2-D echocardiogram and blood cultures. 2. MRI of the lumbar spine. 3. Colonoscopy and esophagogastroduodenoscopy. Dictated by:   Verlin Grills, M.D. Attending:  Verlin Grills, M.D. DD:  06/22/01 TD:  06/22/01 Job: [id number]

## 2010-12-26 NOTE — Consult Note (Signed)
NAME:  [name] [name], [name] [name]                           ACCOUNT NO.:  [id number]   MEDICAL RECORD NO.:  [id number]                    PATIENT TYPE:  REC   LOCATION:                                       FACILITY:   PHYSICIAN:  Zachary George, DO                      DATE OF BIRTH:  1941/07/12   DATE OF CONSULTATION:  10/12/2002  DATE OF DISCHARGE:                                   CONSULTATION   [facility]   HISTORY:  [name] [name] returns to the clinic today for reevaluation.  She was  last seen on 09/29/02.  Overall, her pain seems to be getting better  following her fall a few weeks ago.  She continues to complain mainly of  left lower back pain and is status post radiofrequency neuroablation of the  left lumbar facet joints on 08/25/02.  The patient was doing better until her  fall which exacerbated her lower back pain, but she states overall, things  are calming down and she feels like she is healing up.  I had prescribed  Percocet and she received a generic brand which made her sick to her stomach  and she was seen by her primary care Kaybree Williams who prescribed Darvocet after  discussing the case with me.  [name] [name] has been taking two Darvocet-N 100's  three times per day and this has been helpful.  We discussed decreasing this  medication down to avoid prolonged exposure to high dose Tylenol.  Her pain  today, is a 5-6/10 on subjective scale.  Her function and quality of life  indices have improved significantly.  Her sleep has improved as well.  I  reviewed Health and History Form and 14-Point Review of Systems.  No new  neurologic complaints.  The patient does need a refill of Bextra which has  been helpful.  She had tried Skelaxin after the fall without any  improvement.   PHYSICAL EXAMINATION:  GENERAL:  A healthy appearing female in no acute  distress.  VITAL SIGNS:  Blood pressure 144/76, pulse 82, respirations 18.  O2  saturation 96% on room air.  BACK:   Tenderness to palpation in her lumbar paraspinal muscles without any  acute spasm.  Manual muscle testing is 5/5 bilateral lower extremities.  Sensory examination is intact to light touch bilateral lower extremities.  Muscle stretch reflexes are 2+/4 bilateral patella, medial hamstrings and  Achilles today.   IMPRESSION:  1. 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  2. Degenerative disk disease of the lumbar spine with spinal stenosis.     There is no clinical evidence of lumbar radiculopathy.  No history of     neurogenic claudication.  3. Chronic low back pain.   PLAN:  1. Continue Bextra 20 mg 1 p.o. daily, #30 with two refills.  2. Decrease Darvocet-N  100 to 1 p.o. t.i.d. as needed, #90 with two refills.  3. Patient may use over-the-counter heat wraps as needed for her lower back     pain.  4. I instructed the patient to resume her home exercise program.  5. Patient to return to the clinic on as needed basis.  Will release her     back to primary care.  I will be leaving this practice in six weeks and I     notified the patient of this.  I have given her options of continuing to     see the new physicians that will take [facility] place [facility] versus continuing to follow with primary     care Hillard Goodwine, versus referral to another pain specialist.  The patient     wishes to follow up with primary care Greer Wainright until the time she feels     that she needs pain specialist and at that point, would like to be     referred to [facility]. Vear Clock, M.D.   The patient was educated in the above findings and recommendations and  understands.  There were no barriers to communication.                                                Zachary George, DO    JW/MEDQ  D:  10/12/2002  T:  10/13/2002  Job:  [id number]   cc:   Lilla Shook, M.D.  [address], [address]  [place]  [place]  [address]  Fax: [phone]

## 2010-12-26 NOTE — Consult Note (Signed)
NAME:  [name] [name], Roseman NO.:  [id number]   MEDICAL RECORD NO.:  [id number]                   PATIENT TYPE:   LOCATION:                                       FACILITY:   PHYSICIAN:  Sondra Come, D.O.                 DATE OF BIRTH:   DATE OF CONSULTATION:  06/07/2002  DATE OF DISCHARGE:                                   CONSULTATION   The patient returns to clinic today for reevaluation.  She was last seen on  05/11/2002 at which time she underwent left L2, L3, L4 medial branch blocks  and L5 dorsal ramus block.  She had significant improvement of her symptoms  for about 3 weeks following the injections.  I suspect that the duration of  the improvement is related to the use of steroid with the medial branch  blocks.  She had a response similar to this previously but it actually  lasted a little bit longer.  Initially she told me that she was feeling so  well after the injection she did not require any pain medications.  However,  I have prescribed hydrocodone 10 mg to her following injections and she then  states that she did take the medication.  She has been taking it fairly  sparingly, however, because I had only written for 30 pills.  She is  somewhat confused about her medications.  I had also prescribed Celebrex to  her approximately 6 weeks ago and she is no longer taking it and has been  somewhat noncompliant in that regard.  She also has taken Flexeril  intermittently without any improvement.  I reviewed the Health and History  Form and 14-point review of systems.  Pain today is 9/10 on a subjective  scale involving the left lower lumbar region.   PHYSICAL EXAMINATION:  Reveals a health-appearing female in no acute  distress.  Blood pressure is 151/72, pulse 85, respirations 16, O2  saturation is 97% on room air.  Examination of the back reveals increased  lumbar lordosis.  Range of motion is limited in all planes secondary to  pain.  There  is tenderness to palpation left greater than right lumbar  paraspinous.  Manual muscle testing is 5/5 bilateral lower extremities.  Sensory exam intact to light touch bilaterally lower extremities.  Muscle  stretch reflexes are 3+/4 bilateral patellar, 2+/4 bilateral medial  hamstrings and Achilles.  No ankle clonus noted.   IMPRESSION:  Chronic low back pain with lumbar facet arthropathy and  degenerative disc disease with significant transient improvement following  left lumbar medial branch blocks and L5 dorsal ramus block.  The duration of  her symptom relief is likely due to the use of steroid, although  occasionally patients will get relief of their low back pain following  medial branch blocks with local anesthetic alone for durations longer than  expected for unknown reasons.  The positive blocks, however, do suggest that  her facet joints are contributing to her low back pain despite poor response  to intraarticular injections.   PLAN:  1. Will begin physical therapy for core stabilization exercise as well as     stretching range of motion leading to a home exercise program.  2. I will discontinue Celebrex and begin Bextra 20 mg 1 p.o. q.d. #30 with 1     refill.  3. Continue Norco 10 mg/325 mg 1/2 to 1 p.o. t.i.d. as needed #60 without     refills.  I counseled the patient on the use of narcotic-based pain     medication and she understands.  She is to use this as second line agent     and to help facilitate her therapy program.  4. Would consider radiofrequency neurotomy of the left lumbar medial     branches and L5 dorsal ramus if symptoms are not improving.  Would like     her to be involved in aggressive physical therapy program for at least 4-     6 weeks prior to consideration of radiofrequency.  5. Consider neuromuscular stimulator.  6.     Continue Flexeril just as needed for spasm.  7. Patient return to clinic in 1 month for reevaluation.   Patient was educated  about findings and recommendations and understands.  No  barriers to communication.                                               Sondra Come, D.O.    JJW/MEDQ  D:  06/07/2002  T:  06/08/2002  Job:  [id number]

## 2010-12-26 NOTE — Consult Note (Signed)
[facility]  Patient:    [name] [name], [name] [name] Visit Number: [id number] MRN: [id number]          Service Type: Attending:  Sondra Come, D.O. Dictated by:   Sondra Come, D.O. Proc. Date: 02/13/02   CC:         Hal T. Stoneking, M.D.   Consultation Report  [name] [name] returns to clinic today as scheduled for reevaluation.  She was last seen on January 30, 2002 at which time I started her on Duragesic 25 mcg patch. She states that the patch did not help her at all and she had continued to take the roxicodone and ran out.  When she followed up with her orthopedic doctor he gave her a prescription for Percocet 7.5 mg with just a few pills until she got back into our clinic.  She states that the Percocet did help her to some degree but she wants to get off of the pain medication if possible. She started performing ______ Codman exercises with her left upper extremity and states that she will be able to discontinue the sling in two weeks. Currently, her pain is a 7/10 on a subjective scale but she states that she is "getting better."  She denies any low back pain today and states that the Lidoderm patches are helping significantly.  We discussed further medications. At this point I think it is appropriate to discontinue with Duragesic as it has not helped her.  Health and history form, 14 point review of systems are reviewed.  PHYSICAL EXAMINATION  GENERAL:  Healthy female in no acute distress.  VITAL SIGNS:  Blood pressure 141/70, pulse 97, respirations 16, O2 saturation 94% on room air.  EXTREMITIES:  Left upper extremity is in a sling.  NEUROLOGIC:  Neurovascularly intact.  No neurologic changes in the lower extremities including motor, sensory, and reflexes.  BACK:  There is mild tenderness to palpation lumbar paraspinals and mildly increased discomfort with extension, but overall improved.  PLAN: 1. Long discussion with [name] [name] regarding  medications.  At this point it is    appropriate to transition her to Norco 10/325 mg one-half to one p.o.    t.i.d. as needed #60 without refills.  Ultimately, will wean her from the    narcotic based pain medication and treat her with nonnarcotic alternatives. 2. Resume Celebrex 200 mg one p.o. b.i.d. #60 with one refill.  This has been    cleared with patients orthopedist. 3. The patient is to return to clinic in three weeks for reevaluation.  The patient was educated on the above findings and recommendations and understands.  No barriers to communication. Dictated by:   Sondra Come, D.O. Attending:  Sondra Come, D.O. DD:  02/13/02 TD:  02/15/02 Job: [id number]

## 2010-12-26 NOTE — Consult Note (Signed)
NAME:  [name] [name], [name] [name]                         ACCOUNT NO.:  [id number]   MEDICAL RECORD NO.:  [id number]                   PATIENT TYPE:  REC   LOCATION:                                       FACILITY:   PHYSICIAN:  Zachary George, DO                      DATE OF BIRTH:  1940/12/20   DATE OF CONSULTATION:  09/29/2002  DATE OF DISCHARGE:                                   CONSULTATION   The patient returns to the clinic today for reevaluation sooner than  scheduled.  She was last seen on September 08, 2002.  She is status post  radiofrequency neurotomy of the left lumbar medial branches and L5 dorsal  ramus.  She states that she was improving until September 13, 2002, when she  slipped on ice and fell onto her back and hit her head.  She was seen by her  primary care [name] [name] at that time.  Her primary care [name] [name] contacted our  office and notified us that they were prescribing Darvocet to her because of  hydrocodone was not working.  Apparently the patient had been on Darvocet  some time ago and that seemed to help at the time.  However, the patient  states that Darvocet is not helping.  She now complains of pain in her neck,  shoulder, arms, back, and hips and feels like the pain has worsened.  She  was kept off work for two weeks and felt better.  She went back to work and  now symptoms are worse.  Her pain is a 9/10 on a subjective scale.  Function  and quality of life indices have declined.  Sleep is poor.  I reviewed the  health and history form and 14-point review of systems.   PHYSICAL EXAMINATION:  GENERAL APPEARANCE:  A healthy-appearing female in  moderate distress.  Gait is antalgic and slow.  The patient has difficulty  getting up from a chair to the examination table secondary to pain.  VITAL SIGNS:  Blood pressure 146/57, pulse 115, respirations 17, O2  saturation 95% on room air.  BACK:  Level pelvis without scoliosis.  There is significant tenderness to  palpation in the  cervical, thoracic, and lumbar paraspinous muscles.  There  is also significant tenderness to palpation across the periscapular muscles  as well.  Range of motion of the cervical spine is limited and guarded.  Range of motion of the lumbar spine is guarded as well.  EXTREMITIES:  There is diffuse tenderness to palpation in the upper and  lower extremities.  NEUROLOGIC:  Manual muscle testing is 5/5 in bilateral lower extremities.  Sensory examination is intact to light touch in bilateral lower extremities.  Muscle stretch reflexes are symmetric in bilateral upper and lower  extremities.   IMPRESSION:  1. Myofascial pain syndrome.  2. Chronic low back pain with exacerbation.  The  patient has lumbar facet     syndrome and degenerative disk disease of the lumbar spine, but I feel     that her symptoms today are more myofascial.  3. Cervicalgia, mechanical  myofascial.   PLAN:  1. Will begin Skelaxin 800 mg one p.o. three to four times per day, #16     sample pills provided.  2. Celebrex acute pain pack.  This is to be taken today.  Today, #15 samples     were provided.  Will then have the patient start Celebrex 200 mg b.i.d.  3. Percocet 10 mg/650 mg one-half to one p.o. t.i.d. as needed, #30 without     refills.  4. Will keep the patient off work for one week and reevaluate next week.   The patient was educated on the above findings and recommendations and  understands.  There were no barriers to communication.                                               Zachary George, DO    JW/MEDQ  D:  09/29/2002  T:  09/29/2002  Job:  [id number]

## 2010-12-26 NOTE — Consult Note (Signed)
   NAME:  [name] [name], [name] [name]                         ACCOUNT NO.:  [id number]   MEDICAL RECORD NO.:  [id number]                   PATIENT TYPE:  REC   LOCATION:  [facility]                                  FACILITY:  [facility]   PHYSICIAN:  Zachary George, DO                      DATE OF BIRTH:  [birth year]   DATE OF CONSULTATION:  09/08/2002  DATE OF DISCHARGE:                                   CONSULTATION   HISTORY OF PRESENT ILLNESS:  The patient returns to clinic today for  reevaluation.  She is status post radiofrequency neuroablation of the left  lumbar medial branches two weeks ago.  She states her pain seems to be  improving gradually.  I informed her that the peak effect of the procedure  may not occur until approximately six weeks post procedure.  Her pain today  is 5 to 6/10 on subjective scale which is improved.  Her function and  quality of life indices have improved.  Her sleep is better as well.  She  continues on Norco 10 mg 2-1/2 to 3 tablets per day for pain.  This is  decreased from previous usage pattern.  I reviewed the health and history  form and 14-point Review of Systems.   The patient denies any new neurologic complaints.  Denies bowel or bladder  dysfunction.   PHYSICAL EXAMINATION:  VITAL SIGNS:  Blood pressure 148/65, pulse 87,  respirations 20, O2 saturation 97% on room air.  BACK:  Examination of the back reveals level pelvis without scoliosis.  There was minimal tenderness to palpation bilateral lumbar paraspinous.  Range of motion does reproduce patient's pain, especially with extension  more so than flexion.  There were no signs or symptoms of infection.  NEUROLOGIC:  Examination intact in lower extremities including motor,  sensory, and reflexes.   IMPRESSION:  1. 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  2. Degenerative disk disease of lumbar spine.  3. Chronic low back pain.   PLAN:  1. Continue Norco 10 mg/325 mg 1 p.o. t.i.d. as needed,  #60 without refills.  2. Continue Bextra 20 mg 1 p.o. daily.  3. Continue home exercise program.  4. The patient will return to clinic in one month for reevaluation.   The patient was educated about findings and recommendations and understands.  No barriers to communication.                                               Zachary George, DO    JW/MEDQ  D:  09/08/2002  T:  09/08/2002  Job:  [id number]

## 2010-12-26 NOTE — Consult Note (Signed)
NAME:  [name] [name], [name] [name]                         ACCOUNT NO.:  [id number]   MEDICAL RECORD NO.:  [id number]                   PATIENT TYPE:  REC   LOCATION:  [facility]                                  FACILITY:  [facility]   PHYSICIAN:  Sondra Come, D.O.                 DATE OF BIRTH:  1940/12/23   DATE OF CONSULTATION:  05/11/2002  DATE OF DISCHARGE:                                   CONSULTATION   REASON FOR CONSULTATION:  The patient returns to clinic today for re-  evaluation and possible left lumbar medial branch blocks diagnostically and  therapeutically.  The patient continues to have severe pain in her left  lower back.  I had been following her for quite some time.  Essentially the  only thing that has really helped her was medial branch blocks performed on  12/02/01, which gave her several months of relief.  During this time, she  essentially stopped taking pain medications and was doing quite well until  she broke her left humeral head.  Since that time, her back pain has  progressively worsened.  I did give her a trial of lumbar facet injections  intra-articularly which really did not seem to help her so much.  She wishes  to repeat the medial branch blocks which I performed in April, and I think  this is reasonable as this did seem to give her significant improvement of  her pain for several months.  I reviewed health and history form and 14  point review of systems.  The patient's pain is an 8/10 on a subjective  scale.  Function and quality of life indices have declined.  Sleep is fair  to poor.  She continues on Norco 10 mg/325 mg up to t.i.d. p.r.n. pain.  She  also continues on Celebrex 200 mg q.d., and Flexeril p.r.n.   PHYSICAL EXAMINATION:  GENERAL:  A healthy female in no acute distress.  VITAL SIGNS:  Blood pressure 120/49, pulse 74, respirations 20, O2  saturation 97% on room air.  NEUROLOGIC:  No neurologic changes in the lower extremities, including  motor, sensory,  and reflexes.  There is significant tenderness to palpation  in the left lumbar paraspinal region which is increased with extension and  extension plus rotation to the left.   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   PLAN:  1. It is reasonable to proceed at this point with repeat medial branch     blocks for not only diagnostic, but also therapeutic effect, as this did     seem to help her for several months previously.  I discussed the risks,     benefits, limitations, and alternatives of the procedure, and the patient     understands and wishes to proceed.  2. Continue with Norco 10 mg/325 mg 1/2 to 1  p.o. b.i.d. p.r.n. #30 without     refills.  Ideally, we will be able to decrease the narcotic pain     medication with the interventional procedure.  3. Continue Celebrex 200 mg q.d.  4. Can use Flexeril just p.r.n. for spasms.  5. We will give the patient another trial of Lidoderm 5% apply up to 12     hours per day #30 with two refills.  6. The patient is to return to clinic in two weeks for re-evaluation.   PROCEDURE:  Left L2, L3, L4, medial branch blocks, and L5 dorsal ramus  block.   DESCRIPTION OF PROCEDURE:  The procedure was described to the patient in  detail, including the risks, benefits, limitations, and alternatives.  The  patient wishes to proceed.  Informed consent was obtained.  The patient was  brought back to the fluoroscopy suite and placed on the table in prone  position.  Skin was prepped and draped in usual sterile fashion.  Skin and  subcutaneous tissues were anesthetized with 2 cc of 1% Lidocaine at four  independent needle access points.  Under direct fluoroscopic guidance, a 22  gauge 3.5 inch spinal needle was advanced to the junction of the left L1  superior articular process with the sacroiliac as well as the junction of  L3, L4, and L5 superior articular processes with  their corresponding  transverse processes.  Once bony contact was made, negative aspiration was  performed at each site.  Each site was then injected with 0.5 cc of  preservative-free 0.25% bupivacaine, plus 0.25 cc of Kenalog 40 mg per cc.  The patient tolerated the procedure well.  Discharge instructions given.  No  complications.  The patient was monitored and released in stable condition.   The patient was educated on the above findings and recommendations and  understands.  There were no barriers to communication.                                                Sondra Come, D.O.    JJW/MEDQ  D:  05/11/2002  T:  05/12/2002  Job:  [id number]

## 2010-12-26 NOTE — Discharge Summary (Signed)
NAME:  [name] [name], [name] [name]               ACCOUNT NO.:  [id number]   MEDICAL RECORD NO.:  [id number]         PATIENT TYPE:  LINP   LOCATION:                               FACILITY:  [facility]   PHYSICIAN:  Deirdre Peer. Polite, M.D. DATE OF BIRTH:  07/12/1941   DATE OF ADMISSION:  06/05/2008  DATE OF DISCHARGE:  06/06/2008                               DISCHARGE SUMMARY   DISCHARGE DIAGNOSES:  1. Viral gastroenteritis resolved.  2. Medical noncompliance with poor outpatient followup.  3. Chronic obstructive pulmonary disease.  4. Coronary artery disease.  5. Chronic back pain.  6. Hypokalemia.  7. Vaginitis treated empirically with Diflucan.   DISCHARGE MEDICATIONS:  1. Theophylline 90 mg daily.  2. Combivent 2 puffs q.4 hours p.r.n.  3. Plavix 75 mg daily.  4. Zocor 80 mg daily.  5. Aspirin 81 mg daily.  6. Advair 1 inhalation b.i.d.  7. Continue Coreg 6.25 daily.   DISPOSITION:  The patient discharged to home in stable condition.  The  patient has had poor followup over the last 2 years in the office.  She  has had multiple hospitalizations without followup.  The patient  recommended to follow up in the office at discharge from hospital,  otherwise risk being discharged from practice.   HISTORY OF PRESENT ILLNESS:  A 70 year old female presented to the ED  with complaint of nausea and vomiting.  In the ED, the patient was  evaluated.  Admission was deemed necessary for further evaluation and  treatment.   PAST MEDICAL HISTORY:  Per admission history and physical.   SOCIAL HISTORY:  Per admission history and physical.   MEDICINES ON ADMISSION:  Per admission history and physical.   HOSPITAL COURSE:  1. Nausea and vomiting, probable viral gastroenteritis.  The patient      was treated with IV fluids and antiemetics.  Symptoms quickly      resolved.  The next day, the patient wanted to eat.  Her abdominal      exam was benign.  Diet was initiated with full tolerance.  2. Chronic  obstructive pulmonary disease.  The patient has frequent      exacerbations of bronchitis.  At times also has pseudo-wheezing      secondary to forced exhalation.  The patient's pulmonary status was      stable during this hospitalization.  3. Vaginitis treated empirically with Diflucan.  4. Poor medical followup, as stated above.  The patient has several      other chronic comorbidities.  She will continue her current meds.      However, we are unsure if she is taking those meds.      Deirdre Peer. Polite, M.D.  Electronically Signed     RDP/MEDQ  D:  07/08/2008  T:  07/08/2008  Job:  [id number]

## 2010-12-26 NOTE — Procedures (Signed)
EEG NUMBER:  [id number]   Mrs. [name] [name] is an outpatient who is seen today for an EEG ordered by Dr.  Deneen Harts.   The patient's history is given as having dizziness for the last 2 months and  feeling as if she could pass out but she has so far not lost consciousness.  The patient has a history of asthma. Medications for asthma were listed but  not by name.   This is 16-channel EEG recording with one channel representing heart rate  and rhythm exclusively. The patient was exposed to photic stimulation,  hyperventilation was deferred due to her asthma history. She is described as  a right-handed 70 year old female with a date of birth May 09, 1941 that  was pleasant and cooperative and fully oriented.   Posterior dominant rhythm shows a well-formed rhythm of 9 Hz that emits  bilaterally symmetrically from the posterior hemispheres,  swaying of  electrodes is seen over the right frontal parietal region and seems to be a  sweat-induced artifact. The EKG remains in normal sinus rhythm throughout.  Often the patient became drowsy during this recording as indicated by theta  range frequencies anteriorly, she did not reach deeper stages of sleep,  there were never vertex sharp waves or spindle formations noticed.  Hyperventilation was deferred. Photic stimulation did not lead to photic  entrainment at various tried flash rates.   CONCLUSION:  This is a normal EEG for the patient's age and conscious state.  No epileptiform discharge is noted.           ______________________________  Melvyn Novas, M.D.     HQ:IONG  D:  05/09/2005 09:35:12  T:  05/09/2005 10:40:07  Job #:  [id number]

## 2010-12-26 NOTE — Consult Note (Signed)
NAME:  [name] [name], [name] [name]                         ACCOUNT NO.:  [id number]   MEDICAL RECORD NO.:  [id number]                   PATIENT TYPE:  REC   LOCATION:  [facility]                                  FACILITY:  [facility]   PHYSICIAN:  Sondra Come, D.O.                 DATE OF BIRTH:  04-13-1941   DATE OF CONSULTATION:  04/05/2002  DATE OF DISCHARGE:                  PHYSICAL MEDICINE & REHABILITATION CONSULTATION   The patient returns to clinic today as scheduled for reevaluation.  She was  last seen on March 06, 2002.  In the interim we called in some hydrocodone  for her as she was using it consistently t.i.d. for worsening low back pain  and persistent left shoulder pain status post fracture.  She states that her  shoulder seems to be improving but her low back pain is worsening on the  left.  She denies radicular symptoms.  I review health and history form and  14 point review of systems.  The patient's pain today is a 9/10 on a  subjective scale.  Overall her function and quality of life indexes have  improved but she feels like her low back pain has caused her functional  abilities to plateau to some degree at this point.  She has received  improvement in the past with lumbar facet medial branch blocks but her  response was somewhat atypical as she got significantly longer relief than  the anesthetic phase.   PHYSICAL EXAMINATION:  GENERAL:  Healthy female in no acute distress.  VITAL SIGNS:  Blood pressure 134/58, pulse 83, respirations 16, O2  saturation 98% on room air.  BACK:  Level pelvis without scoliosis.  There is increased pain on extension  as well as extension plus rotation, especially to the left.  Flexion causes  a pulling sensation in the left lumbar paraspinal region.  Palpatory  examination reveals significant tenderness to palpation left lumbar  paraspinal muscles.  Deep palpation over the facet joints reproduces  patient's pain on the left.  NEUROLOGIC:  Manual  muscle testing is 5/5 bilateral lower extremities.  Sensory examination intact to light touch bilateral lower extremities.  Muscle stretch reflexes are symmetric bilateral lower extremities.   IMPRESSION:  1. Lumbar facet syndrome/arthropathy.  2. Left shoulder pain status post left humeral head fracture, healing.  3. Cervicalgia with degenerative disk disease of the cervical spine.   PLAN:  1. Had a long discussion with the patient regarding treatment options.  At     this point it is reasonable to proceed with left L4-5 and L5-S1 facet     joint injections.  The patient wishes to proceed in this fashion.  I     reviewed the procedure with the patient.  2. Continue Norco 10 mg one-half to one p.o. t.i.d. as needed number 30     without refills.  Eventually will wean patient from narcotic based pain     medication as her pain  decreases.  3. The patient is to continue with home exercise program.  4. Discontinue Flexeril.  5. Continue Celebrex.  6. The patient is to return to clinic in one month for reevaluation.  Would     consider repeating injections if symptoms return.   PROCEDURE:  Left L4-5 and L5-S1 facet joint injections.  Procedure was  described to patient in detail including risks, benefits, limitations, and  alternatives.  The patient wishes to proceed.  Informed consent was  obtained.  The patient was brought back to the fluoroscopy suite and placed  on the table in prone position.  Skin was prepped and draped in usual  sterile fashion.  Skin and subcutaneous tissues were anesthetized with 4 cc  of 1% lidocaine at two independent needle access points.  Under direct  fluoroscopic guidance a 22-gauge 3.5 inch spinal needle was advanced into  the left L4-5 facet joint under oblique view.  Needle tip placement was  confirmed with multiple fluoroscopic views.  It was also confirmed after  negative aspiration with the injection of 0.25 cc of Omnipaque 180 revealing  a left L4-5  facet joint arthrogram.  This was then followed by the injection  of 0.5 cc of Kenalog 40 mg per cc plus 1 cc of 1% lidocaine with needle  flush.  The spinal needle was then advanced under AP view to the inferior  recess of the left L5-S1 facet joint.  Needle tip placement was confirmed  under multiple fluoroscopic views.  It was further confirmed after negative  aspiration with the injection of 0.25 cc of Omnipaque 180 revealing a left  L5-S1 facet joint arthrogram.  This was then followed by the injection of  0.5 cc of Kenalog 40 mg per cc plus 1 cc of 1% lidocaine with needle flush.  The patient tolerated procedure well.  Discharge instructions given.  The  patient was instructed to use ice as needed.  Incidentally, it was noted  that patient has a partially lumbarized S1 vertebra.   The patient was educated on the above findings and recommendations and  understands.  No barriers to communication.                                               Sondra Come, D.O.    JJW/MEDQ  D:  04/05/2002  T:  04/05/2002  Job:  [id number]

## 2011-03-10 ENCOUNTER — Inpatient Hospital Stay (HOSPITAL_COMMUNITY)
Admission: EM | Admit: 2011-03-10 | Discharge: 2011-03-16 | DRG: 066 | Disposition: A | Discharge status: Skilled Nursing Facility | Payer: Medicare Other | Attending: Internal Medicine | Admitting: Internal Medicine

## 2011-03-10 ENCOUNTER — Emergency Department (HOSPITAL_COMMUNITY): Payer: Medicare Other

## 2011-03-10 DIAGNOSIS — M1990 Unspecified osteoarthritis, unspecified site: Secondary | ICD-10-CM | POA: Diagnosis present

## 2011-03-10 DIAGNOSIS — R471 Dysarthria and anarthria: Secondary | ICD-10-CM | POA: Diagnosis present

## 2011-03-10 DIAGNOSIS — E785 Hyperlipidemia, unspecified: Secondary | ICD-10-CM | POA: Diagnosis present

## 2011-03-10 DIAGNOSIS — K5900 Constipation, unspecified: Secondary | ICD-10-CM | POA: Diagnosis present

## 2011-03-10 DIAGNOSIS — F17200 Nicotine dependence, unspecified, uncomplicated: Secondary | ICD-10-CM | POA: Diagnosis present

## 2011-03-10 DIAGNOSIS — G4730 Sleep apnea, unspecified: Secondary | ICD-10-CM | POA: Diagnosis present

## 2011-03-10 DIAGNOSIS — I6340 Cerebral infarction due to embolism of unspecified cerebral artery: Principal | ICD-10-CM | POA: Diagnosis present

## 2011-03-10 DIAGNOSIS — E119 Type 2 diabetes mellitus without complications: Secondary | ICD-10-CM | POA: Diagnosis present

## 2011-03-10 DIAGNOSIS — K219 Gastro-esophageal reflux disease without esophagitis: Secondary | ICD-10-CM | POA: Diagnosis present

## 2011-03-10 DIAGNOSIS — G8929 Other chronic pain: Secondary | ICD-10-CM | POA: Diagnosis present

## 2011-03-10 DIAGNOSIS — Z79899 Other long term (current) drug therapy: Secondary | ICD-10-CM

## 2011-03-10 DIAGNOSIS — E669 Obesity, unspecified: Secondary | ICD-10-CM | POA: Diagnosis present

## 2011-03-10 DIAGNOSIS — R29898 Other symptoms and signs involving the musculoskeletal system: Secondary | ICD-10-CM | POA: Diagnosis present

## 2011-03-10 DIAGNOSIS — I2510 Atherosclerotic heart disease of native coronary artery without angina pectoris: Secondary | ICD-10-CM | POA: Diagnosis present

## 2011-03-10 DIAGNOSIS — R29810 Facial weakness: Secondary | ICD-10-CM | POA: Diagnosis present

## 2011-03-10 DIAGNOSIS — J449 Chronic obstructive pulmonary disease, unspecified: Secondary | ICD-10-CM | POA: Diagnosis present

## 2011-03-10 DIAGNOSIS — D72829 Elevated white blood cell count, unspecified: Secondary | ICD-10-CM | POA: Diagnosis present

## 2011-03-10 DIAGNOSIS — J329 Chronic sinusitis, unspecified: Secondary | ICD-10-CM | POA: Diagnosis present

## 2011-03-10 DIAGNOSIS — Z7982 Long term (current) use of aspirin: Secondary | ICD-10-CM

## 2011-03-10 DIAGNOSIS — I10 Essential (primary) hypertension: Secondary | ICD-10-CM | POA: Diagnosis present

## 2011-03-10 DIAGNOSIS — Z87440 Personal history of urinary (tract) infections: Secondary | ICD-10-CM

## 2011-03-10 DIAGNOSIS — M545 Low back pain, unspecified: Secondary | ICD-10-CM | POA: Diagnosis present

## 2011-03-10 DIAGNOSIS — I252 Old myocardial infarction: Secondary | ICD-10-CM

## 2011-03-10 DIAGNOSIS — J4489 Other specified chronic obstructive pulmonary disease: Secondary | ICD-10-CM | POA: Diagnosis present

## 2011-03-10 LAB — CBC
HCT: 37 % (ref 36.0–46.0)
Hemoglobin: 12.4 g/dL (ref 12.0–15.0)
MCH: 30.5 pg (ref 26.0–34.0)
MCHC: 33.5 g/dL (ref 30.0–36.0)
MCV: 91.1 fL (ref 78.0–100.0)
RBC: 4.06 MIL/uL (ref 3.87–5.11)

## 2011-03-10 LAB — DIFFERENTIAL
Lymphocytes Relative: 14 % (ref 12–46)
Lymphs Abs: 2 10*3/uL (ref 0.7–4.0)
Monocytes Absolute: 1.1 10*3/uL — ABNORMAL HIGH (ref 0.1–1.0)
Monocytes Relative: 7 % (ref 3–12)
Neutro Abs: 11.8 10*3/uL — ABNORMAL HIGH (ref 1.7–7.7)
Neutrophils Relative %: 79 % — ABNORMAL HIGH (ref 43–77)

## 2011-03-10 LAB — COMPREHENSIVE METABOLIC PANEL
ALT: 7 U/L (ref 0–35)
AST: 10 U/L (ref 0–37)
Albumin: 3.5 g/dL (ref 3.5–5.2)
CO2: 25 mEq/L (ref 19–32)
Calcium: 9.4 mg/dL (ref 8.4–10.5)
Chloride: 99 mEq/L (ref 96–112)
Creatinine, Ser: <0.47 mg/dL — ABNORMAL LOW (ref 0.50–1.10)
Sodium: 136 mEq/L (ref 135–145)
Total Bilirubin: 0.3 mg/dL (ref 0.3–1.2)

## 2011-03-10 LAB — URINALYSIS, ROUTINE W REFLEX MICROSCOPIC
Glucose, UA: NEGATIVE mg/dL
Hgb urine dipstick: NEGATIVE
Specific Gravity, Urine: 1.031 — ABNORMAL HIGH (ref 1.005–1.030)

## 2011-03-10 LAB — LACTIC ACID, PLASMA: Lactic Acid, Venous: 1.2 mmol/L (ref 0.5–2.2)

## 2011-03-10 LAB — PROTIME-INR: Prothrombin Time: 13.5 seconds (ref 11.6–15.2)

## 2011-03-10 LAB — CK TOTAL AND CKMB (NOT AT ARMC): Relative Index: 1.9 (ref 0.0–2.5)

## 2011-03-10 LAB — TROPONIN I: Troponin I: <0.3 ng/mL (ref <0.30)

## 2011-03-10 LAB — APTT: aPTT: 28 seconds (ref 24–37)

## 2011-03-10 LAB — LIPASE, BLOOD: Lipase: 8 U/L — ABNORMAL LOW (ref 11–59)

## 2011-03-10 MED ORDER — GADOBENATE DIMEGLUMINE 529 MG/ML IV SOLN
15.0000 mL | Freq: Once | INTRAVENOUS | Status: AC
  Administered 2011-03-10: 15 mL via INTRAVENOUS

## 2011-03-11 DIAGNOSIS — I369 Nonrheumatic tricuspid valve disorder, unspecified: Secondary | ICD-10-CM

## 2011-03-11 LAB — GLUCOSE, CAPILLARY
Glucose-Capillary: 91 mg/dL (ref 70–99)
Glucose-Capillary: 105 mg/dL — ABNORMAL HIGH (ref 70–99)
Glucose-Capillary: 122 mg/dL — ABNORMAL HIGH (ref 70–99)

## 2011-03-11 LAB — URINE CULTURE
Culture  Setup Time: 201207311557
Culture: NO GROWTH

## 2011-03-11 LAB — BASIC METABOLIC PANEL
Chloride: 103 mEq/L (ref 96–112)
Potassium: 3.3 mEq/L — ABNORMAL LOW (ref 3.5–5.1)
Sodium: 136 mEq/L (ref 135–145)

## 2011-03-11 LAB — CBC
HCT: 36.4 % (ref 36.0–46.0)
Hemoglobin: 11.6 g/dL — ABNORMAL LOW (ref 12.0–15.0)
RBC: 3.93 MIL/uL (ref 3.87–5.11)
WBC: 8.6 10*3/uL (ref 4.0–10.5)

## 2011-03-11 LAB — CK TOTAL AND CKMB (NOT AT ARMC)
CK, MB: 2.8 ng/mL (ref 0.3–4.0)
Total CK: 155 U/L (ref 7–177)

## 2011-03-11 LAB — DIFFERENTIAL
Basophils Absolute: 0.1 10*3/uL (ref 0.0–0.1)
Basophils Relative: 1 % (ref 0–1)
Lymphocytes Relative: 34 % (ref 12–46)
Monocytes Absolute: 0.7 10*3/uL (ref 0.1–1.0)
Neutro Abs: 4.8 10*3/uL (ref 1.7–7.7)
Neutrophils Relative %: 56 % (ref 43–77)

## 2011-03-11 LAB — LIPID PANEL
Cholesterol: 212 mg/dL — ABNORMAL HIGH (ref 0–200)
HDL: 37 mg/dL — ABNORMAL LOW (ref >39)
Total CHOL/HDL Ratio: 5.7 RATIO

## 2011-03-11 LAB — HEMOGLOBIN A1C
Hgb A1c MFr Bld: 6.6 % — ABNORMAL HIGH (ref <5.7)
Mean Plasma Glucose: 143 mg/dL — ABNORMAL HIGH (ref <117)

## 2011-03-12 ENCOUNTER — Inpatient Hospital Stay (HOSPITAL_COMMUNITY): Payer: Medicare Other

## 2011-03-12 DIAGNOSIS — G459 Transient cerebral ischemic attack, unspecified: Secondary | ICD-10-CM

## 2011-03-12 LAB — GLUCOSE, CAPILLARY
Glucose-Capillary: 113 mg/dL — ABNORMAL HIGH (ref 70–99)
Glucose-Capillary: 112 mg/dL — ABNORMAL HIGH (ref 70–99)

## 2011-03-13 LAB — GLUCOSE, CAPILLARY
Glucose-Capillary: 112 mg/dL — ABNORMAL HIGH (ref 70–99)
Glucose-Capillary: 118 mg/dL — ABNORMAL HIGH (ref 70–99)
Glucose-Capillary: 117 mg/dL — ABNORMAL HIGH (ref 70–99)
Glucose-Capillary: 104 mg/dL — ABNORMAL HIGH (ref 70–99)

## 2011-03-13 LAB — BASIC METABOLIC PANEL
Chloride: 99 mEq/L (ref 96–112)
Potassium: 3.2 mEq/L — ABNORMAL LOW (ref 3.5–5.1)
Sodium: 136 mEq/L (ref 135–145)

## 2011-03-13 LAB — CBC
MCHC: 32 g/dL (ref 30.0–36.0)
Platelets: 362 10*3/uL (ref 150–400)
RDW: 13.7 % (ref 11.5–15.5)
WBC: 8.3 10*3/uL (ref 4.0–10.5)

## 2011-03-14 LAB — GLUCOSE, CAPILLARY
Glucose-Capillary: 105 mg/dL — ABNORMAL HIGH (ref 70–99)
Glucose-Capillary: 116 mg/dL — ABNORMAL HIGH (ref 70–99)

## 2011-03-15 LAB — GLUCOSE, CAPILLARY
Glucose-Capillary: 106 mg/dL — ABNORMAL HIGH (ref 70–99)
Glucose-Capillary: 115 mg/dL — ABNORMAL HIGH (ref 70–99)
Glucose-Capillary: 175 mg/dL — ABNORMAL HIGH (ref 70–99)

## 2011-03-16 LAB — GLUCOSE, CAPILLARY

## 2011-03-17 LAB — CULTURE, BLOOD (ROUTINE X 2)
Culture  Setup Time: 201208010106
Culture: NO GROWTH

## 2011-03-17 NOTE — Discharge Summary (Signed)
[name] [name], [name] [name] NO.:  [id number]  MEDICAL RECORD NO.:  [id number]  LOCATION:  3011                         FACILITY:  [facility]  PHYSICIAN:  Zannie Cove, MD     DATE OF BIRTH:  Apr 19, 1941  DATE OF ADMISSION:  03/10/2011 DATE OF DISCHARGE:                        DISCHARGE SUMMARY - REFERRING   PRIMARY CARE PHYSICIAN:  Medical laboratory scientific officer.  DISCHARGE DIAGNOSES: 1. 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 2. Chronic obstructive pulmonary disease. 3. Hypertension. 4. Chronic back pain. 5. Gastroesophageal reflux disease. 6. Degenerative joint disease. 7. History of coronary artery disease. 8. Dyslipidemia. 9. History of cholecystectomy. 10.Chronic sinusitis. 11.History of vertebral compression fractures.  DISCHARGE MEDICATIONS: 1. Tylenol 650 mg q.4 h p.r.n. 2. Aspirin 325 mg daily. 3. Crestor 10 mg daily. 4. Tramadol 100 mg p.o. q.6 h p.r.n. 5. Advair HFA 115/21 mcg 2 puffs inhaled b.i.d. 6. Estrace Vaginal Cream 0.5 mg/g one application daily. 7. Percocet 5/325 q.4 h p.r.n. for pain. 8. Phenergan 25 mg q.4 h p.r.n. for nausea and vomiting. 9. Trazodone 100 mg tablet, 1/2 to 1-1/2 tablet p.o. nightly as     needed. 10.Xanax 0.5 mg tablet, 1/2 to 1 tablet p.o. b.i.d. p.r.n. 11.Zaleplon 10 mg p.o. nightly.  CONSULTANTS:  Pramod P. Pearlean Brownie, MD with [facility].  DIAGNOSTIC INVESTIGATIONS: 1. X-ray of the pelvis.  No acute abnormalities. 2. CT of the head.  No acute intracranial changes. 3. MRI of the brain showed right MCA distribution infarct. 4. MRA showed intracranial atherosclerotic changes.  Most notable     finding is right MCA distribution infarcts and irregular high-grade     stenosis, right MCA superior proximal branch, __________ stenosis     and/or embolus. 5. Bilateral internal carotid aneurysm suspecting, measuring 7.1 mm on     the left and 4.1 mm on the right. 6. 2-D echo which  showed LV cavity size normal.  Apical and septal     akinesia.  EF was 45%. 7. TEE showed LV systolic function and EF of 40%.  Akineses with     distal anteroseptal myocardium.  No PFO.  No evidence of left     atrial embolus.  HOSPITAL COURSE:  [name] is a 70 year old white female who presented to the hospital with dysarthria and left facial droop and left-sided weakness.  Upon evaluation, she was found to have right MCA distribution infarct which was suspected to be embolic in nature.  This was detected on MRI and felt to be embolic in nature.  She underwent 2-D echo and TEE, which resulted secondary to above.  Started by Neurology an aspirin as well as low-dose statin.  Weakness and dysarthria has improved and subsequently being discharged to skilled nursing facility for rehabilitation.  She is also going to be set up for 3-4 weeks of outpatient CardioNet monitoring for occult AFib that could cause embolic strokes in this patient.  In addition, the patient will also follow up with Dr. Pearlean Brownie of [facility] in 1 month's time.  DISCHARGE CONDITION:  Stable.  DISCHARGE FOLLOWUP:  With Dr. Delia Heady in 3 weeks.     Zannie Cove, MD  PJ/MEDQ  D:  03/16/2011  T:  03/16/2011  Job:  [id number]  Electronically Signed by Zannie Cove  on 03/17/2011 05:32:45 PM

## 2011-03-25 NOTE — H&P (Signed)
NAME:  [name] [name], [name] [name] NO.:  [id number]  MEDICAL RECORD NO.:  [id number]  LOCATION:  [facility]                         FACILITY:  [facility]  PHYSICIAN:  Jeoffrey Massed, MD    DATE OF BIRTH:  November 23, 1940  DATE OF ADMISSION:  03/10/2011 DATE OF DISCHARGE:                             HISTORY & PHYSICAL   PRIMARY CARE PRACTITIONER:  The patient does not remember the name currently.  CHIEF COMPLAINT:  Dysarthria, left-sided facial droop, fall that the patient sustained yesterday evening.  HISTORY OF PRESENT ILLNESS:  The patient is a 70 year old right-handed female with a past medical history of bronchial asthma/COPD, ongoing tobacco abuse, hypertension, chronic low back pain who presented to the ED with the above-noted complaints.  Per patient she was in a usual state of health until yesterday evening around 9:30 p.m. while watching TV, she suddenly slid off the sofa and could not get up.  She apparently was down on the floor for approximately 2 hours before she managed to drag herself to her bedroom and still could not get onto the bed.  She claims during this episode she had a left facial numbness and difficulty speaking which she cannot characterize further.  She stayed near her bed on the floor the whole night per her and this morning when home health aide came in, she helped her up and called EMS and was brought to the ED.  Apparently, the patient urinated and had a bowel movement while on the floor.  Per EMS, the patient was noted to be slightly confused and had slurred speech and was then brought to the ED for further evaluation.  Please note that this patient was not considered a TPA candidate because of delay in arriving to the ED as apparently all of this happened last evening.  The patient denies any headache, chest pain, shortness of breath, nausea, vomiting, diarrhea.  She has now been referred to the hospitalist service for further evaluation  and treatment.  ALLERGIES:  The patient is apparently allergic to CODEINE and SULFA.  PAST MEDICAL HISTORY: 1. Bronchial asthma/COPD. 2. Hypertension. 3. Chronic back pain. 4. Constipation. 5. Gastroesophageal reflux disease. 6. Degenerative joint disease. 7. Chronic sinusitis. 8. Questionable coronary artery disease. 9. Questionable history of congestive heart failure. 10.Obesity.  PAST SURGICAL HISTORY:  Noncontributory.  MEDICATIONS AT HOME: 1. Apparently, the patient takes Spiriva, Advair unknown strength. 2. She is also on Xanax at an unknown dose. 3. She also takes theophylline at an unknown strength. 4. The patient does not remember the name of her other medications.  FAMILY HISTORY:  Her mother apparently had a CVA as well.  SOCIAL HISTORY:  Lives alone.  She has a home health aide who comes out to her house on Tuesdays who cooks for her and cleans the house as well. She has a son who lives nearby.  The patient has a history of ongoing tobacco abuse and apparently tells me she smokes around half a pack to one pack a day.  She denies any other toxic habits.  REVIEW OF SYSTEMS:  A detailed review of 12 systems was done and these are negative except for ones noted in the HPI.  PHYSICAL EXAMINATION:  GENERAL:  Lying in bed, does not appear to be in any distress.  Has a obvious left-sided facial droop, very mild dysarthria.  Otherwise speech is clear.Marland Kitchen HEENT:  Atraumatic, normocephalic.  Pupils are equally reactive to light and accommodation. NECK:  Supple. CHEST:  Bilaterally clear to auscultation. CARDIOVASCULAR:  Heart sounds are regular. ABDOMEN:  Soft, nontender, nondistended. EXTREMITIES:  No edema. NEUROLOGIC:  The patient is awake, alert, slight to very minimal dysarthria, obvious left facial droop.  No other cranial nerve deficits. On a motor exam, she does have a left pronator drift.  Her left upper extremity strength is around 4/5, so is a strength on  the left lower extremity.  Her right lower extremity and the right upper extremity around 5/5.  LABORATORY DATA: 1. CBC shows a WBC of 15.0, hemoglobin of 12.4, hematocrit of 37.0 and     a platelet count of 377. 2. Lactic acid is 1.2. 3. First set of cardiac enzymes are negative. 4. Chemistry shows a sodium of 136, potassium of 3.7, chloride of 99,     bicarb of 25, glucose of 102, BUN of 16, creatinine of 0.47 and a     calcium of 9.4. 5. LFT shows a total bilirubin of 0.3, alkaline phosphatase of 72, AST     of 10, ALT of 7, total protein of 6.8 and albumin of 3.5. 6. Urinalysis does not show any UTI. 7. Lipase is negative.  RADIOLOGICAL STUDIES:  CT of the head shows no acute intracranial findings.  Chronic changes as above.  ASSESSMENT: 1. Likely right-sided cerebrovascular accident with left-sided     deficits on exam. 2. Chronic obstructive pulmonary disease/asthma, stable. 3. Hypertension, currently controlled. 4. History of chronic back pain.  PLAN: 1. The patient will be admitted to a neuro telemetry unit. 2. She will be placed on aspirin. 3. She will be permitted to have some amount of permissive     hypertension.4. She will be started on Crestor. 5. Per RN, she has already passed a swallowing eval so we could start     her on a heart-healthy diet. 6. Neurology consultation will be formally obtained.  MRI does show     that she did in fact have a CVA. 7. A 2-D echo, carotid Dopplers, MRA of the brain will also be     ordered. 8. She will be placed on aspirin and statin as noted above. 9. For her asthma and COPD, she will placed on nebulized     bronchodilators. 10.PT and OT consults will be obtained. 11.She will be placed on Lovenox for DVT prophylaxis. 12.Further plan will depend as the patient's clinical course evolves. 13.She does have leukocytosis.  Her chest x-ray does not show any     evidence of pneumonia and her UA is also negative.  Perhaps this is      all reactive to CVA and we will continue to monitor her.  She does     not have fever and does not even have a toxic appearance for now. 14.Code status, full code.  Total time spent for admission 55 minutes.     Jeoffrey Massed, MD     SG/MEDQ  D:  03/10/2011  T:  03/10/2011  Job:  [id number]  Electronically Signed by Jeoffrey Massed  on 03/25/2011 05:44:29 PM

## 2011-04-29 NOTE — Consult Note (Signed)
NAME:  [name] [name], [name] [name] NO.:  [id number]  MEDICAL RECORD NO.:  [id number]  LOCATION:  3011                         FACILITY:  [facility]  PHYSICIAN:  Melvyn Novas, M.D.  DATE OF BIRTH:  1941-02-07  DATE OF CONSULTATION:  03/10/2011 DATE OF DISCHARGE:                               STROKE CONSULTATION   HISTORY OF PRESENT ILLNESS:  This 70 year old Caucasian female arrived at the [facility] ED at 1 p.m. today as a possible CVA with unknown onset time.  Apparently, the patient's housekeeper had found her at about 8 a.m. today, but she recalls having slid to the floor and was unable to get up since about 9 p.m. the previous day which would be March 09, 2011.  The patient has an extensive past medical history and looks poorly groomed.  She lives alone and is an active smoker.  She was brought by [facility] to the [facility] ED.  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  The patient states that she does not have diabetes mellitus when I tried to confirm the past medical history as reported in her ED.  She has GERD, hypertension, history of a myocardial infarct in 2001, frequent urinary tract infections.  She states that she is normally treated in [facility].  She has no history of stroke and denies having ever had unilateral symptoms before.  The patient again was getting weak and slid to the floor.  She denies having fallen last night and was found this morning dysarthric and appearing to her housekeeper somewhat confused.  She also complained of hip pain and shortness of breath.  The patient has a following stroke risk factors obesity, emphysema, possible sleep apnea, sedentary lifestyle.  Again, she denies having diabetes mellitus and also denies to me having ever been diagnosed with hypertension.  MEDICATIONS:  Spiriva with handheld inhaler, theophylline,  Advair Diskus, she is on alprazolam, clotrimazole, betamethasone, Flagyl, potassium chloride, promethazine, Restoril and tioconazole.  ALLERGIES:  She lists no allergies to any medications.  SOCIAL HISTORY:  She is an active smoker, lives alone, and is retired from [employer].  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  FAMILY HISTORY:  Coronary artery disease and a stroke in her mother.  REVIEW OF SYSTEMS:  The patient stated she has shortness of breath.  She could not state to me if she felt weaker on the left or right side but just by looking at the patient, it is clear that she has left-sided facial droop and does not move her left upper extremity.  She has frequent upset stomach and has stated that she had a colitis, Clostridium difficile infection 2 years ago.  Again, she has coronary artery disease but denies any chest pain.  She denies psychiatric history.  PHYSICAL EXAMINATION:  VITAL SIGNS:  The patient's temperature is 97.7 degrees Fahrenheit, her blood pressure was 148/70, at 7 p.m., her heart rate is 95, but is regular, her respirations are 18 at rest.  The patient again looks poorly groomed. ABDOMEN:  Soft, not distended.  She has  positive bowel sounds. LUNGS:  She has wheezing and rales over her lungs. NEURO:  She has a facial droop on the left.  She is able to follow with her gaze and cross the midline.  She has no horizontal nystagmus but left-sided ptosis.  She has dysarthria but no aphasia and no apraxia. Her left arm drifts.  Her left grip is weaker than the right side.  She can lift both legs against gravity for 5 seconds and she has upgoing toes on both sides.  She has bilateral equal deep tendon reflexes.  Her sensory is intact to pinprick and temperature except for the mid shin level downwards and she states that she has frequently numb feet.  The patient is morbidly obese.  TEST RESULTS:  Labs show white  blood cell count of 15,000, hemoglobin/hematocrit of 12.4/37, neutrophil count 79%, lymphocytes 14%, platelet count was 377 K, capillary glucose was 104.  Troponin is less than 30.  Lactic acid 1.2.  Sodium is 136, potassium 3.7, creatinine is less than 0.47, BUN is 16, albumin 3.5.  GOT 10, GPT 7 units.  CK-MB total is 3.7 and CK total 193 units and elevated.  Her urine is amber colored but negative for leukocyte esterase.  She had some ketones but no protein.   CT was negative for stroke initially but the MRI showed scattered very small lesions in the distribution of the right middle cerebral artery.  This could be a vessel embolic event.  Dr. Darden Palmer also entertained a possible watershed infarct.  I would not rule out a cardioembolic event that may have broken up without medications.  The patient is to receive a full stroke workup including embolic stroke workup.  She will be placed on fractioned heparin subcutaneously for DVT prophylaxis and receives oral aspirin as she passed her swallowing screen meanwhile.   I use a printed order sheet for CVA patients and the patient was not a candidate for a clinical trial nor for t-PA given that she arrived with a 16-hour presumed time of onset of symptoms as even this cannot be clarified.  I was called at 9 p.m. for this patient today who arrived at the [facility] ED today at 1 p.m.     Melvyn Novas, M.D.     CD/MEDQ  D:  03/10/2011  T:  03/11/2011  Job:  [id number]  cc:   Pramod P. Pearlean Brownie, MD  Electronically Signed by Melvyn Novas M.D. on 04/29/2011 11:22:10 AM

## 2011-04-30 LAB — INFLUENZA A+B VIRUS AG-DIRECT(RAPID)
Inflenza A Ag: NEGATIVE
Influenza B Ag: NEGATIVE

## 2011-04-30 LAB — URINALYSIS, ROUTINE W REFLEX MICROSCOPIC
Ketones, ur: NEGATIVE
Nitrite: NEGATIVE
Specific Gravity, Urine: 1.004 — ABNORMAL LOW
pH: 8

## 2011-04-30 LAB — CBC
MCHC: 33.6
MCV: 95.1
RBC: 3.75 — ABNORMAL LOW

## 2011-04-30 LAB — DIFFERENTIAL
Lymphocytes Relative: 12
Lymphs Abs: 1.3
Neutro Abs: 9.5 — ABNORMAL HIGH
Neutrophils Relative %: 81 — ABNORMAL HIGH

## 2011-04-30 LAB — COMPREHENSIVE METABOLIC PANEL
AST: 12
CO2: 30
Calcium: 9.1
Creatinine, Ser: 0.48
GFR calc Af Amer: >60
GFR calc non Af Amer: >60
Glucose, Bld: 123 — ABNORMAL HIGH

## 2011-04-30 LAB — URINE CULTURE: Colony Count: NO GROWTH

## 2011-05-01 LAB — BASIC METABOLIC PANEL
BUN: 10
BUN: 4 — ABNORMAL LOW
CO2: 30
Calcium: 8.5
Calcium: 8.7
Calcium: 9
Creatinine, Ser: 0.45
Creatinine, Ser: 0.49
Creatinine, Ser: 0.62
GFR calc Af Amer: >60
GFR calc Af Amer: >60
GFR calc non Af Amer: >60
GFR calc non Af Amer: >60
Glucose, Bld: 110 — ABNORMAL HIGH
Glucose, Bld: 117 — ABNORMAL HIGH
Sodium: 136
Sodium: 142

## 2011-05-01 LAB — URINALYSIS, ROUTINE W REFLEX MICROSCOPIC
Bilirubin Urine: NEGATIVE
Nitrite: NEGATIVE
Protein, ur: NEGATIVE
Specific Gravity, Urine: 1.021
Urobilinogen, UA: 0.2
Urobilinogen, UA: 0.2
pH: 6

## 2011-05-01 LAB — URINE CULTURE: Special Requests: NEGATIVE

## 2011-05-01 LAB — URINE MICROSCOPIC-ADD ON

## 2011-05-06 LAB — CULTURE, BAL-QUANTITATIVE W GRAM STAIN: Colony Count: 3000

## 2011-05-06 LAB — URINE CULTURE
Colony Count: NO GROWTH
Culture: NO GROWTH

## 2011-05-06 LAB — COMPREHENSIVE METABOLIC PANEL
ALT: 16
AST: 42 — ABNORMAL HIGH
Albumin: 3.2 — ABNORMAL LOW
Alkaline Phosphatase: 55
BUN: 8
CO2: 22
CO2: 31
Chloride: 104
Chloride: 94 — ABNORMAL LOW
GFR calc Af Amer: >60
GFR calc non Af Amer: >60
GFR calc non Af Amer: >60
Glucose, Bld: 118 — ABNORMAL HIGH
Potassium: 3.2 — ABNORMAL LOW
Potassium: 5.2 — ABNORMAL HIGH
Sodium: 133 — ABNORMAL LOW
Total Bilirubin: 0.6
Total Bilirubin: 0.6
Total Protein: 5.5 — ABNORMAL LOW

## 2011-05-06 LAB — URINE MICROSCOPIC-ADD ON

## 2011-05-06 LAB — RAPID URINE DRUG SCREEN, HOSP PERFORMED
Cocaine: NOT DETECTED
Tetrahydrocannabinol: NOT DETECTED

## 2011-05-06 LAB — BASIC METABOLIC PANEL
BUN: 10
Calcium: 8 — ABNORMAL LOW
Calcium: 8.5
Creatinine, Ser: 0.61
GFR calc Af Amer: >60
GFR calc non Af Amer: >60
GFR calc non Af Amer: >60
Sodium: 139

## 2011-05-06 LAB — BLOOD GAS, ARTERIAL
Drawn by: 280981
Patient temperature: 98.6
pCO2 arterial: 44.8
pH, Arterial: 7.425 — ABNORMAL HIGH

## 2011-05-06 LAB — POCT CARDIAC MARKERS
CKMB, poc: 2.1
Myoglobin, poc: 214
Troponin i, poc: <0.05

## 2011-05-06 LAB — POCT I-STAT 3, ART BLOOD GAS (G3+)
Acid-Base Excess: 3 — ABNORMAL HIGH
Acid-Base Excess: 4 — ABNORMAL HIGH
Bicarbonate: 31.9 — ABNORMAL HIGH
O2 Saturation: 100
O2 Saturation: 97
Operator id: 282001
Operator id: 282221
Operator id: 299431
Patient temperature: 101.3
Patient temperature: 102.4
Patient temperature: 37.5
Patient temperature: 38.5
TCO2: 29
TCO2: 34
pCO2 arterial: 45.5 — ABNORMAL HIGH
pCO2 arterial: 81.4
pH, Arterial: 7.412 — ABNORMAL HIGH
pH, Arterial: 7.429 — ABNORMAL HIGH
pO2, Arterial: 89

## 2011-05-06 LAB — DIFFERENTIAL
Basophils Absolute: 0
Lymphs Abs: 2
Monocytes Absolute: 0.3
Monocytes Relative: 1 — ABNORMAL LOW
Neutro Abs: 23 — ABNORMAL HIGH

## 2011-05-06 LAB — URINALYSIS, ROUTINE W REFLEX MICROSCOPIC
Bilirubin Urine: NEGATIVE
Glucose, UA: 100 — AB
Ketones, ur: NEGATIVE
Protein, ur: 100 — AB
pH: 6

## 2011-05-06 LAB — CULTURE, BLOOD (ROUTINE X 2): Culture: NO GROWTH

## 2011-05-06 LAB — THEOPHYLLINE LEVEL: Theophylline Lvl: 3 — ABNORMAL LOW

## 2011-05-06 LAB — INFLUENZA A+B VIRUS AG-DIRECT(RAPID): Influenza B Ag: NEGATIVE

## 2011-05-06 LAB — APTT: aPTT: 24

## 2011-05-06 LAB — PROTIME-INR: INR: 1

## 2011-05-06 LAB — STREP PNEUMONIAE URINARY ANTIGEN: Strep Pneumo Urinary Antigen: NEGATIVE

## 2011-05-06 LAB — CBC
HCT: 28 — ABNORMAL LOW
Hemoglobin: 9.4 — ABNORMAL LOW
Platelets: 410 — ABNORMAL HIGH
Platelets: 455 — ABNORMAL HIGH
RBC: 4.25
RDW: 14.8
WBC: 22 — ABNORMAL HIGH
WBC: 25.6 — ABNORMAL HIGH

## 2011-05-06 LAB — LEGIONELLA ANTIGEN, URINE

## 2011-05-06 LAB — B-NATRIURETIC PEPTIDE (CONVERTED LAB): Pro B Natriuretic peptide (BNP): 293 — ABNORMAL HIGH

## 2011-05-06 LAB — LACTIC ACID, PLASMA: Lactic Acid, Venous: 1

## 2011-05-07 LAB — DIFFERENTIAL
Basophils Absolute: 0
Basophils Relative: 0
Basophils Relative: 0
Basophils Relative: 0
Lymphocytes Relative: 12
Lymphocytes Relative: 15
Lymphs Abs: 1.7
Lymphs Abs: 2.6
Monocytes Absolute: 0.8
Monocytes Absolute: 0.8
Monocytes Relative: 7
Neutro Abs: 8.4 — ABNORMAL HIGH
Neutro Abs: 9.8 — ABNORMAL HIGH
Neutrophils Relative %: 80 — ABNORMAL HIGH

## 2011-05-07 LAB — IRON AND TIBC
Iron: 21 — ABNORMAL LOW
Saturation Ratios: 8 — ABNORMAL LOW
TIBC: 259

## 2011-05-07 LAB — BASIC METABOLIC PANEL
BUN: 2 — ABNORMAL LOW
BUN: 7
CO2: 27
CO2: 27
CO2: 30
CO2: 33 — ABNORMAL HIGH
CO2: 35 — ABNORMAL HIGH
Calcium: 8.1 — ABNORMAL LOW
Calcium: 8.3 — ABNORMAL LOW
Calcium: 8.7
Calcium: 9.3
Chloride: 101
Chloride: 103
Chloride: 103
Chloride: 98
Creatinine, Ser: 0.53
Creatinine, Ser: 0.59
Creatinine, Ser: 0.6
GFR calc Af Amer: >60
GFR calc Af Amer: >60
GFR calc Af Amer: >60
GFR calc Af Amer: >60
GFR calc non Af Amer: >60
GFR calc non Af Amer: >60
Glucose, Bld: 131 — ABNORMAL HIGH
Glucose, Bld: 138 — ABNORMAL HIGH
Potassium: 3 — ABNORMAL LOW
Potassium: 3.9
Sodium: 138
Sodium: 141
Sodium: 143
Sodium: 143

## 2011-05-07 LAB — COMPREHENSIVE METABOLIC PANEL
Albumin: 2.6 — ABNORMAL LOW
Alkaline Phosphatase: 71
BUN: 4 — ABNORMAL LOW
Chloride: 102
Creatinine, Ser: 0.52
Glucose, Bld: 148 — ABNORMAL HIGH
Total Bilirubin: 0.4

## 2011-05-07 LAB — CBC
HCT: 26.9 — ABNORMAL LOW
HCT: 27.3 — ABNORMAL LOW
Hemoglobin: 10.6 — ABNORMAL LOW
Hemoglobin: 9 — ABNORMAL LOW
Hemoglobin: 9 — ABNORMAL LOW
Hemoglobin: 9.6 — ABNORMAL LOW
Hemoglobin: 9.9 — ABNORMAL LOW
MCHC: 32.3
MCHC: 32.6
MCHC: 32.8
MCHC: 33.1
MCV: 86.6
MCV: 86.8
MCV: 87.6
Platelets: 408 — ABNORMAL HIGH
Platelets: 416 — ABNORMAL HIGH
RBC: 3.25 — ABNORMAL LOW
RBC: 3.42 — ABNORMAL LOW
RBC: 3.49 — ABNORMAL LOW
RBC: 3.72 — ABNORMAL LOW
RDW: 15.3
RDW: 15.5
RDW: 15.6 — ABNORMAL HIGH
RDW: 15.7 — ABNORMAL HIGH
WBC: 12.2 — ABNORMAL HIGH
WBC: 12.3 — ABNORMAL HIGH
WBC: 17.4 — ABNORMAL HIGH

## 2011-05-07 LAB — CULTURE, BLOOD (ROUTINE X 2)
Culture: NO GROWTH
Culture: NO GROWTH

## 2011-05-07 LAB — CARDIAC PANEL(CRET KIN+CKTOT+MB+TROPI)
CK, MB: 0.8
Relative Index: INVALID
Relative Index: INVALID
Total CK: 36
Troponin I: 0.02

## 2011-05-07 LAB — FOLATE: Folate: 12.5

## 2011-05-07 LAB — THEOPHYLLINE LEVEL: Theophylline Lvl: 0.2 — ABNORMAL LOW

## 2011-05-07 LAB — PROTIME-INR
INR: 1
Prothrombin Time: 13.8

## 2011-05-07 LAB — CLOSTRIDIUM DIFFICILE EIA

## 2011-05-07 LAB — CK TOTAL AND CKMB (NOT AT ARMC)
Relative Index: INVALID
Total CK: 33

## 2011-05-07 LAB — TSH: TSH: 2.183

## 2011-05-07 LAB — APTT: aPTT: <20 — ABNORMAL LOW

## 2011-05-07 LAB — OCCULT BLOOD X 1 CARD TO LAB, STOOL: Fecal Occult Bld: NEGATIVE

## 2011-05-11 LAB — COMPREHENSIVE METABOLIC PANEL
ALT: 16
ALT: 17
AST: 12
AST: 12
AST: 19
Albumin: 3.1 — ABNORMAL LOW
Alkaline Phosphatase: 73
Alkaline Phosphatase: 70
Alkaline Phosphatase: 79
BUN: 10
CO2: 26
CO2: 29
CO2: 24
Calcium: 9.8
Calcium: 9
Chloride: 101
Chloride: 105
Chloride: 103
Creatinine, Ser: 0.49
Creatinine, Ser: 0.52
GFR calc Af Amer: >60
GFR calc Af Amer: >60
GFR calc Af Amer: >60
GFR calc non Af Amer: >60
GFR calc non Af Amer: >60
GFR calc non Af Amer: >60
Glucose, Bld: 121 — ABNORMAL HIGH
Glucose, Bld: 110 — ABNORMAL HIGH
Potassium: 2.9 — ABNORMAL LOW
Potassium: 3.4 — ABNORMAL LOW
Potassium: 3.6
Sodium: 140
Sodium: 141
Total Bilirubin: 0.5
Total Bilirubin: 0.9
Total Bilirubin: 0.8
Total Protein: 5.9 — ABNORMAL LOW

## 2011-05-11 LAB — DIFFERENTIAL
Basophils Absolute: 0
Basophils Absolute: 0
Eosinophils Absolute: 0
Eosinophils Absolute: 0
Eosinophils Relative: 0
Eosinophils Relative: 0
Lymphocytes Relative: 11 — ABNORMAL LOW
Lymphs Abs: 1.6
Monocytes Absolute: 0.7
Neutrophils Relative %: 85 — ABNORMAL HIGH

## 2011-05-11 LAB — GLUCOSE, CAPILLARY
Glucose-Capillary: 100 — ABNORMAL HIGH
Glucose-Capillary: 106 — ABNORMAL HIGH
Glucose-Capillary: 95
Glucose-Capillary: 96
Glucose-Capillary: 96

## 2011-05-11 LAB — URINALYSIS, ROUTINE W REFLEX MICROSCOPIC
Glucose, UA: NEGATIVE
Hgb urine dipstick: NEGATIVE
Hgb urine dipstick: NEGATIVE
Nitrite: NEGATIVE
Specific Gravity, Urine: 1.029
Specific Gravity, Urine: 1.029
Urobilinogen, UA: 0.2
Urobilinogen, UA: 1
pH: 6

## 2011-05-11 LAB — CBC
HCT: 33.5 — ABNORMAL LOW
HCT: 34.4 — ABNORMAL LOW
HCT: 36.6
HCT: 39.3
Hemoglobin: 12
Hemoglobin: 11.4 — ABNORMAL LOW
MCHC: 32.8
MCV: 87.7
MCV: 88.5
MCV: 87.4
Platelets: 347
Platelets: 349
Platelets: 437 — ABNORMAL HIGH
RBC: 3.78 — ABNORMAL LOW
RBC: 4.17
RBC: 3.82 — ABNORMAL LOW
RBC: 4.72
RDW: 15.6 — ABNORMAL HIGH
RDW: 15.8 — ABNORMAL HIGH
WBC: 11.3 — ABNORMAL HIGH
WBC: 14.8 — ABNORMAL HIGH
WBC: 13.8 — ABNORMAL HIGH
WBC: 16.8 — ABNORMAL HIGH

## 2011-05-11 LAB — TROPONIN I: Troponin I: 0.02

## 2011-05-11 LAB — BASIC METABOLIC PANEL
BUN: 11
BUN: 14
Calcium: 8.7
Creatinine, Ser: 0.51
GFR calc non Af Amer: >60
GFR calc non Af Amer: >60
Glucose, Bld: 124 — ABNORMAL HIGH
Potassium: 4
Sodium: 134 — ABNORMAL LOW

## 2011-05-11 LAB — LIPID PANEL
Cholesterol: 200
LDL Cholesterol: 122 — ABNORMAL HIGH
Total CHOL/HDL Ratio: 4.7

## 2011-05-11 LAB — PROTIME-INR: Prothrombin Time: 13.2

## 2011-05-11 LAB — TSH: TSH: 2.913

## 2011-05-11 LAB — LIPASE, BLOOD: Lipase: 12

## 2011-05-11 LAB — HEMOGLOBIN A1C
Hgb A1c MFr Bld: 7 — ABNORMAL HIGH
Mean Plasma Glucose: 154

## 2011-05-11 LAB — B-NATRIURETIC PEPTIDE (CONVERTED LAB)
Pro B Natriuretic peptide (BNP): 147 — ABNORMAL HIGH
Pro B Natriuretic peptide (BNP): 194 — ABNORMAL HIGH

## 2011-05-12 LAB — URINALYSIS, ROUTINE W REFLEX MICROSCOPIC
Glucose, UA: NEGATIVE
Hgb urine dipstick: NEGATIVE
Ketones, ur: 15 — AB
Protein, ur: NEGATIVE

## 2011-05-12 LAB — CBC
HCT: 39.6
Hemoglobin: 13
MCHC: 32.7
MCV: 90.3
RBC: 4.39
WBC: 14.1 — ABNORMAL HIGH

## 2011-05-12 LAB — HEPATIC FUNCTION PANEL
Albumin: 3.4 — ABNORMAL LOW
Alkaline Phosphatase: 70
Bilirubin, Direct: 0.1
Total Bilirubin: 0.7

## 2011-05-12 LAB — LIPASE, BLOOD: Lipase: 12

## 2011-05-12 LAB — POCT I-STAT, CHEM 8
BUN: 12
Calcium, Ion: 1.02 — ABNORMAL LOW
Creatinine, Ser: 0.5
Hemoglobin: 12.9
TCO2: 25

## 2011-05-12 LAB — DIFFERENTIAL
Eosinophils Absolute: 0
Lymphs Abs: 2
Monocytes Absolute: 0.8
Monocytes Relative: 6
Neutrophils Relative %: 80 — ABNORMAL HIGH

## 2011-06-01 ENCOUNTER — Inpatient Hospital Stay (HOSPITAL_COMMUNITY)
Admission: EM | Admit: 2011-06-01 | Discharge: 2011-06-05 | DRG: 690 | Disposition: A | Discharge status: Skilled Nursing Facility | Payer: Medicare Other | Attending: Internal Medicine | Admitting: Internal Medicine

## 2011-06-01 ENCOUNTER — Emergency Department (HOSPITAL_COMMUNITY): Payer: Medicare Other

## 2011-06-01 DIAGNOSIS — J449 Chronic obstructive pulmonary disease, unspecified: Secondary | ICD-10-CM | POA: Diagnosis present

## 2011-06-01 DIAGNOSIS — L02219 Cutaneous abscess of trunk, unspecified: Secondary | ICD-10-CM | POA: Diagnosis present

## 2011-06-01 DIAGNOSIS — N390 Urinary tract infection, site not specified: Principal | ICD-10-CM | POA: Diagnosis present

## 2011-06-01 DIAGNOSIS — Z87311 Personal history of (healed) other pathological fracture: Secondary | ICD-10-CM

## 2011-06-01 DIAGNOSIS — J4489 Other specified chronic obstructive pulmonary disease: Secondary | ICD-10-CM | POA: Diagnosis present

## 2011-06-01 DIAGNOSIS — I252 Old myocardial infarction: Secondary | ICD-10-CM

## 2011-06-01 DIAGNOSIS — M810 Age-related osteoporosis without current pathological fracture: Secondary | ICD-10-CM | POA: Diagnosis present

## 2011-06-01 DIAGNOSIS — A498 Other bacterial infections of unspecified site: Secondary | ICD-10-CM | POA: Diagnosis present

## 2011-06-01 DIAGNOSIS — I10 Essential (primary) hypertension: Secondary | ICD-10-CM | POA: Diagnosis present

## 2011-06-01 DIAGNOSIS — R51 Headache: Secondary | ICD-10-CM | POA: Diagnosis present

## 2011-06-01 DIAGNOSIS — E876 Hypokalemia: Secondary | ICD-10-CM | POA: Diagnosis present

## 2011-06-01 DIAGNOSIS — M5137 Other intervertebral disc degeneration, lumbosacral region: Secondary | ICD-10-CM | POA: Diagnosis present

## 2011-06-01 DIAGNOSIS — M51379 Other intervertebral disc degeneration, lumbosacral region without mention of lumbar back pain or lower extremity pain: Secondary | ICD-10-CM | POA: Diagnosis present

## 2011-06-01 DIAGNOSIS — I69998 Other sequelae following unspecified cerebrovascular disease: Secondary | ICD-10-CM

## 2011-06-01 DIAGNOSIS — E119 Type 2 diabetes mellitus without complications: Secondary | ICD-10-CM | POA: Diagnosis present

## 2011-06-01 DIAGNOSIS — R0789 Other chest pain: Secondary | ICD-10-CM | POA: Diagnosis present

## 2011-06-01 DIAGNOSIS — R5381 Other malaise: Secondary | ICD-10-CM | POA: Diagnosis present

## 2011-06-01 DIAGNOSIS — F17200 Nicotine dependence, unspecified, uncomplicated: Secondary | ICD-10-CM | POA: Diagnosis present

## 2011-06-01 LAB — COMPREHENSIVE METABOLIC PANEL
AST: 13 U/L (ref 0–37)
CO2: 28 mEq/L (ref 19–32)
Chloride: 99 mEq/L (ref 96–112)
Creatinine, Ser: 0.45 mg/dL — ABNORMAL LOW (ref 0.50–1.10)
GFR calc non Af Amer: >90 mL/min (ref >90)
Total Bilirubin: 0.3 mg/dL (ref 0.3–1.2)

## 2011-06-01 LAB — CBC
HCT: 41.9 % (ref 36.0–46.0)
Hemoglobin: 13.7 g/dL (ref 12.0–15.0)
MCV: 88.2 fL (ref 78.0–100.0)
Platelets: 400 10*3/uL (ref 150–400)
RBC: 4.75 MIL/uL (ref 3.87–5.11)
WBC: 15 10*3/uL — ABNORMAL HIGH (ref 4.0–10.5)

## 2011-06-01 LAB — URINALYSIS, ROUTINE W REFLEX MICROSCOPIC
Bilirubin Urine: NEGATIVE
Glucose, UA: NEGATIVE mg/dL
Ketones, ur: NEGATIVE mg/dL
pH: 7 (ref 5.0–8.0)

## 2011-06-01 LAB — URINE MICROSCOPIC-ADD ON

## 2011-06-01 LAB — MAGNESIUM: Magnesium: 1.6 mg/dL (ref 1.5–2.5)

## 2011-06-01 LAB — DIFFERENTIAL
Lymphocytes Relative: 16 % (ref 12–46)
Lymphs Abs: 2.4 10*3/uL (ref 0.7–4.0)
Neutro Abs: 11.2 10*3/uL — ABNORMAL HIGH (ref 1.7–7.7)
Neutrophils Relative %: 75 % (ref 43–77)

## 2011-06-02 ENCOUNTER — Inpatient Hospital Stay (HOSPITAL_COMMUNITY): Payer: Medicare Other

## 2011-06-02 LAB — BASIC METABOLIC PANEL
BUN: 11 mg/dL (ref 6–23)
Calcium: 8 mg/dL — ABNORMAL LOW (ref 8.4–10.5)
Creatinine, Ser: 0.43 mg/dL — ABNORMAL LOW (ref 0.50–1.10)
GFR calc non Af Amer: >90 mL/min (ref >90)
Glucose, Bld: 134 mg/dL — ABNORMAL HIGH (ref 70–99)
Sodium: 139 mEq/L (ref 135–145)

## 2011-06-02 LAB — CBC
HCT: 33.6 % — ABNORMAL LOW (ref 36.0–46.0)
Hemoglobin: 10.6 g/dL — ABNORMAL LOW (ref 12.0–15.0)
MCH: 28.6 pg (ref 26.0–34.0)
MCHC: 31.5 g/dL (ref 30.0–36.0)

## 2011-06-02 NOTE — H&P (Signed)
[name] [name], MANGAL NO.:  [id number]  MEDICAL RECORD NO.:  [id number]  LOCATION:  1313                         FACILITY:  [facility]  PHYSICIAN:  Gery Pray, MD      DATE OF BIRTH:  11-16-1940  DATE OF ADMISSION:  06/01/2011 DATE OF DISCHARGE:                             HISTORY & PHYSICAL   PRIMARY CARE PHYSICIAN:  Pramod P. Pearlean Brownie, MD  CODE STATUS:  Full code.  The patient goes to team 1.  CHIEF COMPLAINT:  Abdominal pain.  HISTORY OF PRESENT ILLNESS:  This is a 70 year old female who had a recent CVA, was in rehab and just discharged home a few weeks ago.  She was quite debilitated prior to her CVA, no significant decline  since her CVA. She is mostly wheelchair bound.  Approximately 3 days ago, she started having abdominal pain that was more bandlike across the abdomen, radiating to the back.  She reports that she had some nausea, but no vomiting, no diarrhea.  She states she has no constipation, no fevers, though she has had some chills.  The pain is described as cramping and intermittent.  She was quite apprehensive because she lives alone.  She additionally states that she has also been falling.  She fell approximately 10 days ago and she fell 2 days ago.  She had to call the fire squad to get her up off the floor. She does have assistance at home, Mondays, Tuesdays, Wednesdays, and Saturdays.  She was to have had night assistance, however, that part fell through.  She came to the ER simply out of fear and apprehension.  The patient's son is present at bedside.  He states the patient smokes and she insisted on going home from rehab, he believes because she was not allowed to smoke at the rehab.  They have been trying to get assistance in placement for her, however, they have had difficulty doing so because of financial reasons. The patient lives in a home where her wheelchair does not get through certain doors, for example the bathroom door, so  she has to get out of the wheelchair and make it to the bathroom on her cane.  REVIEW OF SYSTEMS:  All 10-point systems reviewed are negative except as noted in HPI.  PAST MEDICAL HISTORY: 1. COPD. 2. Hypertension. 3. Chronic back pain. 4. Diabetes mellitus. 5. Coronary artery disease with history of an MI. 6. Recent CVA with additional residual weakness. 7. Vertebral compression fractures. 8. Osteoporosis.  PAST SURGICAL HISTORY:  Vertebroplasty, hysterectomy, and cholecystectomy.  MEDICATIONS:  Patient lists only Advil, alprazolam, ProAir, and trazodone.  Additional medications were listed on her recent discharge summary prior to rehab, which includes aspirin and Crestor and Advair.  ALLERGIES:  The patient is allergic to, 1. SULFA. 2. CODEINE.  SOCIAL HISTORY:  Positive tobacco.  Negative for alcohol, illicit drugs. The patient lives alone.  She mostly uses a wheelchair, however, she also uses a cane when needed.  She does have a home assistant Monday, Tuesday, Wednesday, and Saturday.  FAMILY HISTORY:  Significant for diabetes mellitus.  PHYSICAL EXAMINATION:  VITAL SIGNS:  Blood pressure 161/68 initially, currently 131/66, pulse 89, respirations  24, temperature 97.6, and saturating 95% on room air. GENERAL:  Alert and oriented female. EYES:  Pink conjunctivae.  PERRLA. ENT:  Moist oral mucosa.  Trachea midline. NECK:  Supple. LUNGS:  No use of accessory muscles.  The patient does have some occasional scattered rhonchi, however, very good air exchange. CARDIOVASCULAR:  Regular rate and rhythm without murmurs, regurg, or gallops.  No JVD. ABDOMEN:  Has some nonspecific tenderness to palpation.  No rebound.  No guarding.  Not acute surgical abdomen.  Unable to assess organomegaly. NEURO:  Cranial nerves II through XII appear to be grossly intact. Sensation intact. MUSCULOSKELETAL:  Strength in bilateral lower extremities decreased, approximately 4/5 bilateral lower  extremities.  No edema. SKIN:  The patient does have evidence of cellulitis in the right groin area, otherwise normal.  No subcutaneous crepitation.  No decubitus flaps.  LABORATORY DATA:  Sodium 140, potassium 2.6, chloride 99, CO2 of 28, glucose 119, BUN 12, creatinine 0.45.  LFTs are normal.  CT abdomen and pelvis is essentially normal.  Lipase is 8.  White blood count 15, hemoglobin 13, platelets 400.  UA, leukocyte esterase large, nitrites negative, bacteria many, white blood cells 7-10.  Magnesium 1.6.  EKG, normal sinus rhythm with no ST-segment changes.  ASSESSMENT AND PLAN: 1. Hypokalemia. 2. Sepsis due to urinary tract infection.  The patient will be     admitted.  We will replete patient's potassium.  We will start the     patient on IV antibiotics for the patient's urinary tract     infection.  Patient's abdominal pain may be secondary to urinary     tract infection, possible pyelonephritis.     P.r.n. pain medication.  We will monitor to     see if abdominal issues improve with IV antibiotics. 3. Cellulitis, right groin, likely secondary to constant irritation     from patient's damp diapers.  Nystatin cream has been ordered.  IV     antibiotic Rocephin is also on board for treatment of patient's     UTI 4. Tobacco abuse.  Nicotine patch has been ordered as well as     nebulizers, continue Advair. 5. Recent cerebrovascular accident.  We will resume patient's aspirin     daily as well as her Crestor. 6. Deconditioned/social issues/home safety.  We will ask social worker     to assist family with additional options.     It has been discussed in detail with patient's son who is at the     bedside that patient's home situation is currently unsafe and that     they do need to make all efforts to find urgent and safe home     placement for mom, being home with family is a viable option.          ______________________________ Gery Pray, MD     DC/MEDQ  D:   06/01/2011  T:  06/01/2011  Job:  [id number]  Electronically Signed by Gery Pray MD on 06/02/2011 03:45:36 AM

## 2011-06-03 ENCOUNTER — Inpatient Hospital Stay (HOSPITAL_COMMUNITY): Payer: Medicare Other

## 2011-06-03 LAB — CARDIAC PANEL(CRET KIN+CKTOT+MB+TROPI)
Relative Index: INVALID (ref 0.0–2.5)
Relative Index: INVALID (ref 0.0–2.5)
Total CK: 36 U/L (ref 7–177)
Troponin I: <0.3 ng/mL (ref <0.30)
Troponin I: <0.3 ng/mL (ref <0.30)

## 2011-06-03 LAB — BASIC METABOLIC PANEL
CO2: 26 mEq/L (ref 19–32)
Calcium: 8.7 mg/dL (ref 8.4–10.5)
GFR calc Af Amer: >90 mL/min (ref >90)
GFR calc non Af Amer: >90 mL/min (ref >90)
Sodium: 138 mEq/L (ref 135–145)

## 2011-06-03 LAB — IRON AND TIBC: UIBC: 253 ug/dL (ref 125–400)

## 2011-06-03 LAB — GLUCOSE, CAPILLARY: Glucose-Capillary: 139 mg/dL — ABNORMAL HIGH (ref 70–99)

## 2011-06-03 LAB — FERRITIN: Ferritin: 88 ng/mL (ref 10–291)

## 2011-06-03 LAB — FOLATE: Folate: 7.2 ng/mL

## 2011-06-04 LAB — BASIC METABOLIC PANEL
CO2: 28 mEq/L (ref 19–32)
Glucose, Bld: 121 mg/dL — ABNORMAL HIGH (ref 70–99)
Potassium: 3.9 mEq/L (ref 3.5–5.1)
Sodium: 140 mEq/L (ref 135–145)

## 2011-06-04 LAB — CBC
Hemoglobin: 11.5 g/dL — ABNORMAL LOW (ref 12.0–15.0)
Platelets: 319 10*3/uL (ref 150–400)
RBC: 4.05 MIL/uL (ref 3.87–5.11)
WBC: 10.1 10*3/uL (ref 4.0–10.5)

## 2011-06-04 LAB — GLUCOSE, CAPILLARY
Glucose-Capillary: 132 mg/dL — ABNORMAL HIGH (ref 70–99)
Glucose-Capillary: 97 mg/dL (ref 70–99)
Glucose-Capillary: 143 mg/dL — ABNORMAL HIGH (ref 70–99)
Glucose-Capillary: 134 mg/dL — ABNORMAL HIGH (ref 70–99)

## 2011-06-04 LAB — URINE CULTURE: Colony Count: >=100000

## 2011-06-05 ENCOUNTER — Inpatient Hospital Stay (HOSPITAL_COMMUNITY): Payer: Medicare Other

## 2011-06-05 LAB — CBC
MCH: 28.8 pg (ref 26.0–34.0)
RBC: 3.85 MIL/uL — ABNORMAL LOW (ref 3.87–5.11)
RDW: 13.7 % (ref 11.5–15.5)
WBC: 13.3 10*3/uL — ABNORMAL HIGH (ref 4.0–10.5)

## 2011-06-05 LAB — GLUCOSE, CAPILLARY
Glucose-Capillary: 113 mg/dL — ABNORMAL HIGH (ref 70–99)
Glucose-Capillary: 99 mg/dL (ref 70–99)

## 2011-06-07 ENCOUNTER — Emergency Department (HOSPITAL_COMMUNITY): Payer: Medicare Other

## 2011-06-07 ENCOUNTER — Emergency Department (HOSPITAL_COMMUNITY)
Admission: EM | Admit: 2011-06-07 | Discharge: 2011-06-07 | Disposition: A | Discharge status: Home / Self Care | Payer: Medicare Other | Attending: Emergency Medicine | Admitting: Emergency Medicine

## 2011-06-07 DIAGNOSIS — Y998 Other external cause status: Secondary | ICD-10-CM | POA: Insufficient documentation

## 2011-06-07 DIAGNOSIS — E109 Type 1 diabetes mellitus without complications: Secondary | ICD-10-CM | POA: Insufficient documentation

## 2011-06-07 DIAGNOSIS — S0990XA Unspecified injury of head, initial encounter: Secondary | ICD-10-CM | POA: Insufficient documentation

## 2011-06-07 DIAGNOSIS — J45909 Unspecified asthma, uncomplicated: Secondary | ICD-10-CM | POA: Insufficient documentation

## 2011-06-07 DIAGNOSIS — Z79899 Other long term (current) drug therapy: Secondary | ICD-10-CM | POA: Insufficient documentation

## 2011-06-07 DIAGNOSIS — W06XXXA Fall from bed, initial encounter: Secondary | ICD-10-CM | POA: Insufficient documentation

## 2011-06-07 DIAGNOSIS — Z7982 Long term (current) use of aspirin: Secondary | ICD-10-CM | POA: Insufficient documentation

## 2011-06-07 DIAGNOSIS — I10 Essential (primary) hypertension: Secondary | ICD-10-CM | POA: Insufficient documentation

## 2011-06-07 DIAGNOSIS — I252 Old myocardial infarction: Secondary | ICD-10-CM | POA: Insufficient documentation

## 2011-06-07 DIAGNOSIS — K219 Gastro-esophageal reflux disease without esophagitis: Secondary | ICD-10-CM | POA: Insufficient documentation

## 2011-06-07 DIAGNOSIS — Z8673 Personal history of transient ischemic attack (TIA), and cerebral infarction without residual deficits: Secondary | ICD-10-CM | POA: Insufficient documentation

## 2011-06-07 DIAGNOSIS — Y9210 Unspecified residential institution as the place of occurrence of the external cause: Secondary | ICD-10-CM | POA: Insufficient documentation

## 2011-06-07 DIAGNOSIS — S0100XA Unspecified open wound of scalp, initial encounter: Secondary | ICD-10-CM | POA: Insufficient documentation

## 2011-06-07 LAB — URINALYSIS, ROUTINE W REFLEX MICROSCOPIC
Ketones, ur: 40 mg/dL — AB
Leukocytes, UA: NEGATIVE
Nitrite: NEGATIVE
Protein, ur: NEGATIVE mg/dL
Urobilinogen, UA: 0.2 mg/dL (ref 0.0–1.0)

## 2011-06-07 LAB — CBC
MCH: 28.7 pg (ref 26.0–34.0)
MCHC: 31.8 g/dL (ref 30.0–36.0)
MCV: 90.2 fL (ref 78.0–100.0)
Platelets: 374 10*3/uL (ref 150–400)
RBC: 4.5 MIL/uL (ref 3.87–5.11)
RDW: 13.7 % (ref 11.5–15.5)

## 2011-06-07 LAB — DIFFERENTIAL
Basophils Relative: 1 % (ref 0–1)
Eosinophils Absolute: 0.4 10*3/uL (ref 0.0–0.7)
Eosinophils Relative: 3 % (ref 0–5)
Monocytes Relative: 10 % (ref 3–12)
Neutrophils Relative %: 78 % — ABNORMAL HIGH (ref 43–77)

## 2011-06-07 LAB — BASIC METABOLIC PANEL
CO2: 26 mEq/L (ref 19–32)
Calcium: 9.7 mg/dL (ref 8.4–10.5)
Creatinine, Ser: 0.41 mg/dL — ABNORMAL LOW (ref 0.50–1.10)
GFR calc Af Amer: >90 mL/min (ref >90)
GFR calc non Af Amer: >90 mL/min (ref >90)
Sodium: 133 mEq/L — ABNORMAL LOW (ref 135–145)

## 2011-06-07 LAB — GLUCOSE, CAPILLARY: Glucose-Capillary: 156 mg/dL — ABNORMAL HIGH (ref 70–99)

## 2011-06-08 LAB — CULTURE, BLOOD (ROUTINE X 2)
Culture  Setup Time: 201210230526
Culture: NO GROWTH

## 2011-06-09 ENCOUNTER — Emergency Department (HOSPITAL_COMMUNITY): Payer: Medicare Other

## 2011-06-09 ENCOUNTER — Emergency Department (HOSPITAL_COMMUNITY)
Admission: EM | Admit: 2011-06-09 | Discharge: 2011-06-10 | Disposition: A | Discharge status: Home / Self Care | Payer: Medicare Other | Attending: Emergency Medicine | Admitting: Emergency Medicine

## 2011-06-09 DIAGNOSIS — I10 Essential (primary) hypertension: Secondary | ICD-10-CM | POA: Insufficient documentation

## 2011-06-09 DIAGNOSIS — T1490XA Injury, unspecified, initial encounter: Secondary | ICD-10-CM | POA: Insufficient documentation

## 2011-06-09 DIAGNOSIS — M5030 Other cervical disc degeneration, unspecified cervical region: Secondary | ICD-10-CM | POA: Insufficient documentation

## 2011-06-09 DIAGNOSIS — W1809XA Striking against other object with subsequent fall, initial encounter: Secondary | ICD-10-CM | POA: Insufficient documentation

## 2011-06-09 DIAGNOSIS — E109 Type 1 diabetes mellitus without complications: Secondary | ICD-10-CM | POA: Insufficient documentation

## 2011-06-09 DIAGNOSIS — S0100XA Unspecified open wound of scalp, initial encounter: Secondary | ICD-10-CM | POA: Insufficient documentation

## 2011-06-09 DIAGNOSIS — I252 Old myocardial infarction: Secondary | ICD-10-CM | POA: Insufficient documentation

## 2011-06-09 DIAGNOSIS — G319 Degenerative disease of nervous system, unspecified: Secondary | ICD-10-CM | POA: Insufficient documentation

## 2011-06-09 DIAGNOSIS — Y9210 Unspecified residential institution as the place of occurrence of the external cause: Secondary | ICD-10-CM | POA: Insufficient documentation

## 2011-06-10 NOTE — Discharge Summary (Signed)
[name] [name], GEESLIN NO.:  [id number]  MEDICAL RECORD NO.:  [id number]  LOCATION:  1437                         FACILITY:  [facility]  PHYSICIAN:  Hartley Barefoot, MD    DATE OF BIRTH:  March 08, 1941  DATE OF ADMISSION:  06/01/2011 DATE OF DISCHARGE:                        DISCHARGE SUMMARY - REFERRING   DISCHARGE DATE:  To be determined.  DISCHARGE DIAGNOSES: 1. Urinary tract infection, Escherichia coli, pan resistant. 2. Deconditioning. 3. Lower back pain secondary to degenerative disk disease. 4. History of cerebrovascular accident. 5. Right groin cellulitis. 6. Past medical history of chronic obstructive pulmonary disease. 7. Hypertension. 8. Chronic back pain. 9. Diabetes. 10.Coronary artery disease. 11.History of vertebral compression fracture. 12.Osteoporosis.  DISCHARGE MEDICATIONS: 1. Fluconazole 100 mg p.o. daily. 2. Methocarbamol 500 mg p.o. every 8 hours as needed. 3. Nitrofurantoin 100 mg p.o. b.i.d. 4. Nystatin one application twice daily. 5. Protonix 40 mg p.o. b.i.d. 6. Crestor 10 mg p.o. daily. 7. Gabapentin 300 mg 1 tablet every 8 hours. 8. Tramadol 50 mg 1 tablet by mouth twice daily as needed. 9. Xanax 0.5 half a tablet by mouth twice daily as needed. 10.Tylenol 325 two tablets every 8 hours as needed. 11.Aspirin 81 mg p.o. daily. 12.Advair 115/21 two puffs inhaled twice daily.  DISPOSITION AND FOLLOWUP:  Ms. Reade will need to work with physical therapy.  At this time, we are trying to see if she is going to be able to go to assisted living facility or home health with PT.  She will need to work with PT for postural training and core training exercise as the patient tolerated.  CONSULTANT:  Orthopedics, Dr. Kevan Ny.  RADIOGRAPHIC STUDIES: 1. MRI of the thoracic and lumbar spine show cervical spondylotic     changes are noted, but incompletely evaluated.  Remote T7-T9     compression fracture treated with cement.  Remote mild  superior     endplate compression fracture T5 and minimal superior endplate     compression fracture T6.  No new compression fracture noted.  Right     T5 vertebral body hemangioma.  Left T3 vertebral body bony lesion     extending to the left T3 pedicle possible minimally more prominent     on the order prior exam.  No exact etiology is indeterminate.     Attention to these on follow up.  No other worrisome bony lesion     noted.  Scattered mild thoracic spine degenerative changes.     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  Lumbar spine, no acute     compression fracture,  degenerative changes relatively stable since     prior exam with findings most prominent at L3-4 level as detailed     above. 2. Chest x-ray, no acute cardiopulmonary disease. 3. CT abdomen and pelvis, no evidence of traumatic injury to the     abdomen or pelvis.  No pneumoperitoneal free air.  No evidence of     bowel obstruction.  Diverticulosis, no diverticulitis.     Additionally stable ancillary finding as above. 4. CT head pending at this time.  BRIEF HISTORY OF PRESENT ILLNESS:  This is 70 year old who had a recent  CVA, was in rehab and was discharged home a few weeks ago.  She was quite debilitated prior to the CVA.  No significant decline since her CVA.  She is mostly wheelchair bound.  Approximately 3 days ago, she started having abdominal pain that was more of bandlike across the abdomen radiating to the back.  She reports that she has some nausea, but no vomiting.  She has been falling recently.  She was also complaining of lower back pain.  HOSPITAL COURSE: 1. Urinary tract infection.  Her abdominal pain was likely secondary     to UTI.  She had a UA that grew 100,000 colonies.  E. coli is pan     resistant.  Sensitive to nitrofurantoin.  She will be started on     nitrofurantoin to complete a total of 7 days. 2. Lower back pain.  She had an MRI, which results as above.  Ortho     was  consulted.  They recommended conservative management.  Pain     medication and exercise with physical therapy.  They recommended     prednisone.  The patient refused prednisone.  I will give her a     trial of ibuprofen for 3 days.  Pain medication as needed.  The     patient will need to be very cautious with Robaxin and all other     sedative medications. 3. History of cerebrovascular accident.  We will continue with     aspirin. 4. Headache.  The patient is complaining of headache.  We will get a     CT head and neuro exam grossly unchanged.  DISPOSITION:  Pending at this time due to social issues.     Hartley Barefoot, MD     BR/MEDQ  D:  06/05/2011  T:  06/05/2011  Job:  [id number]  Electronically Signed by Hartley Barefoot MD on 06/10/2011 08:48:58 PM

## 2011-08-19 ENCOUNTER — Ambulatory Visit: Payer: Self-pay | Attending: Internal Medicine | Admitting: Rehabilitative and Restorative Service Providers"

## 2011-08-19 DIAGNOSIS — R269 Unspecified abnormalities of gait and mobility: Secondary | ICD-10-CM | POA: Insufficient documentation

## 2011-08-19 DIAGNOSIS — H8110 Benign paroxysmal vertigo, unspecified ear: Secondary | ICD-10-CM | POA: Insufficient documentation

## 2011-08-19 DIAGNOSIS — IMO0001 Reserved for inherently not codable concepts without codable children: Secondary | ICD-10-CM | POA: Insufficient documentation

## 2011-09-02 ENCOUNTER — Ambulatory Visit: Payer: Self-pay | Admitting: Rehabilitative and Restorative Service Providers"

## 2012-09-23 ENCOUNTER — Emergency Department (HOSPITAL_COMMUNITY)
Admission: EM | Admit: 2012-09-23 | Discharge: 2012-09-23 | Disposition: A | Discharge status: Skilled Nursing Facility | Payer: Medicare Other | Attending: Emergency Medicine | Admitting: Emergency Medicine

## 2012-09-23 ENCOUNTER — Encounter (HOSPITAL_COMMUNITY): Payer: Self-pay | Admitting: Emergency Medicine

## 2012-09-23 DIAGNOSIS — F411 Generalized anxiety disorder: Secondary | ICD-10-CM | POA: Insufficient documentation

## 2012-09-23 DIAGNOSIS — Y9210 Unspecified residential institution as the place of occurrence of the external cause: Secondary | ICD-10-CM | POA: Insufficient documentation

## 2012-09-23 DIAGNOSIS — Z8739 Personal history of other diseases of the musculoskeletal system and connective tissue: Secondary | ICD-10-CM | POA: Insufficient documentation

## 2012-09-23 DIAGNOSIS — Z8742 Personal history of other diseases of the female genital tract: Secondary | ICD-10-CM | POA: Insufficient documentation

## 2012-09-23 DIAGNOSIS — F3289 Other specified depressive episodes: Secondary | ICD-10-CM | POA: Insufficient documentation

## 2012-09-23 DIAGNOSIS — Y9389 Activity, other specified: Secondary | ICD-10-CM | POA: Insufficient documentation

## 2012-09-23 DIAGNOSIS — IMO0002 Reserved for concepts with insufficient information to code with codable children: Secondary | ICD-10-CM | POA: Insufficient documentation

## 2012-09-23 DIAGNOSIS — W19XXXA Unspecified fall, initial encounter: Secondary | ICD-10-CM | POA: Insufficient documentation

## 2012-09-23 DIAGNOSIS — Z79899 Other long term (current) drug therapy: Secondary | ICD-10-CM | POA: Insufficient documentation

## 2012-09-23 DIAGNOSIS — K219 Gastro-esophageal reflux disease without esophagitis: Secondary | ICD-10-CM | POA: Insufficient documentation

## 2012-09-23 DIAGNOSIS — F329 Major depressive disorder, single episode, unspecified: Secondary | ICD-10-CM | POA: Insufficient documentation

## 2012-09-23 DIAGNOSIS — J449 Chronic obstructive pulmonary disease, unspecified: Secondary | ICD-10-CM | POA: Insufficient documentation

## 2012-09-23 DIAGNOSIS — S92309A Fracture of unspecified metatarsal bone(s), unspecified foot, initial encounter for closed fracture: Secondary | ICD-10-CM | POA: Insufficient documentation

## 2012-09-23 DIAGNOSIS — J4489 Other specified chronic obstructive pulmonary disease: Secondary | ICD-10-CM | POA: Insufficient documentation

## 2012-09-23 HISTORY — DX: Low back pain: M54.5

## 2012-09-23 HISTORY — DX: Spinal stenosis, site unspecified: M48.00

## 2012-09-23 HISTORY — DX: Gastro-esophageal reflux disease without esophagitis: K21.9

## 2012-09-23 HISTORY — DX: Chronic obstructive pulmonary disease, unspecified: J44.9

## 2012-09-23 HISTORY — DX: Low back pain, unspecified: M54.50

## 2012-09-23 HISTORY — DX: Unspecified abnormalities of gait and mobility: R26.9

## 2012-09-23 HISTORY — DX: Other specified chronic obstructive pulmonary disease: J44.89

## 2012-09-23 HISTORY — DX: Other specified depressive episodes: F32.89

## 2012-09-23 HISTORY — DX: Other urogenital candidiasis: B37.49

## 2012-09-23 HISTORY — DX: Major depressive disorder, single episode, unspecified: F32.9

## 2012-09-23 HISTORY — DX: Generalized anxiety disorder: F41.1

## 2012-09-23 MED ORDER — OXYCODONE-ACETAMINOPHEN 5-325 MG PO TABS: 1.0000 | ORAL_TABLET | Freq: Once | ORAL | Status: DC

## 2012-09-23 MED ORDER — OXYCODONE-ACETAMINOPHEN 5-325 MG PO TABS
1.0000 | ORAL_TABLET | Freq: Once | ORAL | Status: AC
  Administered 2012-09-23: 1 via ORAL
  Filled 2012-09-23: qty 1

## 2012-09-23 NOTE — Progress Notes (Signed)
CSW met with pt at bedside. Pt shared she is a resident at [facility].  Pt requested assistance trying to get in touch with patient son. CSW called [facility] for assistance with patient son's numbers and friends number in patient room. The numbers [facility] had are nonworking numbers, or not accepting incoming calls. [phone]. Patient friends number is, Harriett Sine: [phone]. CSW assisted patient with calling patient friend as she requested however provider came to evaluate and will assist patient with calling friend when finished.   Per discussion with pt nurse at [facility], patient had a fall on Tuesday night, but did not have swelling until Wednesday. Patient declined going to the hospital, however due to continued swelling patient foot was xrayed on Thursday when the xray team could come to the facility, and fracture was identified on Thursday.   Pt plans to return to [facility] when medically stable.   Catha Gosselin, LCSWA  [phone] .09/23/2012 1845pm

## 2012-09-23 NOTE — ED Notes (Signed)
[id number]<AV> Expected date:<BR> Expected time:<BR> Means of arrival:<BR> Comments:<BR> 71yo Broken toes

## 2012-09-23 NOTE — ED Notes (Signed)
Per EMS: Pt is from [facility]. Pt was walking on Tuesday and fell, twisted her left foot, per facility - pt did not want it evaluated. X-ray was taken today which showed: Transverse minimally displaced fractures involving the distal second, third, forth, fifth metatarsals. Pt reports 9/10 pain, facility gave Vicodin and xanax at 1700, which has not eased pain. Mild swelling in left foot, toes are noted to have bruising.

## 2012-09-23 NOTE — ED Provider Notes (Signed)
Medical screening examination/treatment/procedure(s) were performed by non-physician practitioner and as supervising physician I was immediately available for consultation/collaboration.  Ethelda Chick, MD 09/23/12 [phone]

## 2012-09-23 NOTE — ED Provider Notes (Signed)
History     CSN: [id number]  Arrival date & time 09/23/12  1803   First MD Initiated Contact with Patient 09/23/12 1847      Chief Complaint  Patient presents with  . Foot Injury    (Consider location/radiation/quality/duration/timing/severity/associated sxs/prior treatment) Patient is a 72 y.o. female presenting with foot injury. The history is provided by the patient.  Foot Injury Location:  Foot Foot location:  L foot Pain details:    Severity:  Moderate Associated symptoms: no fever and no neck pain   Associated symptoms comment:  She reports she fell 2 days ago causing an injury to her left foot with swelling and pain. No open wound. No other injury. She is a resident at [facility] who ordered a [facility] X-ray this morning. She was sent here for treatment of minimally displaced fractures to 2nd, 3rd, 4th and 5th MT's distally.    Past Medical History  Diagnosis Date  . Depressive disorder, not elsewhere classified   . Chronic airway obstruction, not elsewhere classified   . Spinal stenosis   . Lumbago   . Esophageal reflux   . Anxiety state, unspecified   . Abnormality of gait   . Candidiasis of other urogenital sites     Past Surgical History  Procedure Laterality Date  . Cholecystectomy    . Appendectomy    . Abdominal hysterectomy    . Vertebroplasty      No family history on file.  History  Substance Use Topics  . Smoking status: Not on file  . Smokeless tobacco: Not on file  . Alcohol Use: No    OB History   Grav Para Term Preterm Abortions TAB SAB Ect Mult Living                  Review of Systems  Constitutional: Negative for fever.  HENT: Negative for neck pain.   Gastrointestinal: Negative.  Negative for abdominal pain.  Genitourinary: Negative for dysuria.  Musculoskeletal:       See HPI.  Skin: Negative.  Negative for wound.  Neurological: Negative.  Negative for numbness.    Allergies  Amoxicillin-pot clavulanate;  Prednisone; and Sulfonamide derivatives  Home Medications  No current outpatient prescriptions on file.  BP 112/58  Pulse 102  Temp(Src) 99.6 F (37.6 C) (Oral)  Resp 16  SpO2 92%  Physical Exam  Constitutional: She is oriented to person, place, and time. She appears well-developed and well-nourished.  HENT:  Head: Atraumatic.  Neck: Normal range of motion.  Pulmonary/Chest: Effort normal.  Musculoskeletal:  No neck or spinal tenderness. Left foot moderately swollen over dorsum. Minimal discoloration. No bony deformity. Toes are warm and cap rf is normal. Most tender to distal forefoot. Ankle non-tender. No calf tenderness or swelling.  Neurological: She is alert and oriented to person, place, and time.  Skin: Skin is warm and dry.    ED Course  Procedures (including critical care time)  Labs Reviewed - No data to display No results found.   No diagnosis found.  1. Metatarsal fractures   MDM  Written x-ray report reviewed. Findings as reported in HPI and are consistent with exam. Do not feel there is any benefit to re-filming. ORtho suggests post-op shoe and ortho follow up next week. Pain managed.         Arnoldo Hooker, PA-C 09/23/12 1913

## 2012-11-15 ENCOUNTER — Non-Acute Institutional Stay (SKILLED_NURSING_FACILITY): Payer: Medicare Other | Admitting: Adult Health

## 2012-11-15 DIAGNOSIS — F29 Unspecified psychosis not due to a substance or known physiological condition: Secondary | ICD-10-CM

## 2012-11-15 DIAGNOSIS — K219 Gastro-esophageal reflux disease without esophagitis: Secondary | ICD-10-CM

## 2012-11-15 DIAGNOSIS — F411 Generalized anxiety disorder: Secondary | ICD-10-CM

## 2012-11-15 DIAGNOSIS — E876 Hypokalemia: Secondary | ICD-10-CM

## 2012-11-15 DIAGNOSIS — M545 Low back pain: Secondary | ICD-10-CM

## 2012-11-15 DIAGNOSIS — J45909 Unspecified asthma, uncomplicated: Secondary | ICD-10-CM

## 2012-12-27 ENCOUNTER — Encounter: Payer: Self-pay | Admitting: Adult Health

## 2012-12-27 ENCOUNTER — Non-Acute Institutional Stay (SKILLED_NURSING_FACILITY): Payer: Medicare Other | Admitting: Adult Health

## 2012-12-27 DIAGNOSIS — E559 Vitamin D deficiency, unspecified: Secondary | ICD-10-CM

## 2012-12-27 DIAGNOSIS — J45909 Unspecified asthma, uncomplicated: Secondary | ICD-10-CM

## 2012-12-27 DIAGNOSIS — M545 Low back pain, unspecified: Secondary | ICD-10-CM

## 2012-12-27 DIAGNOSIS — E876 Hypokalemia: Secondary | ICD-10-CM

## 2012-12-27 DIAGNOSIS — K219 Gastro-esophageal reflux disease without esophagitis: Secondary | ICD-10-CM

## 2012-12-27 DIAGNOSIS — F29 Unspecified psychosis not due to a substance or known physiological condition: Secondary | ICD-10-CM

## 2012-12-27 DIAGNOSIS — F411 Generalized anxiety disorder: Secondary | ICD-10-CM

## 2012-12-27 NOTE — Progress Notes (Signed)
Patient ID: [name] [name], female   DOB: Aug 24, 1940, 72 y.o.   MRN: [id number]  [facility]   Allergies  Allergen Reactions  . Avelox [Moxifloxacin Hcl In Nacl] Hives  . Codeine     Reports her throat swelled in the past but has tolerated since.  . Compazine [Prochlorperazine Edisylate] Nausea And Vomiting  . Morphine And Related     Patient doesn't recall  . Penicillins     Patient doesn't recall.  . Phenothiazines     Patient doesn't recall  . Prednisone     Insomnia, confusion.  Marland Kitchen Reglan [Metoclopramide]     Patient doesn't recall "but it was awful"  . Sulfonamide Derivatives Hives    Chief Complaint  Patient presents with  . Medical Managment of Chronic Issues    HPI  She is being seen for the management of her chronic illnesses. Overall her status has remained stable over the recent past. Her vit d level is low and will require treatment. The nursing staff is not voicing any concerns and she states that she is doing well.   Past Medical History  Diagnosis Date  . Depressive disorder, not elsewhere classified   . Chronic airway obstruction, not elsewhere classified   . Spinal stenosis   . Lumbago   . Esophageal reflux   . Anxiety state, unspecified   . Abnormality of gait   . Candidiasis of other urogenital sites     Past Surgical History  Procedure Laterality Date  . Cholecystectomy    . Appendectomy    . Abdominal hysterectomy    . Vertebroplasty      Filed Vitals:   12/27/12 1539  BP: 114/66  Pulse: 68  Height: 5\' 1"  (1.549 m)  Weight: 201 lb (91.173 kg)    Patient's Medications   New Prescriptions     No medications on file   Previous Medications     ALPRAZOLAM (XANAX) 0.25 MG TABLET     Take 0.25 mg by mouth 2 (two) times daily.      BENZOCAINE-RESORCINOL (VAGICREAM) 5-2 % VAGINAL CREAM     Place 1 application vaginally 2 (two) times daily as needed for itching (and/or burning).     DIPHENHYDRAMINE  25 MG TABLET     Take 25 mg by mouth every 6  (six) hours as needed for itching.      ESCITALOPRAM (LEXAPRO) 10 MG TABLET     Take 10 mg by mouth daily.      FLUTICASONE-SALMETEROL (ADVAIR) 250-50 MCG/DOSE AEPB     Inhale 1 puff into the lungs every 12 (twelve) hours.     HYDROCODONE-ACETAMINOPHEN (NORCO/VICODIN) 5-325 MG PER TABLET     Take 1 tablet by mouth every 4 (four) hours as needed for pain.      IPRATROPIUM-ALBUTEROL (DUONEB) 0.5-2.5 (3) MG/3ML SOLN     Take 3 mLs by nebulization every 6 (six) hours as needed (shortness of breath).     LIDOCAINE (LIDODERM) 5 %     Place 2 patches onto the skin every morning. Apply two patches to lower back. Remove & Discard patch after 12 hours.     NYSTATIN (MYCOSTATIN/NYSTOP) 100000 UNIT/GM POWD     Apply 1 g topically daily. Apply to reddened skin folds daily until yeast infection resolved.     OMEPRAZOLE (PRILOSEC) 20 MG CAPSULE     Take 20 mg by mouth daily.     POTASSIUM CHLORIDE SA (K-DUR,KLOR-CON) 20 MEQ TABLET     Take 40  mEq by mouth daily.     PROMETHAZINE (PHENERGAN) 25 MG TABLET     Take 25 mg by mouth every 6 (six) hours as needed for nausea.     RISPERIDONE (RISPERDAL) 0.25 MG TABLET     Take 0.25 mg by mouth daily.     RISPERIDONE (RISPERDAL) 1 MG TABLET     Take 1 mg by mouth at bedtime.     TRAMADOL (ULTRAM) 50 MG TABLET     Take 100 mg by mouth every 6 (six) hours as needed for pain.   Modified Medications     No medications on file   Discontinued Medications     OXYCODONE-ACETAMINOPHEN (PERCOCET/ROXICET) 5-325 MG PER TABLET     Take 1 tablet by mouth once.     SIGNIFICANT DIAGNOSTIC EXAMS  06-01-11: ct of abdomen and pelvis: No evidence of traumatic injury to the abdomen/pelvis. No hemoperitoneum or free air. No evidence of bowel obstruction.  Extensive colonic diverticulosis, without associated inflammatory  changes. 06-03-11: chest x-ray: No evidence of acute cardiopulmonary disease. Cardiomegaly. 06-04-11: mri lumbar spine: Cervical spondylotic changes are noted but  incompletely evaluated. Remote T7 and T9 compression fracture treated with cement augmentation. Remote  mild superior endplate compression fracture T5 and minimal superior endplate compression fracture T6.No new compression fracture noted.Right T5 vertebral body hemangioma.Left T3 vertebral body bony lesion extends into the left T3 pedicle possibly minimally more prominent than on the prior exam.  Exact etiology indeterminate.  Attention to this on follow-up.  No other worrisome bony lesions noted. Scattered mild thoracic spine degenerative changes appear similar to that of the prior exam with most notable finding the facet joint bony overgrowth and spinal stenosis at the T10-11 level causing impression upon the dorsal aspect of the cord. 06-04-11: lumbar spine mri: No acute compression fracture.Degenerative changes relatively stable since prior exam with findings most prominent at the L3-4 level 06-04-11: 2-d echo: Left ventricle: The cavity size was normal. Systolic function was normal. The estimated ejection fraction was in the range of 50% to 55%.  Akinesis of the apical myocardium. Severe hypokinesis of the mid-distal anteroseptal myocardium 06-07-11: chest x-ray:  No evidence of active pulmonary disease. 06-07-11: ct of head:  No evidence of acute intracranial abnormality. 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 06-07-11: ct of cervical spine:  No evidence of traumatic injury to the cervical spine.Multilevel degenerative changes. 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 diffusely no acute fracture or malalignment   04-14-12: chest x-ray: negative for acute disease   09-23-12: left foot x-ray: transverse minimally displaced fracture involving distal 2-3-4-5 metatarsal   09-23-12: left ankle x-ray: no acute abnormality    LABS REVIEWED:   03-21-12: wbc 11.0; hgb 12.0; hct 37.6; mcv 90.6; plt 340; glucose 102; bun 11; creat 0.46; k+ 4.2; na++  141 liver normal albumin 3.6; vit d 28   06-27-12: hgb a1c 6.9   09-07-12: wbc 11.;3 hgb 11.2; hct 34.5; mcv 85.2; plt 349; chol 173; ldl 105; trig 147; hgb a1c 7.5  11-16-12: wbc 8.8; hgb 11.3; hct 34.7; mcv 82.6; plt 388; glucose 116; bun 11; creat .58; k+4.1; na++141; liver normal albumin 3.5; chol 172; ;ldl 94; trig 196;hgb a1c 7.9  11-28-12: tsh 2.316; vit d 11     Review of Systems  Constitutional: Negative for malaise/fatigue.  Respiratory: Negative for cough, shortness of breath and wheezing.   Cardiovascular: Negative for chest pain and leg swelling.  Gastrointestinal: Negative for heartburn, abdominal  pain and constipation.  Genitourinary: Negative for dysuria.  Musculoskeletal: Negative for myalgias, back pain and joint pain.        Her pain is being managed  Skin: Negative.   Neurological: Negative for weakness and headaches.  Psychiatric/Behavioral: Negative for depression. The patient is not nervous/anxious and does not have insomnia.    Physical Exam  Constitutional: She appears well-developed and well-nourished.  overweight  Neck: Neck supple. No JVD present. No thyromegaly present.  Cardiovascular: Normal rate, regular rhythm and intact distal pulses.   Respiratory: Effort normal and breath sounds normal. No respiratory distress.  GI: Soft. Bowel sounds are normal. She exhibits no distension. There is no tenderness.  Musculoskeletal: Normal range of motion. She exhibits no edema.  Neurological: She is alert.  Skin: Skin is warm and dry.  Psychiatric: She has a normal mood and affect.      ASSESSMENT/ PLAN:  ASTHMA Is stable will continue advair 250/50 twice daily and duoneb every 6 hours as needed and will monitor her status   G E R D Will continue her prilosec 20 mg daily   Anxiety state, unspecified Will continue lexapro 10 mg daily and xanax 0.25 mg twice daily and will continue to monitor her status   Psychosis She is doing well is obsessing over  vaginal itching and is able to function; will continue her risperdal 0.25 mg in the am and 1 mg nightly   Hypokalemia Will continue k+ 40 meq daily   Low back pain Is being managed will continue 2 lidoderm patches to her lower back and vicodin 5/325 mg every 4 hours as needed for pain and will monitor  Vitamin d deficiency  Will begin vit d 50,0000 units  weekly for 3 months then 200 units daily

## 2013-01-20 ENCOUNTER — Encounter: Payer: Self-pay | Admitting: Adult Health

## 2013-01-20 DIAGNOSIS — F411 Generalized anxiety disorder: Secondary | ICD-10-CM | POA: Insufficient documentation

## 2013-01-20 DIAGNOSIS — E876 Hypokalemia: Secondary | ICD-10-CM | POA: Insufficient documentation

## 2013-01-20 DIAGNOSIS — F29 Unspecified psychosis not due to a substance or known physiological condition: Secondary | ICD-10-CM | POA: Insufficient documentation

## 2013-01-20 DIAGNOSIS — G8929 Other chronic pain: Secondary | ICD-10-CM | POA: Insufficient documentation

## 2013-01-20 DIAGNOSIS — M545 Low back pain: Secondary | ICD-10-CM | POA: Insufficient documentation

## 2013-01-20 NOTE — Assessment & Plan Note (Signed)
Is being managed will continue 2 lidoderm patches to her lower back and vicodin 5/325 mg every 4 hours as needed for pain and will monitor

## 2013-01-20 NOTE — Assessment & Plan Note (Signed)
Is stable will continue advair 250/50 twice daily and duoneb every 6 hours as needed and will monitor her status

## 2013-01-20 NOTE — Assessment & Plan Note (Signed)
She is doing well is obsessing over vaginal itching and is able to function; will continue her risperdal 0.25 mg in the am and 1 mg nightly

## 2013-01-20 NOTE — Assessment & Plan Note (Signed)
Will continue her prilosec 20 mg daily

## 2013-01-20 NOTE — Progress Notes (Signed)
Patient ID: [name] [name], female   DOB: 1941-04-23, 72 y.o.   MRN: [id number]  FACILITY: [facility]  Allergies  Allergen Reactions  . Avelox (Moxifloxacin Hcl In Nacl) Hives  . Codeine     Reports her throat swelled in the past but has tolerated since.  . Compazine (Prochlorperazine Edisylate) Nausea And Vomiting  . Morphine And Related     Patient doesn't recall  . Penicillins     Patient doesn't recall.  . Phenothiazines     Patient doesn't recall  . Prednisone     Insomnia, confusion.  . Reglan (Metoclopramide)     Patient doesn't recall "but it was awful"  . Sulfonamide Derivatives Hives     Chief Complaint  Patient presents with  . Medical Managment of Chronic Issues    HPI:  She is being seen for the management of her chronic illnesses; overall she is doing well; the risperdal si doing well in helping her to control the obsession with vaginal itching and "infections".  The is not voicing any concerns and neither is the nursing staff at this time    Past Medical History  Diagnosis Date  . Depressive disorder, not elsewhere classified   . Chronic airway obstruction, not elsewhere classified   . Spinal stenosis   . Lumbago   . Esophageal reflux   . Anxiety state, unspecified   . Abnormality of gait   . Candidiasis of other urogenital sites     Past Surgical History  Procedure Laterality Date  . Cholecystectomy    . Appendectomy    . Abdominal hysterectomy    . Vertebroplasty      VITAL SIGNS BP 138/76  Pulse 70  Ht 5\' 1"  (1.549 m)  Wt 200 lb (90.719 kg)  BMI 37.81 kg/m2   Patient's Medications  New Prescriptions   No medications on file  Previous Medications   ALPRAZOLAM (XANAX) 0.25 MG TABLET    Take 0.25 mg by mouth 2 (two) times daily.    BENZOCAINE-RESORCINOL (VAGICREAM) 5-2 % VAGINAL CREAM    Place 1 application vaginally 2 (two) times daily as needed for itching (and/or burning).   DIPHENHYDRAMINE (SOMINEX) 25 MG TABLET    Take 25 mg by  mouth every 6 (six) hours as needed for itching.    ESCITALOPRAM (LEXAPRO) 10 MG TABLET    Take 10 mg by mouth daily.    FLUTICASONE-SALMETEROL (ADVAIR) 250-50 MCG/DOSE AEPB    Inhale 1 puff into the lungs every 12 (twelve) hours.   HYDROCODONE-ACETAMINOPHEN (NORCO/VICODIN) 5-325 MG PER TABLET    Take 1 tablet by mouth every 4 (four) hours as needed for pain.    IPRATROPIUM-ALBUTEROL (DUONEB) 0.5-2.5 (3) MG/3ML SOLN    Take 3 mLs by nebulization every 6 (six) hours as needed (shortness of breath).   LIDOCAINE (LIDODERM) 5 %    Place 2 patches onto the skin every morning. Apply two patches to lower back. Remove & Discard patch after 12 hours.   NYSTATIN (MYCOSTATIN/NYSTOP) 100000 UNIT/GM POWD    Apply 1 g topically daily. Apply to reddened skin folds daily until yeast infection resolved.   OMEPRAZOLE (PRILOSEC) 20 MG CAPSULE    Take 20 mg by mouth daily.   POTASSIUM CHLORIDE SA (K-DUR,KLOR-CON) 20 MEQ TABLET    Take 40 mEq by mouth daily.   PROMETHAZINE (PHENERGAN) 25 MG TABLET    Take 25 mg by mouth every 6 (six) hours as needed for nausea.   RISPERIDONE (RISPERDAL) 0.25 MG TABLET  Take 0.25 mg by mouth daily.   RISPERIDONE (RISPERDAL) 1 MG TABLET    Take 1 mg by mouth at bedtime.   TRAMADOL (ULTRAM) 50 MG TABLET    Take 100 mg by mouth every 6 (six) hours as needed for pain.  Modified Medications   No medications on file  Discontinued Medications   OXYCODONE-ACETAMINOPHEN (PERCOCET/ROXICET) 5-325 MG PER TABLET    Take 1 tablet by mouth once.    SIGNIFICANT DIAGNOSTIC EXAMS  06-01-11: ct of abdomen and pelvis: No evidence of traumatic injury to the abdomen/pelvis. No hemoperitoneum or free air. No evidence of bowel obstruction.  Extensive colonic diverticulosis, without associated inflammatory  changes. 06-03-11: chest x-ray: No evidence of acute cardiopulmonary disease. Cardiomegaly. 06-04-11: mri lumbar spine: Cervical spondylotic changes are noted but incompletely evaluated. Remote T7 and  T9 compression fracture treated with cement augmentation. Remote  mild superior endplate compression fracture T5 and minimal superior endplate compression fracture T6.No new compression fracture noted.Right T5 vertebral body hemangioma.Left T3 vertebral body bony lesion extends into the left T3 pedicle possibly minimally more prominent than on the prior exam.  Exact etiology indeterminate.  Attention to this on follow-up.  No other worrisome bony lesions noted. Scattered mild thoracic spine degenerative changes appear similar to that of the prior exam with most notable finding the facet joint bony overgrowth and spinal stenosis at the T10-11 level causing impression upon the dorsal aspect of the cord. 06-04-11: lumbar spine mri: No acute compression fracture.Degenerative changes relatively stable since prior exam with findings most prominent at the L3-4 level 06-04-11: 2-d echo: Left ventricle: The cavity size was normal. Systolic function was normal. The estimated ejection fraction was in the range of 50% to 55%.  Akinesis of the apical myocardium. Severe hypokinesis of the mid-distal anteroseptal myocardium 06-07-11: chest x-ray:  No evidence of active pulmonary disease. 06-07-11: ct of head:  No evidence of acute intracranial abnormality. 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 06-07-11: ct of cervical spine:  No evidence of traumatic injury to the cervical spine.Multilevel degenerative changes. 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 diffusely no acute fracture or malalignment  04-14-12: chest x-ray: negative for acute disease  09-23-12: left foot x-ray: transverse minimally displaced fracture involving distal 2-3-4-5 metatarsal  09-23-12: left ankle x-ray: no acute abnormality    LABS REVIEWED:   03-21-12: wbc 11.0; hgb 12.0; hct 37.6; mcv 90.6; plt 340; glucose 102; bun 11; creat 0.46; k+ 4.2; na++ 141 liver normal albumin 3.6; vit d 28   06-27-12: hgb a1c 6.9  09-07-12: wbc 11.;3 hgb 11.2; hct 34.5; mcv 85.2; plt 349; chol 173; ldl 105; trig 147; hgb a1c 7.5     Review of Systems  Constitutional: Negative for malaise/fatigue.  Respiratory: Negative for cough, shortness of breath and wheezing.   Cardiovascular: Negative for chest pain and leg swelling.  Gastrointestinal: Negative for heartburn, abdominal pain and constipation.  Genitourinary: Negative for dysuria.  Musculoskeletal: Negative for myalgias, back pain and joint pain.       Her pain is being managed  Skin: Negative.   Neurological: Negative for weakness and headaches.  Psychiatric/Behavioral: Negative for depression. The patient is not nervous/anxious and does not have insomnia.    Physical Exam  Constitutional: She appears well-developed and well-nourished.  overweight  Neck: Neck supple. No JVD present. No thyromegaly present.  Cardiovascular: Normal rate, regular rhythm and intact distal pulses.   Respiratory: Effort normal and breath sounds normal. No respiratory distress.  GI: Soft. Bowel sounds are normal. She exhibits no distension. There is no tenderness.  Musculoskeletal: Normal range of motion. She exhibits no edema.  Neurological: She is alert.  Skin: Skin is warm and dry.  Psychiatric: She has a normal mood and affect.      ASSESSMENT/ PLAN:  ASTHMA Is stable will continue advair 250/50 twice daily and duoneb every 6 hours as needed and will monitor her status   G E R D Will continue her prilosec 20 mg daily   Anxiety state, unspecified Will continue lexapro 10 mg daily and xanax 0.25 mg twice daily and will continue to monitor her status   Psychosis She is doing well is obsessing over vaginal itching and is able to function; will continue her risperdal 0.25 mg in the am and 1 mg nightly   Hypokalemia Will continue k+ 40 meq daily   Low back pain Is being managed will continue 2 lidoderm patches to her lower back and vicodin  5/325 mg every 4 hours as needed for pain and will monitor   Will check cbc; cmp lipids hgb a1c

## 2013-01-20 NOTE — Assessment & Plan Note (Signed)
Will continue lexapro 10 mg daily and xanax 0.25 mg twice daily and will continue to monitor her status

## 2013-01-20 NOTE — Assessment & Plan Note (Signed)
Will continue k+ 40 meq daily  

## 2013-02-09 ENCOUNTER — Encounter: Payer: Self-pay | Admitting: Nurse Practitioner

## 2013-02-09 ENCOUNTER — Non-Acute Institutional Stay (SKILLED_NURSING_FACILITY): Payer: Medicare Other | Admitting: Nurse Practitioner

## 2013-02-09 DIAGNOSIS — K219 Gastro-esophageal reflux disease without esophagitis: Secondary | ICD-10-CM

## 2013-02-09 DIAGNOSIS — J45909 Unspecified asthma, uncomplicated: Secondary | ICD-10-CM

## 2013-02-09 DIAGNOSIS — R7309 Other abnormal glucose: Secondary | ICD-10-CM

## 2013-02-09 DIAGNOSIS — R739 Hyperglycemia, unspecified: Secondary | ICD-10-CM

## 2013-02-09 DIAGNOSIS — F29 Unspecified psychosis not due to a substance or known physiological condition: Secondary | ICD-10-CM

## 2013-02-09 DIAGNOSIS — F411 Generalized anxiety disorder: Secondary | ICD-10-CM

## 2013-02-09 NOTE — Progress Notes (Signed)
Patient ID: [name] [name], female   DOB: July 24, 1941, 72 y.o.   MRN: [id number]  Nursing Home Location:  [facility]   Place of Service: [phone])   Chief Complaint: medical management of chronic conditions   HPI:  72 year old female who is a long term resident of [name] is being seen today for routine follow up.    ASTHMA  Is stable on  advair 250/50 twice daily and duoneb every 6 hours as needed  G E R D  Stable on  prilosec 20 mg daily  Anxiety state, unspecified  Doing well on  lexapro 10 mg daily and xanax 0.25 mg twice daily as needed   Psychosis  Stable Pt with obsession over vaginal itching was started on risperdal 0.25 mg in the am and 1 mg nightly and now this has improved  Hypokalemia  Will continue k+ 40 meq daily  Low back pain  Stable on 2 lidoderm patches to her lower back and vicodin 5/325 mg every 4 hours as needed for pain     Review of Systems:  Review of Systems  Constitutional: Negative for fever, chills and malaise/fatigue.  Respiratory: Negative for cough and shortness of breath.   Cardiovascular: Negative for chest pain.  Gastrointestinal: Negative for heartburn, abdominal pain, diarrhea and constipation.  Genitourinary: Negative for dysuria, urgency and frequency.  Musculoskeletal: Negative for myalgias and joint pain.  Skin: Negative.  Negative for itching and rash.  Neurological: Negative for dizziness, weakness and headaches.  Psychiatric/Behavioral: Negative for depression. The patient is not nervous/anxious and does not have insomnia.     Medications: Patient's Medications  New Prescriptions   No medications on file  Previous Medications   ALPRAZOLAM (XANAX) 0.25 MG TABLET    Take 0.25 mg by mouth 2 (two) times daily as needed.    BENZOCAINE-RESORCINOL (VAGICREAM) 5-2 % VAGINAL CREAM    Place 1 application vaginally 2 (two) times daily as needed for itching (and/or burning).   DIPHENHYDRAMINE (SOMINEX) 25 MG TABLET     Take 25 mg by mouth every 6 (six) hours as needed for itching.    ESCITALOPRAM (LEXAPRO) 10 MG TABLET    Take 10 mg by mouth daily.    FLUTICASONE-SALMETEROL (ADVAIR) 250-50 MCG/DOSE AEPB    Inhale 1 puff into the lungs every 12 (twelve) hours.   HYDROCODONE-ACETAMINOPHEN (NORCO/VICODIN) 5-325 MG PER TABLET    Take 1 tablet by mouth every 4 (four) hours as needed for pain.    IPRATROPIUM-ALBUTEROL (DUONEB) 0.5-2.5 (3) MG/3ML SOLN    Take 3 mLs by nebulization every 6 (six) hours as needed (shortness of breath).   LIDOCAINE (LIDODERM) 5 %    Place 2 patches onto the skin every morning. Apply two patches to lower back. Remove & Discard patch after 12 hours.   NYSTATIN (MYCOSTATIN/NYSTOP) 100000 UNIT/GM POWD    Apply 1 g topically daily. Apply to reddened skin folds daily until yeast infection resolved.   OMEPRAZOLE (PRILOSEC) 20 MG CAPSULE    Take 20 mg by mouth daily.   POTASSIUM CHLORIDE SA (K-DUR,KLOR-CON) 20 MEQ TABLET    Take 40 mEq by mouth daily.   PROMETHAZINE (PHENERGAN) 25 MG TABLET    Take 25 mg by mouth every 6 (six) hours as needed for nausea.   RISPERIDONE (RISPERDAL) 0.25 MG TABLET    Take 0.25 mg by mouth daily.   RISPERIDONE (RISPERDAL) 1 MG TABLET    Take 1 mg by mouth at bedtime.  TRAMADOL (ULTRAM) 50 MG TABLET    Take 100 mg by mouth every 6 (six) hours as needed for pain.   VITAMIN D, ERGOCALCIFEROL, (DRISDOL) 50000 UNITS CAPS    Take 50,000 Units by mouth every 7 (seven) days.  Modified Medications   No medications on file  Discontinued Medications   No medications on file     Physical Exam:  Filed Vitals:   02/09/13 1118  BP: 132/68  Pulse: 75  Temp: 98.2 F (36.8 C)  Resp: 20    Physical Exam  Constitutional: She is oriented to person, place, and time and well-developed, well-nourished, and in no distress. No distress.  HENT:  Head: Normocephalic and atraumatic.  Eyes: Conjunctivae and EOM are normal. Pupils are equal, round, and reactive to light.  Neck:  Normal range of motion. Neck supple. No thyromegaly present.  Cardiovascular: Normal rate and regular rhythm.   Murmur heard. Pulmonary/Chest: Effort normal and breath sounds normal. No respiratory distress.  Abdominal: Soft. Bowel sounds are normal. She exhibits no distension. There is no tenderness.  Musculoskeletal: She exhibits no edema and no tenderness.  Neurological: She is alert and oriented to person, place, and time.  Skin: Skin is warm and dry. She is not diaphoretic.  Psychiatric: Affect normal.     Labs reviewed: 03-21-12: wbc 11.0; hgb 12.0; hct 37.6; mcv 90.6; plt 340; glucose 102; bun 11; creat 0.46; k+ 4.2; na++ 141  liver normal albumin 3.6; vit d 28  06-27-12: hgb a1c 6.9  09-07-12: wbc 11.;3 hgb 11.2; hct 34.5; mcv 85.2; plt 349; chol 173; ldl 105; trig 147; hgb a1c 7.5  11-28-12 TSH 2.3  hgb A1C 7.9  Lipids- total cholesterol 172, triglyceride 196, HDL 39, LDL94  Sodium 141, potassium 4.1, glucose 116, BUN 11, Cr 0.58  Wbc 8.8, rbc 4.2, hgb 11.3, hct 34.7  Assessment/Plan  1.   Anxiety state, unspecified 300.00     Stable on current medications   2.   ASTHMA 493.90     stable   3.   G E R D 530.81     Patient is stable; continue current regimen. Will monitor and make changes as necessary.   4.   Psychosis 298.9     Improved. Will cont current medicaitons   5.   Hyperglycemia   Will get CBGs ACHS at this time and recheck a HGB A1c

## 2013-04-17 ENCOUNTER — Encounter: Payer: Self-pay | Admitting: Internal Medicine

## 2013-04-17 ENCOUNTER — Non-Acute Institutional Stay (SKILLED_NURSING_FACILITY): Payer: Medicare Other | Admitting: Internal Medicine

## 2013-04-17 DIAGNOSIS — F0150 Vascular dementia without behavioral disturbance: Secondary | ICD-10-CM

## 2013-04-17 DIAGNOSIS — E1149 Type 2 diabetes mellitus with other diabetic neurological complication: Secondary | ICD-10-CM

## 2013-04-17 DIAGNOSIS — F22 Delusional disorders: Secondary | ICD-10-CM

## 2013-04-17 DIAGNOSIS — M545 Low back pain, unspecified: Secondary | ICD-10-CM

## 2013-04-17 DIAGNOSIS — F0151 Vascular dementia with behavioral disturbance: Secondary | ICD-10-CM

## 2013-04-17 DIAGNOSIS — J45909 Unspecified asthma, uncomplicated: Secondary | ICD-10-CM

## 2013-04-17 DIAGNOSIS — F0152 Vascular dementia, unspecified severity, with psychotic disturbance: Secondary | ICD-10-CM

## 2013-04-17 MED ORDER — DONEPEZIL HCL 10 MG PO TABS: 10.0000 mg | ORAL_TABLET | Freq: Every evening | ORAL | Status: DC | PRN

## 2013-04-17 NOTE — Progress Notes (Signed)
Patient ID: [name] [name], female   DOB: 1940/11/05, 72 y.o.   MRN: [id number] Location:  [facility] Provider:  Gwenith Spitz. Renato Gails, D.O., C.M.D.  Code Status:  DNR  Chief Complaint  Patient presents with  . Medical Managment of Chronic Issues    HPI:  72 yo female seen for routine visit to medically manage her chronic diseases.  She has recently been stable w/o difficulty with psychosis or fixation about yeast infections that she was previously having.  Her diabetes control is slightly improved.    Review of Systems:  Review of Systems  Constitutional: Negative for fever and chills.  Eyes: Negative for blurred vision.  Respiratory: Negative for cough.   Cardiovascular: Negative for chest pain.  Gastrointestinal: Negative for constipation.  Genitourinary: Positive for dysuria.  Musculoskeletal: Negative for myalgias and falls.  Skin: Positive for itching.  Neurological: Negative for dizziness and headaches.  Endo/Heme/Allergies:       Diabetes  Psychiatric/Behavioral: Positive for depression and memory loss. The patient is nervous/anxious.     Medications: Patient's Medications  New Prescriptions   No medications on file  Previous Medications   ACETAMINOPHEN (TYLENOL) 325 MG TABLET    Take 325 mg by mouth every 4 (four) hours as needed for pain.   ALPRAZOLAM (XANAX) 0.25 MG TABLET    Take 0.25 mg by mouth 2 (two) times daily as needed.    BENZOCAINE-RESORCINOL (VAGICREAM) 5-2 % VAGINAL CREAM    Place 1 application vaginally 2 (two) times daily as needed for itching (and/or burning).   CHOLECALCIFEROL (VITAMIN D) 2000 UNITS CAPS    Take 1 capsule by mouth daily.   DIPHENHYDRAMINE (SOMINEX) 25 MG TABLET    Take 25 mg by mouth every 6 (six) hours as needed for itching.    DULOXETINE (CYMBALTA) 60 MG CAPSULE    Take 60 mg by mouth daily.   ESCITALOPRAM (LEXAPRO) 10 MG TABLET    Take 10 mg by mouth daily.    FLUTICASONE-SALMETEROL (ADVAIR) 250-50 MCG/DOSE AEPB     Inhale 1 puff into the lungs every 12 (twelve) hours.   HYDROCODONE-ACETAMINOPHEN (NORCO/VICODIN) 5-325 MG PER TABLET    Take 1 tablet by mouth every 4 (four) hours as needed for pain.    IPRATROPIUM-ALBUTEROL (DUONEB) 0.5-2.5 (3) MG/3ML SOLN    Take 3 mLs by nebulization every 6 (six) hours as needed (shortness of breath).   LIDOCAINE (LIDODERM) 5 %    Place 2 patches onto the skin every morning. Apply two patches to lower back. Remove & Discard patch after 12 hours.   NYSTATIN (MYCOSTATIN/NYSTOP) 100000 UNIT/GM POWD    Apply 1 g topically daily. Apply to reddened skin folds daily until yeast infection resolved.   OMEPRAZOLE (PRILOSEC) 20 MG CAPSULE    Take 20 mg by mouth daily.   POTASSIUM CHLORIDE SA (K-DUR,KLOR-CON) 20 MEQ TABLET    Take 40 mEq by mouth daily.   PROMETHAZINE (PHENERGAN) 25 MG TABLET    Take 25 mg by mouth every 6 (six) hours as needed for nausea.   RISPERIDONE (RISPERDAL) 0.25 MG TABLET    Take 0.75 mg by mouth at bedtime.    TRAMADOL (ULTRAM) 50 MG TABLET    Take 100 mg by mouth every 6 (six) hours as needed for pain.  Modified Medications   No medications on file  Discontinued Medications   RISPERIDONE (RISPERDAL) 1 MG TABLET    Take 1 mg by mouth at bedtime.   VITAMIN D, ERGOCALCIFEROL, (DRISDOL)  50000 UNITS CAPS    Take 50,000 Units by mouth every 7 (seven) days.    Physical Exam: Filed Vitals:   04/12/13 2100  BP: 120/64  Pulse: 74  Temp: 98 F (36.7 C)  Resp: 18  Physical Exam  Constitutional:  Obese female, nad  HENT:  Head: Normocephalic and atraumatic.  Eyes: EOM are normal. Pupils are equal, round, and reactive to light.  Cardiovascular: Normal rate, regular rhythm, normal heart sounds and intact distal pulses.   Pulmonary/Chest: Effort normal and breath sounds normal. No respiratory distress.  Abdominal: Soft. Bowel sounds are normal. She exhibits no distension. There is no tenderness.  Musculoskeletal: Normal range of motion. She exhibits no edema  and no tenderness.  Neurological: She is alert.  Skin: Skin is warm and dry.   Labs reviewed: 11/28/12:  TSH 2.316, Vit D 11 02/13/13:  hba1c 7.2, b12 312, folate >20 03/20/13:  hba1c 7.1, Na 140, K 4, BUN 7, cr 0.49, glucose 119 03/21/13:  Urine microalbumin 1.84, UA neg 03/23/13:  Urine culture + Ecoli was treated with macrodantin  Assessment/Plan 1. Vascular dementia with paranoia Cont aricept, psychosis controlled better recently with low dose risperdal  2. Unspecified asthma(493.90) Stable on current therapy  3. Low back pain -cont lidoderm patch and hydrocodone, high risk for delirium if meds changed  4. Type II or unspecified type diabetes mellitus with neurological manifestations, not stated as uncontrolled(250.60) -pt does not acknowledge that she has diabetes--denies this -may be antipsychotic related, but has not tolerated stopping antipsychotics altogether  Family/ staff Communication: discussed with nursing staff Goals of care: DNR Labs/tests ordered:  Check vitamin D level

## 2013-06-02 ENCOUNTER — Non-Acute Institutional Stay (SKILLED_NURSING_FACILITY): Payer: Medicare Other | Admitting: Internal Medicine

## 2013-06-02 DIAGNOSIS — N390 Urinary tract infection, site not specified: Secondary | ICD-10-CM | POA: Diagnosis not present

## 2013-06-02 DIAGNOSIS — Z5189 Encounter for other specified aftercare: Secondary | ICD-10-CM

## 2013-06-06 ENCOUNTER — Encounter: Payer: Self-pay | Admitting: Internal Medicine

## 2013-06-06 NOTE — Progress Notes (Signed)
MRN: [id number] Name: [name] [name]  Sex: female Age: 72 y.o. DOB: Dec 07, 1940  PSC #: Ronni Rumble Facility/Room: 219B Level Of Care: SNF Provider: Merrilee Seashore D Emergency Contacts: Extended Emergency Contact Information Primary Emergency Contact: Genevie Ann) Address: [address]          [place] Home Phone: [phone] Relation: Son Secondary Emergency Contact: Gwendolyn Fill [place] Home Phone: [phone] Work Phone: [phone] Relation: Son  Code Status:   Allergies: Avelox; Codeine; Compazine; Morphine and related; Penicillins; Phenothiazines; Prednisone; Reglan; and Sulfonamide derivatives  Chief Complaint  Patient presents with  . Acute Visit    HPI: Patient is 72 y.o. female who has a UTI.  Past Medical History  Diagnosis Date  . Depressive disorder, not elsewhere classified   . Chronic airway obstruction, not elsewhere classified   . Spinal stenosis   . Lumbago   . Esophageal reflux   . Anxiety state, unspecified   . Abnormality of gait   . Candidiasis of other urogenital sites     Past Surgical History  Procedure Laterality Date  . Cholecystectomy    . Appendectomy    . Abdominal hysterectomy    . Vertebroplasty        Medication List       This list is accurate as of: 06/02/13 11:59 PM.  Always use your most recent med list.               acetaminophen 325 MG tablet  Commonly known as:  TYLENOL  Take 325 mg by mouth every 4 (four) hours as needed for pain.     ALPRAZolam 0.25 MG tablet  Commonly known as:  XANAX  Take 0.25 mg by mouth 2 (two) times daily as needed.     diphenhydrAMINE 25 MG tablet  Commonly known as:  SOMINEX  Take 25 mg by mouth every 6 (six) hours as needed for itching.     donepezil 10 MG tablet  Commonly known as:  ARICEPT  Take 1 tablet (10 mg total) by mouth at bedtime as needed.     DULoxetine 60 MG capsule  Commonly known as:   CYMBALTA  Take 60 mg by mouth daily.     escitalopram 10 MG tablet  Commonly known as:  LEXAPRO  Take 10 mg by mouth daily.     Fluticasone-Salmeterol 250-50 MCG/DOSE Aepb  Commonly known as:  ADVAIR  Inhale 1 puff into the lungs every 12 (twelve) hours.     HYDROcodone-acetaminophen 5-325 MG per tablet  Commonly known as:  NORCO/VICODIN  Take 1 tablet by mouth every 4 (four) hours as needed for pain.     ipratropium-albuterol 0.5-2.5 (3) MG/3ML Soln  Commonly known as:  DUONEB  Take 3 mLs by nebulization every 6 (six) hours as needed (shortness of breath).     lidocaine 5 %  Commonly known as:  LIDODERM  Place 2 patches onto the skin every morning. Apply two patches to lower back. Remove & Discard patch after 12 hours.     nystatin 100000 UNIT/GM Powd  Apply 1 g topically daily. Apply to reddened skin folds daily until yeast infection resolved.     omeprazole 20 MG capsule  Commonly known as:  PRILOSEC  Take 20 mg by mouth daily.     potassium chloride SA 20 MEQ tablet  Commonly known as:  K-DUR,KLOR-CON  Take 40 mEq by mouth daily.     promethazine 25 MG tablet  Commonly known as:  PHENERGAN  Take 25 mg by mouth every 6 (six) hours as needed for nausea.     risperiDONE 0.25 MG tablet  Commonly known as:  RISPERDAL  Take 0.75 mg by mouth at bedtime.     traMADol 50 MG tablet  Commonly known as:  ULTRAM  Take 100 mg by mouth every 6 (six) hours as needed for pain.     VAGICREAM 5-2 % vaginal cream  Generic drug:  benzocaine-resorcinol  Place 1 application vaginally 2 (two) times daily as needed for itching (and/or burning).     Vitamin D 2000 UNITS Caps  Take 1 capsule by mouth daily.        No orders of the defined types were placed in this encounter.     There is no immunization history on file for this patient.  History  Substance Use Topics  . Smoking status: Former Games developer  . Smokeless tobacco: Not on file  . Alcohol Use: No    Review of  Systems  DATA OBTAINED: from patient;PT IS SOMEWHAT VAGUE GENERAL: Feels well no fevers, fatigue, appetite changes SKIN: No itching, rash HEENT: No complaint RESPIRATORY: No cough, wheezing, SOB CARDIAC: No chest pain, palpitations, lower extremity edema  GI: No abdominal pain, No N/V/D or constipation, No heartburn or reflux  GU: No dysuria, frequency or urgency, or incontinence  MUSCULOSKELETAL: No unrelieved bone/joint pain NEUROLOGIC: No headache, dizziness or focal weakness PSYCHIATRIC: No overt anxiety or sadness. Sleeps well.   Filed Vitals:   06/06/13 1908  BP: 125/72  Pulse: 73  Temp: 97.5 F (36.4 C)  Resp: 18    Physical Exam  GENERAL APPEARANCE: Alert, MODERATELY conversant. Appropriately groomed. No acute distress  SKIN: No diaphoresis rash, or wounds HEENT: Unremarkable RESPIRATORY: Breathing is even, unlabored. Lung sounds are clear   CARDIOVASCULAR: Heart RRR no murmurs, rubs or gallops. No peripheral edema  GASTROINTESTINAL: Abdomen is soft, non-tender, not distended w/ normal bowel sounds.  GENITOURINARY: Bladder non tender, not distended;no CVA tender NEURO-O X 2, no new focal lesions Patient Active Problem List   Diagnosis Date Noted  . Anxiety state, unspecified 01/20/2013  . Psychosis 01/20/2013  . Hypokalemia 01/20/2013  . Low back pain 01/20/2013  . ASTHMA 01/16/2008  . G E R D 01/16/2008      Assessment and Plan  UTI- > 100,000 colonies of E. Coli; pt is allergic to multiple antibiotics- will start macrobid 100 mg BID for 7 days  Margit Hanks, MD

## 2013-06-29 ENCOUNTER — Other Ambulatory Visit: Payer: Self-pay

## 2013-06-29 MED ORDER — ALPRAZOLAM 0.25 MG PO TABS: 0.2500 mg | ORAL_TABLET | Freq: Two times a day (BID) | ORAL | Status: DC | PRN

## 2013-07-10 ENCOUNTER — Non-Acute Institutional Stay (SKILLED_NURSING_FACILITY): Payer: Medicare Other | Admitting: Nurse Practitioner

## 2013-07-10 DIAGNOSIS — E559 Vitamin D deficiency, unspecified: Secondary | ICD-10-CM

## 2013-07-10 DIAGNOSIS — F0391 Unspecified dementia with behavioral disturbance: Secondary | ICD-10-CM

## 2013-07-10 DIAGNOSIS — R7309 Other abnormal glucose: Secondary | ICD-10-CM

## 2013-07-10 DIAGNOSIS — D649 Anemia, unspecified: Secondary | ICD-10-CM

## 2013-07-10 DIAGNOSIS — F29 Unspecified psychosis not due to a substance or known physiological condition: Secondary | ICD-10-CM

## 2013-07-10 DIAGNOSIS — K219 Gastro-esophageal reflux disease without esophagitis: Secondary | ICD-10-CM

## 2013-07-10 DIAGNOSIS — M545 Low back pain: Secondary | ICD-10-CM

## 2013-07-10 DIAGNOSIS — J45909 Unspecified asthma, uncomplicated: Secondary | ICD-10-CM

## 2013-07-10 NOTE — Progress Notes (Signed)
Patient ID: [name] [name], female   DOB: 10/03/1940, 72 y.o.   MRN: [id number]    Nursing Home Location:  [facility]   Place of Service: SNF (31)  PCP: REED, TIFFANY, DO  Allergies  Allergen Reactions  . Avelox [Moxifloxacin Hcl In Nacl] Hives  . Codeine     Reports her throat swelled in the past but has tolerated since.  . Compazine [Prochlorperazine Edisylate] Nausea And Vomiting  . Morphine And Related     Patient doesn't recall  . Penicillins     Patient doesn't recall.  . Phenothiazines     Patient doesn't recall  . Prednisone     Insomnia, confusion.  Marland Kitchen Reglan [Metoclopramide]     Patient doesn't recall "but it was awful"  . Sulfonamide Derivatives Hives    Chief Complaint  Patient presents with  . Medical Managment of Chronic Issues    HPI:  72 year old female who is a long term [facility] of [name] is being seen today for routine follow up.  Pt without any complaints at this time and staff has no concerns Reassessment of ongoing issues  ASTHMA - no increase in shortness of breath; stable on advair 250/50 twice daily and duoneb every 6 hours as needed   GERD-Stable on prilosec 20 mg daily   Anxiety state, unspecified- does well on lexapro 10 mg daily and xanax 0.25 mg twice daily as needed   Psychosis Stable still has occasional episodes; on Risperdal 0.75 mg nightly   Hypokalemia currently on potassium 40 meq daily   Low back pain Stable on 2 lidoderm patches to her lower back and vicodin 5/325 mg every 4 hours as needed for pain   Review of Systems:  Review of Systems  Constitutional: Negative for fever, chills and malaise/fatigue.  HENT: Negative for congestion and sore throat.   Respiratory: Negative for cough and shortness of breath.   Cardiovascular: Negative for chest pain, palpitations and leg swelling.  Gastrointestinal: Negative for heartburn, abdominal pain, diarrhea and constipation.  Genitourinary: Negative for dysuria,  urgency and frequency.  Musculoskeletal: Negative for joint pain and myalgias.  Skin: Negative.   Neurological: Negative for dizziness, weakness and headaches.  Psychiatric/Behavioral: Positive for memory loss. Negative for depression. The patient is not nervous/anxious.     Past Medical History  Diagnosis Date  . Depressive disorder, not elsewhere classified   . Chronic airway obstruction, not elsewhere classified   . Spinal stenosis   . Lumbago   . Esophageal reflux   . Anxiety state, unspecified   . Abnormality of gait   . Candidiasis of other urogenital sites    Past Surgical History  Procedure Laterality Date  . Cholecystectomy    . Appendectomy    . Abdominal hysterectomy    . Vertebroplasty     Social History:   reports that she has quit smoking. She does not have any smokeless tobacco history on file. She reports that she does not drink alcohol or use illicit drugs.  No family history on file.  Medications: Patient's Medications  New Prescriptions   No medications on file  Previous Medications   ACETAMINOPHEN (TYLENOL) 325 MG TABLET    Take 325 mg by mouth every 4 (four) hours as needed for pain.   ALPRAZOLAM (XANAX) 0.25 MG TABLET    Take 1 tablet (0.25 mg total) by mouth 2 (two) times daily as needed.   BENZOCAINE-RESORCINOL (VAGICREAM) 5-2 % VAGINAL CREAM    Place 1  application vaginally 2 (two) times daily as needed for itching (and/or burning).   CHOLECALCIFEROL (VITAMIN D) 2000 UNITS CAPS    Take 1 capsule by mouth daily.   DIPHENHYDRAMINE (SOMINEX) 25 MG TABLET    Take 25 mg by mouth every 6 (six) hours as needed for itching.    DONEPEZIL (ARICEPT) 10 MG TABLET    Take 1 tablet (10 mg total) by mouth at bedtime as needed.   DULOXETINE (CYMBALTA) 60 MG CAPSULE    Take 60 mg by mouth daily.   ESCITALOPRAM (LEXAPRO) 10 MG TABLET    Take 10 mg by mouth daily.    FLUTICASONE-SALMETEROL (ADVAIR) 250-50 MCG/DOSE AEPB    Inhale 1 puff into the lungs every 12 (twelve)  hours.   HYDROCODONE-ACETAMINOPHEN (NORCO/VICODIN) 5-325 MG PER TABLET    Take 1 tablet by mouth every 4 (four) hours as needed for pain.    IPRATROPIUM-ALBUTEROL (DUONEB) 0.5-2.5 (3) MG/3ML SOLN    Take 3 mLs by nebulization every 6 (six) hours as needed (shortness of breath).   LIDOCAINE (LIDODERM) 5 %    Place 2 patches onto the skin every morning. Apply two patches to lower back. Remove & Discard patch after 12 hours.   NYSTATIN (MYCOSTATIN/NYSTOP) 100000 UNIT/GM POWD    Apply 1 g topically daily. Apply to reddened skin folds daily until yeast infection resolved.   OMEPRAZOLE (PRILOSEC) 20 MG CAPSULE    Take 20 mg by mouth daily.   POTASSIUM CHLORIDE SA (K-DUR,KLOR-CON) 20 MEQ TABLET    Take 40 mEq by mouth daily.   PROMETHAZINE (PHENERGAN) 25 MG TABLET    Take 25 mg by mouth every 6 (six) hours as needed for nausea.   RISPERIDONE (RISPERDAL) 0.25 MG TABLET    Take 0.75 mg by mouth at bedtime.    TRAMADOL (ULTRAM) 50 MG TABLET    Take 100 mg by mouth every 6 (six) hours as needed for pain.  Modified Medications   No medications on file  Discontinued Medications   No medications on file     Physical Exam: Physical Exam  Constitutional: She is oriented to person, place, and time and well-developed, well-nourished, and in no distress.  HENT: unremarkable  Neck: Normal range of motion. Neck supple. No thyromegaly present.  Cardiovascular: Normal rate and regular rhythm.  Murmur heard.  Pulmonary/Chest: Effort normal and breath sounds normal. No respiratory distress.  Abdominal: Soft. Bowel sounds are normal. She exhibits no distension. There is no tenderness.  Musculoskeletal: She exhibits no edema and no tenderness.  Neurological: She is alert and oriented to person, place, and time.  Skin: Skin is warm and dry. She is not diaphoretic.  Psychiatric: Affect normal.   Filed Vitals:   07/10/13 1617  BP: 96/62  Pulse: 74  Temp: 97.4 F (36.3 C)  Resp: 20      Labs  reviewed: 03-21-12: wbc 11.0; hgb 12.0; hct 37.6; mcv 90.6; plt 340; glucose 102; bun 11; creat 0.46; k+ 4.2; na++ 141  liver normal albumin 3.6; vit d 28  06-27-12: hgb a1c 6.9  09-07-12: wbc 11.;3 hgb 11.2; hct 34.5; mcv 85.2; plt 349; chol 173; ldl 105; trig 147; hgb a1c 7.5  11-28-12 TSH 2.3  hgb A1C 7.9  Lipids- total cholesterol 172, triglyceride 196, HDL 39, LDL94  Sodium 141, potassium 4.1, glucose 116, BUN 11, Cr 0.58  Wbc 8.8, rbc 4.2, hgb 11.3, hct 34.7 Hemoglobin A1C    Result: 02/13/2013 6:07 PM   ( Status: F )  C Hemoglobin A1C 7.2   H <5.7 % SLN C Estimated Average Glucose 160  Assessment/Plan 1. G E R D -stable on current medications   2. Vitamin D deficiency -currently on vit d 2000 units daily; will follow up vit d level  3. Low back pain Stable on current medications  4. Psychosis -improved on Risperdal but still has episodes of increase agitation   5. ASTHMA -stable on current regimen   6. Dementia with behavioral disturbance -conts on Aricept; will add namenda titration at this time to namenda 28 mg XR once daily    7. Elevated glucose Currently getting CBGs ACHS and on no medications for diabetes; staff reports this makes her agitated at times; will stop cbgs at this time and will get A1c  8. Anemia Will follow up cbc

## 2013-07-25 ENCOUNTER — Non-Acute Institutional Stay (SKILLED_NURSING_FACILITY): Payer: Medicare Other | Admitting: Internal Medicine

## 2013-07-25 ENCOUNTER — Encounter: Payer: Self-pay | Admitting: Internal Medicine

## 2013-07-25 DIAGNOSIS — R05 Cough: Secondary | ICD-10-CM | POA: Diagnosis not present

## 2013-07-25 DIAGNOSIS — J45909 Unspecified asthma, uncomplicated: Secondary | ICD-10-CM

## 2013-07-25 DIAGNOSIS — E559 Vitamin D deficiency, unspecified: Secondary | ICD-10-CM

## 2013-07-25 DIAGNOSIS — E1149 Type 2 diabetes mellitus with other diabetic neurological complication: Secondary | ICD-10-CM | POA: Diagnosis not present

## 2013-07-25 DIAGNOSIS — R059 Cough, unspecified: Secondary | ICD-10-CM

## 2013-07-25 NOTE — Progress Notes (Signed)
Patient ID: [name] [name], female   DOB: 02/23/1941, 72 y.o.   MRN: [id number]  Location:  [facility] Provider:  Gwenith Spitz. Renato Gails, D.O., C.M.D.  Code Status:  DNR  Chief Complaint  Patient presents with  . Acute Visit    cough, congestion    HPI:  72 yo female with h/o DMII, depression, psychosis, anxiety here for long term care.  She has been coughing all day.  Staff have noticed increased congestion.  She denies all symptoms saying she feels just fine.  She has no chills, myalgias, nausea, diarrhea.  She has a low grade temp  Review of Systems:  Review of Systems  Constitutional: Positive for fever. Negative for chills and malaise/fatigue.  HENT: Negative for congestion.   Respiratory: Positive for cough. Negative for shortness of breath.   Cardiovascular: Negative for chest pain.  Gastrointestinal: Negative for nausea, vomiting, abdominal pain and diarrhea.  Musculoskeletal: Negative for myalgias.  Neurological: Negative for weakness.  Psychiatric/Behavioral: Positive for depression and memory loss.    Medications: Patient's Medications  New Prescriptions   No medications on file  Previous Medications   ACETAMINOPHEN (TYLENOL) 325 MG TABLET    Take 325 mg by mouth every 4 (four) hours as needed for pain.   ALPRAZOLAM (XANAX) 0.25 MG TABLET    Take 1 tablet (0.25 mg total) by mouth 2 (two) times daily as needed.   BENZOCAINE-RESORCINOL (VAGICREAM) 5-2 % VAGINAL CREAM    Place 1 application vaginally 2 (two) times daily as needed for itching (and/or burning).   CHOLECALCIFEROL (VITAMIN D) 2000 UNITS CAPS    Take 1 capsule by mouth daily.   DIPHENHYDRAMINE (SOMINEX) 25 MG TABLET    Take 25 mg by mouth every 6 (six) hours as needed for itching.    DONEPEZIL (ARICEPT) 10 MG TABLET    Take 1 tablet (10 mg total) by mouth at bedtime as needed.   DULOXETINE (CYMBALTA) 60 MG CAPSULE    Take 60 mg by mouth daily.   ESCITALOPRAM (LEXAPRO) 10 MG TABLET    Take 10 mg by  mouth daily.    FLUTICASONE-SALMETEROL (ADVAIR) 250-50 MCG/DOSE AEPB    Inhale 1 puff into the lungs every 12 (twelve) hours.   HYDROCODONE-ACETAMINOPHEN (NORCO/VICODIN) 5-325 MG PER TABLET    Take 1 tablet by mouth every 4 (four) hours as needed for pain.    IPRATROPIUM-ALBUTEROL (DUONEB) 0.5-2.5 (3) MG/3ML SOLN    Take 3 mLs by nebulization every 6 (six) hours as needed (shortness of breath).   LIDOCAINE (LIDODERM) 5 %    Place 2 patches onto the skin every morning. Apply two patches to lower back. Remove & Discard patch after 12 hours.   NYSTATIN (MYCOSTATIN/NYSTOP) 100000 UNIT/GM POWD    Apply 1 g topically daily. Apply to reddened skin folds daily until yeast infection resolved.   OMEPRAZOLE (PRILOSEC) 20 MG CAPSULE    Take 20 mg by mouth daily.   POTASSIUM CHLORIDE SA (K-DUR,KLOR-CON) 20 MEQ TABLET    Take 40 mEq by mouth daily.   PROMETHAZINE (PHENERGAN) 25 MG TABLET    Take 25 mg by mouth every 6 (six) hours as needed for nausea.   RISPERIDONE (RISPERDAL) 0.25 MG TABLET    Take 0.75 mg by mouth at bedtime.    TRAMADOL (ULTRAM) 50 MG TABLET    Take 100 mg by mouth every 6 (six) hours as needed for pain.  Modified Medications   No medications on file  Discontinued Medications  No medications on file    Physical Exam: Filed Vitals:   07/25/13 1905  BP: 111/84  Pulse: 87  Temp: 99.7 F (37.6 C)  Resp: 18   Physical Exam  Constitutional: She appears well-developed and well-nourished. No distress.  Neck: Neck supple.  Cardiovascular: Normal rate, regular rhythm, normal heart sounds and intact distal pulses.   Pulmonary/Chest: Effort normal and breath sounds normal. No respiratory distress.  Coughing, dry cough  Abdominal: Soft. Bowel sounds are normal. She exhibits no distension. There is no tenderness.  Musculoskeletal: Normal range of motion.  Lymphadenopathy:    She has no cervical adenopathy.  Neurological: She is alert.  Skin: Skin is warm and dry.   Labs  reviewed: 07/14/13:  Vit D 30, hba1c 7.4, Na 142, K 4.2, BUN 11, cr 0.71, wbc 9.2, h/h 12.3/40.1, plts 314 Assessment/Plan  1. Cough -seems benign at this point -lungs clear, has mildly elevated temp so staff will monitor her closely  2. Type II or unspecified type diabetes mellitus with neurological manifestations, not stated as uncontrolled(250.60) -last hba1c <8 with diet  -no changes needed  3. Vitamin D deficiency -vit D level 30 which is fair, cont supplement  4. Unspecified asthma(493.90) -monitor for wheezing, fever, decline due to this history and increased dry cough

## 2013-09-21 ENCOUNTER — Other Ambulatory Visit: Payer: Self-pay | Admitting: *Deleted

## 2013-09-21 MED ORDER — TRAMADOL HCL 50 MG PO TABS: ORAL_TABLET | ORAL | Status: DC

## 2013-09-21 NOTE — Telephone Encounter (Signed)
[facility] [place] 

## 2013-10-04 ENCOUNTER — Encounter: Payer: Self-pay | Admitting: Internal Medicine

## 2013-10-04 ENCOUNTER — Non-Acute Institutional Stay (SKILLED_NURSING_FACILITY): Payer: Medicare Other | Admitting: Internal Medicine

## 2013-10-04 DIAGNOSIS — E559 Vitamin D deficiency, unspecified: Secondary | ICD-10-CM

## 2013-10-04 DIAGNOSIS — F03918 Unspecified dementia, unspecified severity, with other behavioral disturbance: Secondary | ICD-10-CM

## 2013-10-04 DIAGNOSIS — K219 Gastro-esophageal reflux disease without esophagitis: Secondary | ICD-10-CM

## 2013-10-04 DIAGNOSIS — E119 Type 2 diabetes mellitus without complications: Secondary | ICD-10-CM | POA: Diagnosis not present

## 2013-10-04 DIAGNOSIS — F0391 Unspecified dementia with behavioral disturbance: Secondary | ICD-10-CM | POA: Diagnosis not present

## 2013-10-04 DIAGNOSIS — Z794 Long term (current) use of insulin: Secondary | ICD-10-CM

## 2013-10-04 DIAGNOSIS — E118 Type 2 diabetes mellitus with unspecified complications: Secondary | ICD-10-CM | POA: Insufficient documentation

## 2013-10-04 DIAGNOSIS — J45909 Unspecified asthma, uncomplicated: Secondary | ICD-10-CM | POA: Diagnosis not present

## 2013-10-04 NOTE — Assessment & Plan Note (Signed)
-  Controlled, continue Omeprazole daily.

## 2013-10-04 NOTE — Assessment & Plan Note (Signed)
-  Continue on Vitamin D supplementation.

## 2013-10-04 NOTE — Assessment & Plan Note (Signed)
-  Controlled, continue on Advair BID, and Duonebs as needed

## 2013-10-04 NOTE — Assessment & Plan Note (Addendum)
-  Controlled, last HgbA1c 7.3 on 07/11/13.  -Diet controlled, continue

## 2013-10-04 NOTE — Progress Notes (Signed)
Patient ID: [name] [name], female   DOB: 31-Aug-1940, 73 y.o.   MRN: [id number]  Provider:  Rexene Edison. Mariea Clonts, D.O., C.M.D.  Location:  [facility]  PCP: Hollace Kinnier, DO  Code Status: DNR  Allergies  Allergen Reactions  . Avelox [Moxifloxacin Hcl In Nacl] Hives  . Codeine     Reports her throat swelled in the past but has tolerated since.  . Compazine [Prochlorperazine Edisylate] Nausea And Vomiting  . Morphine And Related     Patient doesn't recall  . Penicillins     Patient doesn't recall.  . Phenothiazines     Patient doesn't recall  . Prednisone     Insomnia, confusion.  Marland Kitchen Reglan [Metoclopramide]     Patient doesn't recall "but it was awful"  . Sulfonamide Derivatives Hives    Chief Complaint  Patient presents with  . Medical Managment of Chronic Issues    HPI: 73 y.o. female With a history of Dementia with behavioral disturbance, psychosis, DM II, Vitamin D deficiency, GERD, and low back pain who is being seen today for a routine follow-up of chronic issues.  Pleasantly confused today, cooperative with exam. Denies any pain, complaints, or needs today.     ROS: Review of Systems  Constitutional: Negative for chills and malaise/fatigue.  HENT: Negative for congestion.   Respiratory: Negative for cough, shortness of breath and wheezing.   Cardiovascular: Negative for chest pain, palpitations and leg swelling.  Gastrointestinal: Negative for heartburn, nausea, vomiting, abdominal pain, diarrhea and constipation.  Genitourinary: Negative for dysuria.  Musculoskeletal: Negative for myalgias.  Skin: Negative for itching and rash.  Neurological: Negative for dizziness, focal weakness, weakness and headaches.  Psychiatric/Behavioral: Negative for depression. The patient is not nervous/anxious and does not have insomnia.     Past Medical History  Diagnosis Date  . Depressive disorder, not elsewhere classified   . Chronic airway obstruction, not elsewhere  classified   . Spinal stenosis   . Lumbago   . Esophageal reflux   . Anxiety state, unspecified   . Abnormality of gait   . Candidiasis of other urogenital sites    Past Surgical History  Procedure Laterality Date  . Cholecystectomy    . Appendectomy    . Abdominal hysterectomy    . Vertebroplasty     Social History:   reports that she has quit smoking. She does not have any smokeless tobacco history on file. She reports that she does not drink alcohol or use illicit drugs.  No family history on file.  Medications: Patient's Medications  New Prescriptions   No medications on file  Previous Medications   ACETAMINOPHEN (TYLENOL) 325 MG TABLET    Take 325 mg by mouth every 4 (four) hours as needed for pain.   ALPRAZOLAM (XANAX) 0.25 MG TABLET    Take 1 tablet (0.25 mg total) by mouth 2 (two) times daily as needed.   BENZOCAINE-RESORCINOL (VAGICREAM) 5-2 % VAGINAL CREAM    Place 1 application vaginally 2 (two) times daily as needed for itching (and/or burning).   CHOLECALCIFEROL (VITAMIN D) 2000 UNITS CAPS    Take 1 capsule by mouth daily.   DIPHENHYDRAMINE (SOMINEX) 25 MG TABLET    Take 25 mg by mouth every 6 (six) hours as needed for itching.    DONEPEZIL (ARICEPT) 10 MG TABLET    Take 1 tablet (10 mg total) by mouth at bedtime as needed.   DULOXETINE (CYMBALTA) 60 MG CAPSULE    Take 60 mg by  mouth daily.   ESCITALOPRAM (LEXAPRO) 10 MG TABLET    Take 10 mg by mouth daily.    FLUTICASONE-SALMETEROL (ADVAIR) 250-50 MCG/DOSE AEPB    Inhale 1 puff into the lungs every 12 (twelve) hours.   HYDROCODONE-ACETAMINOPHEN (NORCO/VICODIN) 5-325 MG PER TABLET    Take 1 tablet by mouth every 4 (four) hours as needed for pain.    IPRATROPIUM-ALBUTEROL (DUONEB) 0.5-2.5 (3) MG/3ML SOLN    Take 3 mLs by nebulization every 6 (six) hours as needed (shortness of breath).   LIDOCAINE (LIDODERM) 5 %    Place 2 patches onto the skin every morning. Apply two patches to lower back. Remove & Discard patch  after 12 hours.   NYSTATIN (MYCOSTATIN/NYSTOP) 100000 UNIT/GM POWD    Apply 1 g topically daily. Apply to reddened skin folds daily until yeast infection resolved.   OMEPRAZOLE (PRILOSEC) 20 MG CAPSULE    Take 20 mg by mouth daily.   POTASSIUM CHLORIDE SA (K-DUR,KLOR-CON) 20 MEQ TABLET    Take 40 mEq by mouth daily.   PROMETHAZINE (PHENERGAN) 25 MG TABLET    Take 25 mg by mouth every 6 (six) hours as needed for nausea.   RISPERIDONE (RISPERDAL) 0.25 MG TABLET    Take 0.75 mg by mouth at bedtime.    TRAMADOL (ULTRAM) 50 MG TABLET    Take two tablets by mouth every 6 hours as needed for pain  Modified Medications   No medications on file  Discontinued Medications   No medications on file     Physical Exam: Filed Vitals:   10/04/13 1318  BP: 120/58  Pulse: 60  Resp: 18   Physical Exam  Nursing note and vitals reviewed. Constitutional: She appears well-developed and well-nourished. No distress.  HENT:  Head: Normocephalic.  Eyes: Pupils are equal, round, and reactive to light.  Neck: Normal range of motion.  Cardiovascular: Normal rate, regular rhythm, normal heart sounds and intact distal pulses.  Exam reveals no gallop and no friction rub.   No murmur heard. Pulmonary/Chest: Effort normal and breath sounds normal. No respiratory distress. She has no wheezes.  Abdominal: Soft. Bowel sounds are normal. She exhibits no distension and no mass. There is no tenderness. There is no guarding.  Musculoskeletal: She exhibits no edema and no tenderness.  Neurological: She is alert.  Pleasantly confused.   Skin: Skin is warm and dry. She is not diaphoretic. No erythema. No pallor.  Psychiatric: She has a normal mood and affect. Her behavior is normal.     Labs reviewed: 07/27/13 Influenza negative hba1c 07/11/13 7.3  Assessment/Plan ASTHMA -Controlled, continue on Advair BID, and Duonebs as needed  Dementia with behavioral disturbance -Stable- pleasantly confused today and  cooperative. -Continue on Cymbalta, Aricept, Namenda, and Risperdal.  -Xanax prn acute anxiety.  Vitamin D deficiency -Continue on Vitamin D supplementation.   G E R D -Controlled, continue Omeprazole daily.   Type II or unspecified type diabetes mellitus without mention of complication, not stated as uncontrolled -Controlled, last HgbA1c 7.3 on 07/11/13.  -Diet controlled, continue   Functional status: requires assistance with bathing, dressing, grooming due to dementia  Family/ staff Communication: No staff concerns today.  Labs/tests ordered:  Will f/u labs in 3 mos unless something comes up

## 2013-10-04 NOTE — Assessment & Plan Note (Addendum)
-  Stable- pleasantly confused today and cooperative. -Continue on Cymbalta, Aricept, Namenda, and Risperdal.  -Xanax prn acute anxiety.

## 2013-11-06 ENCOUNTER — Non-Acute Institutional Stay (SKILLED_NURSING_FACILITY): Payer: Medicare Other | Admitting: Nurse Practitioner

## 2013-11-06 DIAGNOSIS — E559 Vitamin D deficiency, unspecified: Secondary | ICD-10-CM

## 2013-11-06 DIAGNOSIS — E119 Type 2 diabetes mellitus without complications: Secondary | ICD-10-CM

## 2013-11-06 DIAGNOSIS — M545 Low back pain, unspecified: Secondary | ICD-10-CM

## 2013-11-06 DIAGNOSIS — F29 Unspecified psychosis not due to a substance or known physiological condition: Secondary | ICD-10-CM

## 2013-11-06 DIAGNOSIS — F03918 Unspecified dementia, unspecified severity, with other behavioral disturbance: Secondary | ICD-10-CM | POA: Diagnosis not present

## 2013-11-06 DIAGNOSIS — F0391 Unspecified dementia with behavioral disturbance: Secondary | ICD-10-CM

## 2013-11-06 DIAGNOSIS — K219 Gastro-esophageal reflux disease without esophagitis: Secondary | ICD-10-CM

## 2013-11-06 NOTE — Progress Notes (Signed)
Patient ID: [name] [name], female   DOB: 1941/01/10, 73 y.o.   MRN: [id number]    Nursing Home Location:  [place] of Service: SNF (31)  PCP: REED, TIFFANY, DO  Allergies  Allergen Reactions  . Avelox [Moxifloxacin Hcl In Nacl] Hives  . Codeine     Reports her throat swelled in the past but has tolerated since.  . Compazine [Prochlorperazine Edisylate] Nausea And Vomiting  . Morphine And Related     Patient doesn't recall  . Penicillins     Patient doesn't recall.  . Phenothiazines     Patient doesn't recall  . Prednisone     Insomnia, confusion.  Marland Kitchen Reglan [Metoclopramide]     Patient doesn't recall "but it was awful"  . Sulfonamide Derivatives Hives    Chief Complaint  Patient presents with  . Medical Managment of Chronic Issues    HPI:  73  year old female who is a long term resident of [facility] is being seen today for routine follow up. Pt with a pmh of asthma, GERD, anxiety, psychosis, hypokalemia on potassium supplements and low back pain. Pt reports she does not feel well but no real complaints and ROS is neg; denies pain at this time; staff reports pt is without behaviors and has been doing well in the last month.   Review of Systems:  Review of Systems  Constitutional: Negative for chills and malaise/fatigue.  HENT: Negative for congestion.   Respiratory: Negative for cough, shortness of breath and wheezing.   Cardiovascular: Negative for chest pain, palpitations and leg swelling.  Gastrointestinal: Negative for heartburn, nausea, vomiting, abdominal pain, diarrhea and constipation.  Genitourinary: Negative for dysuria.  Musculoskeletal: Negative for myalgias.  Skin: Negative for itching and rash.  Neurological: Negative for dizziness, focal weakness, weakness and headaches.  Psychiatric/Behavioral: Negative for depression. The patient is not nervous/anxious and does not have insomnia.      Past Medical History  Diagnosis Date    . Depressive disorder, not elsewhere classified   . Chronic airway obstruction, not elsewhere classified   . Spinal stenosis   . Lumbago   . Esophageal reflux   . Anxiety state, unspecified   . Abnormality of gait   . Candidiasis of other urogenital sites    Past Surgical History  Procedure Laterality Date  . Cholecystectomy    . Appendectomy    . Abdominal hysterectomy    . Vertebroplasty     Social History:   reports that she has quit smoking. She does not have any smokeless tobacco history on file. She reports that she does not drink alcohol or use illicit drugs.  No family history on file.  Medications: Patient's Medications  New Prescriptions   No medications on file  Previous Medications   ACETAMINOPHEN (TYLENOL) 325 MG TABLET    Take 325 mg by mouth every 4 (four) hours as needed for pain.   ALPRAZOLAM (XANAX) 0.25 MG TABLET    Take 1 tablet (0.25 mg total) by mouth 2 (two) times daily as needed.   BENZOCAINE-RESORCINOL (VAGICREAM) 5-2 % VAGINAL CREAM    Place 1 application vaginally 2 (two) times daily as needed for itching (and/or burning).   CHOLECALCIFEROL (VITAMIN D) 2000 UNITS CAPS    Take 1 capsule by mouth daily.   DIPHENHYDRAMINE (SOMINEX) 25 MG TABLET    Take 25 mg by mouth every 6 (six) hours as needed for itching.    DONEPEZIL (ARICEPT) 10 MG TABLET  Take 1 tablet (10 mg total) by mouth at bedtime as needed.   DULOXETINE (CYMBALTA) 60 MG CAPSULE    Take 60 mg by mouth daily.       FLUTICASONE-SALMETEROL (ADVAIR) 250-50 MCG/DOSE AEPB    Inhale 1 puff into the lungs every 12 (twelve) hours.   HYDROCODONE-ACETAMINOPHEN (NORCO/VICODIN) 5-325 MG PER TABLET    Take 1 tablet by mouth every 4 (four) hours as needed for pain.    IPRATROPIUM-ALBUTEROL (DUONEB) 0.5-2.5 (3) MG/3ML SOLN    Take 3 mLs by nebulization every 6 (six) hours as needed (shortness of breath).   LIDOCAINE (LIDODERM) 5 %    Place 2 patches onto the skin every morning. Apply two patches to lower  back. Remove & Discard patch after 12 hours.   NYSTATIN (MYCOSTATIN/NYSTOP) 100000 UNIT/GM POWD    Apply 1 g topically daily. Apply to reddened skin folds daily until yeast infection resolved.   OMEPRAZOLE (PRILOSEC) 20 MG CAPSULE    Take 20 mg by mouth daily.   POTASSIUM CHLORIDE SA (K-DUR,KLOR-CON) 20 MEQ TABLET    Take 40 mEq by mouth daily.   PROMETHAZINE (PHENERGAN) 25 MG TABLET    Take 25 mg by mouth every 6 (six) hours as needed for nausea.   RISPERIDONE (RISPERDAL) 0.25 MG TABLET    Take 0.75 mg by mouth at bedtime.    TRAMADOL (ULTRAM) 50 MG TABLET    Take two tablets by mouth every 6 hours as needed for pain  Modified Medications   No medications on file  Discontinued Medications   No medications on file     Physical Exam:  Filed Vitals:   11/06/13 1434  BP: 130/72  Pulse: 62  Temp: 97.1 F (36.2 C)  Resp: 20    Physical Exam  Constitutional: She is oriented to person, place, and time and well-developed, well-nourished, and in no distress. No distress.  HENT:  Head: Normocephalic and atraumatic.  Mouth/Throat: Oropharynx is clear and moist. No oropharyngeal exudate.  Eyes: Conjunctivae and EOM are normal. Pupils are equal, round, and reactive to light.  Neck: Normal range of motion. Neck supple. No thyromegaly present.  Cardiovascular: Normal rate and regular rhythm.   Murmur heard. Pulmonary/Chest: Effort normal and breath sounds normal. No respiratory distress.  Abdominal: Soft. Bowel sounds are normal. She exhibits no distension. There is no tenderness.  Musculoskeletal: She exhibits no edema and no tenderness.  Neurological: She is alert and oriented to person, place, and time.  Skin: Skin is warm and dry. She is not diaphoretic.  Psychiatric: Affect normal.     Labs reviewed: 03-21-12: wbc 11.0; hgb 12.0; hct 37.6; mcv 90.6; plt 340; glucose 102; bun 11; creat 0.46; k+ 4.2; na++ 141  liver normal albumin 3.6; vit d 28  06-27-12: hgb a1c 6.9  09-07-12: wbc  11.;3 hgb 11.2; hct 34.5; mcv 85.2; plt 349; chol 173; ldl 105; trig 147; hgb a1c 7.5  11-28-12 TSH 2.3  hgb A1C 7.9  Lipids- total cholesterol 172, triglyceride 196, HDL 39, LDL94  Sodium 141, potassium 4.1, glucose 116, BUN 11, Cr 0.58  Wbc 8.8, rbc 4.2, hgb 11.3, hct 34.7  Hemoglobin A1C  Result: 02/13/2013 6:07 PM ( Status: F ) C  Hemoglobin A1C 7.2 H <5.7 % SLN C  Estimated Average Glucose 160 07/2013 A1c 7.4, Vit D 30, glucose 112, BUN 11, Cr 0.71, potassium 4.2, sodium  142 hgb 12.5, hct 40.1, plt 314   Assessment/Plan 1. G E R D -without symptoms  on omeprazole 20 mg   2. Type II or unspecified type diabetes mellitus without mention of complication, not stated as uncontrolled -diet controlled, will follow up A1c with next labs  3. Dementia with behavioral disturbance -has been stable without significant decline conts on aricept   4. Vitamin D deficiency -VIt D borderline on last labs will follow up Vit D on next labs   5. Low back pain -without complaints, conts on pain medication and lidocaine patch  6. Psychosis -previous with frequent vaginal complaints and behaviors, now this has decrease and pt has been stable, will cont risperdal   7. Anxiety -well controlled on  cymbalta

## 2014-02-14 ENCOUNTER — Encounter: Payer: Self-pay | Admitting: Internal Medicine

## 2014-02-14 ENCOUNTER — Non-Acute Institutional Stay (SKILLED_NURSING_FACILITY): Payer: Medicare Other | Admitting: Internal Medicine

## 2014-02-14 DIAGNOSIS — F22 Delusional disorders: Secondary | ICD-10-CM

## 2014-02-14 DIAGNOSIS — M545 Low back pain, unspecified: Secondary | ICD-10-CM

## 2014-02-14 DIAGNOSIS — F0152 Vascular dementia, unspecified severity, with psychotic disturbance: Secondary | ICD-10-CM

## 2014-02-14 DIAGNOSIS — F0150 Vascular dementia without behavioral disturbance: Secondary | ICD-10-CM

## 2014-02-14 DIAGNOSIS — E559 Vitamin D deficiency, unspecified: Secondary | ICD-10-CM

## 2014-02-14 DIAGNOSIS — F29 Unspecified psychosis not due to a substance or known physiological condition: Secondary | ICD-10-CM

## 2014-02-14 DIAGNOSIS — J45909 Unspecified asthma, uncomplicated: Secondary | ICD-10-CM

## 2014-02-14 DIAGNOSIS — E119 Type 2 diabetes mellitus without complications: Secondary | ICD-10-CM

## 2014-02-14 DIAGNOSIS — F0151 Vascular dementia with behavioral disturbance: Secondary | ICD-10-CM

## 2014-02-14 NOTE — Progress Notes (Signed)
Patient ID: [name] [name], female   DOB: 1940-08-23, 73 y.o.   MRN: [id number]  Location:  [facility] Provider:  Rexene Edison. Mariea Clonts, D.O., C.M.D.  Code Status:  DNR  Chief Complaint  Patient presents with  . Medical Management of Chronic Issues    HPI:  73 yo white female with dementia, psychosis, depression, vaginitis, COPD and chronic back pain seen for med mgt of chronic diseases.  Staff note no new concerns with her.  She keeps to herself.  When seen, she had no concerns whatsoever, says she is doing fine.    Review of Systems:  Review of Systems  Constitutional: Negative for fever.  HENT: Negative for congestion.   Eyes: Negative for blurred vision.  Respiratory: Negative for shortness of breath.   Cardiovascular: Negative for chest pain.  Gastrointestinal: Negative for abdominal pain.  Genitourinary: Negative for dysuria.  Musculoskeletal: Negative for falls and myalgias.  Skin: Negative for rash.  Neurological: Negative for dizziness, loss of consciousness and headaches.  Endo/Heme/Allergies:       Diabetes  Psychiatric/Behavioral: Positive for memory loss.    Medications: Patient's Medications  New Prescriptions   No medications on file  Previous Medications   ACETAMINOPHEN (TYLENOL) 325 MG TABLET    Take 325 mg by mouth every 4 (four) hours as needed for pain.   BENZOCAINE-RESORCINOL (VAGICREAM) 5-2 % VAGINAL CREAM    Place 1 application vaginally 2 (two) times daily as needed for itching (and/or burning).   CHOLECALCIFEROL (VITAMIN D) 2000 UNITS CAPS    Take 1 capsule by mouth daily.   DIPHENHYDRAMINE (SOMINEX) 25 MG TABLET    Take 25 mg by mouth every 6 (six) hours as needed for itching.    DONEPEZIL (ARICEPT) 10 MG TABLET    Take 1 tablet (10 mg total) by mouth at bedtime as needed.   DULOXETINE (CYMBALTA) 60 MG CAPSULE    Take 60 mg by mouth daily.   FLUTICASONE-SALMETEROL (ADVAIR) 250-50 MCG/DOSE AEPB    Inhale 1 puff into the lungs every 12  (twelve) hours.   HYDROCODONE-ACETAMINOPHEN (NORCO/VICODIN) 5-325 MG PER TABLET    Take 1 tablet by mouth every 4 (four) hours as needed for pain.    IPRATROPIUM-ALBUTEROL (DUONEB) 0.5-2.5 (3) MG/3ML SOLN    Take 3 mLs by nebulization every 6 (six) hours as needed (shortness of breath).   LIDOCAINE (LIDODERM) 5 %    Place 2 patches onto the skin every morning. Apply two patches to lower back. Remove & Discard patch after 12 hours.   MEMANTINE HCL ER (NAMENDA XR) 28 MG CP24    Take 28 mg by mouth daily.   NYSTATIN (MYCOSTATIN/NYSTOP) 100000 UNIT/GM POWD    Apply 1 g topically daily. Apply to reddened skin folds daily until yeast infection resolved.   POTASSIUM CHLORIDE SA (K-DUR,KLOR-CON) 20 MEQ TABLET    Take 40 mEq by mouth daily.   PROMETHAZINE (PHENERGAN) 25 MG TABLET    Take 25 mg by mouth every 6 (six) hours as needed for nausea.   RISPERIDONE (RISPERDAL) 0.25 MG TABLET    Take 0.75 mg by mouth at bedtime.    TRAMADOL (ULTRAM) 50 MG TABLET    Take two tablets by mouth every 6 hours as needed for pain  Modified Medications   No medications on file  Discontinued Medications   ALPRAZOLAM (XANAX) 0.25 MG TABLET    Take 1 tablet (0.25 mg total) by mouth 2 (two) times daily as needed.   OMEPRAZOLE (  PRILOSEC) 20 MG CAPSULE    Take 20 mg by mouth daily.    Physical Exam: Filed Vitals:   02/14/14 1436  BP: 122/67  Pulse: 71  Temp: 98.2 F (36.8 C)  Resp: 18  Height: 5\' 1"  (1.549 m)  Weight: 189 lb (85.73 kg)  SpO2: 97%  Physical Exam  Constitutional: She appears well-developed and well-nourished. No distress.  Obese abdomen  HENT:  Head: Normocephalic and atraumatic.  Cardiovascular: Normal rate, regular rhythm, normal heart sounds and intact distal pulses.   Pulmonary/Chest: Effort normal and breath sounds normal.  Abdominal: Soft. Bowel sounds are normal. She exhibits no distension and no mass. There is no tenderness.  Musculoskeletal: Normal range of motion. She exhibits no edema  and no tenderness.  Neurological: She is alert.  Oriented to person and place, not time  Skin: Skin is warm and dry.  Psychiatric: She has a normal mood and affect.    Labs reviewed: 01/08/14:  hba1c  7, vit D 31.7, glucose 104, BUN 14.9, cr 0.6, Na 141, K 3.9, alb 3.8, wbc 9.9, h/h 13.4/42.7, plt 322  Assessment/Plan 1. Type II or unspecified type diabetes mellitus without mention of complication, not stated as uncontrolled -diet controlled as of last eval -next due for hba1c in sept  2. Psychosis, unspecified psychosis type --continues on risperdal for this, seems it is related to depression  3. Vascular dementia with paranoia -cont aricept and namenda  4. Midline low back pain without sciatica -denies any current pain with use of cymbalta, tylenol, as needed tramadol or norco--may d/c one of these next time  5. Vitamin D deficiency -cont vitamin d repletion  6. Unspecified asthma(493.90) -cont advair, prn albuterol  Family/ staff Communication: discussed with nursing staff  Goals of care: long term care resident, DNR  Labs/tests ordered:  Hba1c, bmp, FLP in 2 mos

## 2014-04-25 LAB — LIPID PANEL
CHOLESTEROL: 195 mg/dL (ref 0–200)
HDL: 53 mg/dL (ref 35–70)
LDL CALC: 124 mg/dL
Triglycerides: 89 mg/dL (ref 40–160)

## 2014-04-25 LAB — BASIC METABOLIC PANEL
BUN: 14 mg/dL (ref 4–21)
Creatinine: 0.6 mg/dL (ref 0.5–1.1)
GLUCOSE: 129 mg/dL
Potassium: 4.2 mmol/L (ref 3.4–5.3)
SODIUM: 142 mmol/L (ref 137–147)

## 2014-04-25 LAB — HEMOGLOBIN A1C: Hgb A1c MFr Bld: 6.7 % — AB (ref 4.0–6.0)

## 2014-05-30 ENCOUNTER — Non-Acute Institutional Stay (SKILLED_NURSING_FACILITY): Payer: Medicare Other | Admitting: Internal Medicine

## 2014-05-30 ENCOUNTER — Encounter: Payer: Self-pay | Admitting: Internal Medicine

## 2014-05-30 DIAGNOSIS — F29 Unspecified psychosis not due to a substance or known physiological condition: Secondary | ICD-10-CM

## 2014-05-30 DIAGNOSIS — F0150 Vascular dementia without behavioral disturbance: Secondary | ICD-10-CM

## 2014-05-30 DIAGNOSIS — F22 Delusional disorders: Secondary | ICD-10-CM

## 2014-05-30 DIAGNOSIS — K219 Gastro-esophageal reflux disease without esophagitis: Secondary | ICD-10-CM

## 2014-05-30 DIAGNOSIS — J449 Chronic obstructive pulmonary disease, unspecified: Secondary | ICD-10-CM | POA: Diagnosis not present

## 2014-05-30 DIAGNOSIS — E785 Hyperlipidemia, unspecified: Secondary | ICD-10-CM

## 2014-05-30 DIAGNOSIS — M545 Low back pain, unspecified: Secondary | ICD-10-CM

## 2014-05-30 DIAGNOSIS — F0151 Vascular dementia with behavioral disturbance: Secondary | ICD-10-CM | POA: Diagnosis not present

## 2014-05-30 DIAGNOSIS — E119 Type 2 diabetes mellitus without complications: Secondary | ICD-10-CM | POA: Diagnosis not present

## 2014-05-30 DIAGNOSIS — E1169 Type 2 diabetes mellitus with other specified complication: Secondary | ICD-10-CM

## 2014-05-30 DIAGNOSIS — IMO0001 Reserved for inherently not codable concepts without codable children: Secondary | ICD-10-CM

## 2014-05-30 DIAGNOSIS — F0152 Vascular dementia, unspecified severity, with psychotic disturbance: Secondary | ICD-10-CM

## 2014-05-30 NOTE — Progress Notes (Signed)
Patient ID: [name] [name], female   DOB: May 31, 1941, 73 y.o.   MRN: [id number]  Location:  [facility] Provider:  Rexene Edison. Mariea Clonts, D.O., C.M.D.  Code Status:  DNR  Chief Complaint  Patient presents with  . Medical Management of Chronic Issues    HPI:  73 yo white female long term care resident seen for med mgt of chronic diseases.  She had no complaints.  See ROS.  She was resting in bed in the afternoon when I saw her.   Review of Systems:  Review of Systems  Constitutional: Negative for fever and malaise/fatigue.  Respiratory: Negative for shortness of breath.   Cardiovascular: Negative for chest pain and leg swelling.  Gastrointestinal: Negative for constipation, blood in stool and melena.  Genitourinary: Negative for dysuria.  Musculoskeletal: Negative for falls.  Neurological: Negative for dizziness, weakness and headaches.  Psychiatric/Behavioral: Positive for memory loss. Negative for depression.    Medications: Patient's Medications  New Prescriptions   No medications on file  Previous Medications   ACETAMINOPHEN (TYLENOL) 325 MG TABLET    Take 325 mg by mouth every 4 (four) hours as needed for pain.   BENZOCAINE-RESORCINOL (VAGICREAM) 5-2 % VAGINAL CREAM    Place 1 application vaginally 2 (two) times daily as needed for itching (and/or burning).   CHOLECALCIFEROL (VITAMIN D) 2000 UNITS CAPS    Take 1 capsule by mouth daily.   DIPHENHYDRAMINE (SOMINEX) 25 MG TABLET    Take 25 mg by mouth every 6 (six) hours as needed for itching.    DONEPEZIL (ARICEPT) 10 MG TABLET    Take 1 tablet (10 mg total) by mouth at bedtime as needed.   DULOXETINE (CYMBALTA) 60 MG CAPSULE    Take 60 mg by mouth daily.   FLUTICASONE-SALMETEROL (ADVAIR) 250-50 MCG/DOSE AEPB    Inhale 1 puff into the lungs every 12 (twelve) hours.   HYDROCODONE-ACETAMINOPHEN (NORCO/VICODIN) 5-325 MG PER TABLET    Take 1 tablet by mouth every 4 (four) hours as needed for pain.    IPRATROPIUM-ALBUTEROL (DUONEB) 0.5-2.5 (3) MG/3ML SOLN    Take 3 mLs by nebulization every 6 (six) hours as needed (shortness of breath).   LIDOCAINE (LIDODERM) 5 %    Place 2 patches onto the skin every morning. Apply two patches to lower back. Remove & Discard patch after 12 hours.   MEMANTINE HCL ER (NAMENDA XR) 28 MG CP24    Take 28 mg by mouth daily.   NYSTATIN (MYCOSTATIN/NYSTOP) 100000 UNIT/GM POWD    Apply 1 g topically daily. Apply to reddened skin folds daily until yeast infection resolved.   POTASSIUM CHLORIDE SA (K-DUR,KLOR-CON) 20 MEQ TABLET    Take 40 mEq by mouth daily.   PROMETHAZINE (PHENERGAN) 25 MG TABLET    Take 25 mg by mouth every 6 (six) hours as needed for nausea.   RISPERIDONE (RISPERDAL) 0.25 MG TABLET    Take 0.75 mg by mouth at bedtime.    TRAMADOL (ULTRAM) 50 MG TABLET    Take two tablets by mouth every 6 hours as needed for pain  Modified Medications   No medications on file  Discontinued Medications   No medications on file    Physical Exam: Filed Vitals:   05/30/14 1319  BP: 128/64  Pulse: 74  Temp: 98 F (36.7 C)  Resp: 18  Height: 5\' 1"  (1.549 m)  Weight: 185 lb (83.915 kg)  SpO2: 95%  Physical Exam  Constitutional: She appears well-developed and well-nourished. No  distress.  HENT:  Head: Normocephalic and atraumatic.  Cardiovascular: Normal rate, regular rhythm, normal heart sounds and intact distal pulses.   Pulmonary/Chest: Effort normal and breath sounds normal. No respiratory distress.  Abdominal: Soft. Bowel sounds are normal. She exhibits no distension and no mass. There is no tenderness.  Musculoskeletal: Normal range of motion. She exhibits no edema and no tenderness.  Neurological: She is alert.  Skin: Skin is warm and dry.  Psychiatric:  Fast speech when answering questions   Labs reviewed: And abstracted from July.  Assessment/Plan 1. Vascular dementia with paranoia -stable recently, spends more time in her room than she used  to -hygiene poor, but does not like a lot of help -wants to be left alone -continues on aricept and namenda XR  2. Psychosis, unspecified psychosis type -cont risperdal for this--is on lowest dose -also on cymbalta for her depression and low back pain -has chronic problems with vaginal irritation and itching that are helped with vagicream and the prn benadryl she has (these issues seem psychiatrically based)  3. COPD bronchitis -is former smoke, also has asthma in diagnoses -cont advair, duonebs prn  4. Midline low back pain without sciatica -no complaints today related to this, has prn tramadol and tylenol is on standing orders if needed, but has not done well with narcotics due to her psychosis and does best with the lidoderm patches for this--have benefited her for several years  -cannot use nsaids due to GI upset, risk of bleeding, and risk of renal failure at her age and with her diabetes  5. Diabetes mellitus type II, controlled, with no complications -not currently on any treatment for this -on concho diet, has abdominal obesity -pt always denies having diabetes, but hba1c is in that range -check repeat hba1c, urine microalbumin and bmp next in december  6. Hyperlipidemia associated with type 2 diabetes mellitus -continue pravachol especially with her diabetes and probable vascular etiology of her dementia -LDL was still 124 in July -would recheck in 6 mos  7. Gastroesophageal reflux disease without esophagitis -cont zantac for this, did not c/o it at her visit  Labs/tests ordered:  Cbc, bmp, hba1c, urine microalbumin due in Dec and plan on FLP at 6 mos

## 2014-06-03 ENCOUNTER — Encounter: Payer: Self-pay | Admitting: Internal Medicine

## 2014-06-03 DIAGNOSIS — J449 Chronic obstructive pulmonary disease, unspecified: Secondary | ICD-10-CM | POA: Insufficient documentation

## 2014-06-03 DIAGNOSIS — F0150 Vascular dementia without behavioral disturbance: Secondary | ICD-10-CM | POA: Insufficient documentation

## 2014-06-03 DIAGNOSIS — E785 Hyperlipidemia, unspecified: Secondary | ICD-10-CM

## 2014-06-03 DIAGNOSIS — F0152 Vascular dementia, unspecified severity, with psychotic disturbance: Secondary | ICD-10-CM | POA: Insufficient documentation

## 2014-06-03 DIAGNOSIS — E1169 Type 2 diabetes mellitus with other specified complication: Secondary | ICD-10-CM | POA: Insufficient documentation

## 2014-06-20 ENCOUNTER — Non-Acute Institutional Stay (SKILLED_NURSING_FACILITY): Payer: Medicare Other | Admitting: Internal Medicine

## 2014-06-20 ENCOUNTER — Encounter: Payer: Self-pay | Admitting: Internal Medicine

## 2014-06-20 DIAGNOSIS — B029 Zoster without complications: Secondary | ICD-10-CM

## 2014-06-20 DIAGNOSIS — F0152 Vascular dementia, unspecified severity, with psychotic disturbance: Secondary | ICD-10-CM

## 2014-06-20 DIAGNOSIS — F0151 Vascular dementia with behavioral disturbance: Secondary | ICD-10-CM

## 2014-06-20 DIAGNOSIS — F22 Delusional disorders: Secondary | ICD-10-CM

## 2014-06-20 DIAGNOSIS — F0150 Vascular dementia without behavioral disturbance: Secondary | ICD-10-CM

## 2014-06-20 MED ORDER — GABAPENTIN 100 MG PO CAPS: 100.0000 mg | ORAL_CAPSULE | Freq: Three times a day (TID) | ORAL | Status: AC

## 2014-06-20 MED ORDER — VALACYCLOVIR HCL 1 G PO TABS: 1000.0000 mg | ORAL_TABLET | Freq: Three times a day (TID) | ORAL | Status: AC

## 2014-06-20 NOTE — Progress Notes (Signed)
Patient ID: [name] [name], female   DOB: 06-06-1941, 73 y.o.   MRN: [id number]  Location:  [facility] Provider:  Rexene Edison. Mariea Clonts, D.O., C.M.D.  Code Status:  DNR  Chief Complaint  Patient presents with  . Acute Visit    painful vesicular rash on left side    HPI:  73 yo white female long term care resident with diabetes c/o new rash on her left side that is painful, burning and itchy.  DNS notes "it looks like shingles" and has already put the patient in contact isolation.  [name] [name] notes that she's had the rash for 2 days just beneath her left breast and in the middle of her chest.    Review of Systems:  Review of Systems  Constitutional: Positive for malaise/fatigue. Negative for fever and chills.  Respiratory: Negative for shortness of breath.   Cardiovascular: Negative for chest pain.  Musculoskeletal: Negative for myalgias.  Skin: Positive for itching and rash.  Psychiatric/Behavioral: Positive for memory loss.    Medications: Patient's Medications  New Prescriptions   No medications on file  Previous Medications   BENZOCAINE-RESORCINOL (VAGICREAM) 5-2 % VAGINAL CREAM    Place 1 application vaginally 2 (two) times daily as needed for itching (and/or burning).   CHOLECALCIFEROL (VITAMIN D) 2000 UNITS CAPS    Take 1 capsule by mouth daily.   DIPHENHYDRAMINE (SOMINEX) 25 MG TABLET    Take 25 mg by mouth every 6 (six) hours as needed for itching.    DONEPEZIL (ARICEPT) 10 MG TABLET    Take 1 tablet (10 mg total) by mouth at bedtime as needed.   DULOXETINE (CYMBALTA) 60 MG CAPSULE    Take 60 mg by mouth daily.   FLUTICASONE-SALMETEROL (ADVAIR) 250-50 MCG/DOSE AEPB    Inhale 1 puff into the lungs every 12 (twelve) hours.   HYDROCODONE-ACETAMINOPHEN (NORCO/VICODIN) 5-325 MG PER TABLET    Take 1 tablet by mouth every 4 (four) hours as needed for severe pain.   IPRATROPIUM-ALBUTEROL (DUONEB) 0.5-2.5 (3) MG/3ML SOLN    Take 3 mLs by nebulization every 6 (six) hours as  needed (shortness of breath).   LIDOCAINE (LIDODERM) 5 %    Place 2 patches onto the skin every morning. Apply two patches to lower back. Remove & Discard patch after 12 hours.   MEMANTINE HCL ER (NAMENDA XR) 28 MG CP24    Take 28 mg by mouth daily.   NYSTATIN (MYCOSTATIN/NYSTOP) 100000 UNIT/GM POWD    Apply 1 g topically daily. Apply to reddened skin folds daily until yeast infection resolved.   POTASSIUM CHLORIDE SA (K-DUR,KLOR-CON) 20 MEQ TABLET    Take 40 mEq by mouth daily.   PRAVASTATIN (PRAVACHOL) 20 MG TABLET    Take 20 mg by mouth daily.   PROMETHAZINE (PHENERGAN) 25 MG TABLET    Take 25 mg by mouth every 6 (six) hours as needed for nausea.   RANITIDINE (ZANTAC) 150 MG TABLET    Take 150 mg by mouth at bedtime.   RISPERIDONE (RISPERDAL) 0.25 MG TABLET    Take 0.25 mg by mouth at bedtime.    TRAMADOL (ULTRAM) 50 MG TABLET    Take two tablets by mouth every 6 hours as needed for pain  Modified Medications   No medications on file  Discontinued Medications   No medications on file    Physical Exam: Filed Vitals:   06/20/14 1557  BP: 124/69  Pulse: 79  Temp: 98.2 F (36.8 C)  Resp: 20  Physical Exam  Constitutional: She appears well-nourished. No distress.  Cardiovascular: Normal rate, regular rhythm and normal heart sounds.   Pulmonary/Chest: Effort normal and breath sounds normal.  Skin:  Erythematous papulovesicular rash beneath left breastline posteriorly and also just around midchest at that level--lesions of varying stages--some papules, some vesicles, some scabbed over     Labs reviewed: Basic Metabolic Panel:  Recent Labs  04/25/14  NA 142  K 4.2  BUN 14  CREATININE 0.6   Assessment/Plan 1. Herpes zoster infection of thoracic region -valacyclovir 1000mg  po q 8 hrs for 7 days -gabapentin 100mg  po tid for shingles pain -droplet isolation until all vesicles crusted over/scabbed over -if pain persists, would increase gabapentin or change to lyrica, lidoderm  patch can also be used for this pain after rash itself resolves  2. Vascular dementia with paranoia -change aricept and namenda to namzaric if insurance will cover and cost acceptable at facility  Family/ staff Communication: discussed with DNS  Goals of care: comfort measures, treatment of reversible conditions, DNR

## 2014-07-20 ENCOUNTER — Non-Acute Institutional Stay (SKILLED_NURSING_FACILITY): Payer: Medicare Other | Admitting: Adult Health

## 2014-07-20 DIAGNOSIS — F0150 Vascular dementia without behavioral disturbance: Secondary | ICD-10-CM

## 2014-07-20 DIAGNOSIS — M545 Low back pain: Secondary | ICD-10-CM

## 2014-07-20 DIAGNOSIS — K219 Gastro-esophageal reflux disease without esophagitis: Secondary | ICD-10-CM | POA: Diagnosis not present

## 2014-07-20 DIAGNOSIS — J449 Chronic obstructive pulmonary disease, unspecified: Secondary | ICD-10-CM | POA: Diagnosis not present

## 2014-07-20 DIAGNOSIS — E1169 Type 2 diabetes mellitus with other specified complication: Secondary | ICD-10-CM | POA: Diagnosis not present

## 2014-07-20 DIAGNOSIS — F29 Unspecified psychosis not due to a substance or known physiological condition: Secondary | ICD-10-CM | POA: Diagnosis not present

## 2014-07-20 DIAGNOSIS — F0152 Vascular dementia, unspecified severity, with psychotic disturbance: Secondary | ICD-10-CM

## 2014-07-20 DIAGNOSIS — E876 Hypokalemia: Secondary | ICD-10-CM | POA: Diagnosis not present

## 2014-07-20 DIAGNOSIS — F0151 Vascular dementia with behavioral disturbance: Secondary | ICD-10-CM

## 2014-07-20 DIAGNOSIS — E785 Hyperlipidemia, unspecified: Secondary | ICD-10-CM

## 2014-07-20 DIAGNOSIS — F22 Delusional disorders: Secondary | ICD-10-CM

## 2014-07-20 DIAGNOSIS — IMO0001 Reserved for inherently not codable concepts without codable children: Secondary | ICD-10-CM

## 2014-07-25 ENCOUNTER — Encounter: Payer: Self-pay | Admitting: Adult Health

## 2014-07-25 NOTE — Progress Notes (Signed)
Patient ID: [name] [name], female   DOB: 09/26/1940, 73 y.o.   MRN: [id number]  [facility]     Allergies  Allergen Reactions  . Avelox [Moxifloxacin Hcl In Nacl] Hives  . Codeine     Reports her throat swelled in the past but has tolerated since.  . Compazine [Prochlorperazine Edisylate] Nausea And Vomiting  . Morphine And Related     Patient doesn't recall  . Penicillins     Patient doesn't recall.  . Phenothiazines     Patient doesn't recall  . Prednisone     Insomnia, confusion.  Marland Kitchen Reglan [Metoclopramide]     Patient doesn't recall "but it was awful"  . Sulfonamide Derivatives Hives       Chief Complaint  Patient presents with  . Medical Management of Chronic Issues    HPI:  She is a long term resident of this facility being seen for the management of her chronic illnesses. Overall her status remain stable over the past couple of months. Her risperdal has recently been stopped, she had been taking this medication due to her continued psychological issues with vaginal itching without physical cause. She is not voicing any complaints at this time. Nursing staff has no concerns at this time.     Past Medical History  Diagnosis Date  . Depressive disorder, not elsewhere classified   . Chronic airway obstruction, not elsewhere classified   . Spinal stenosis   . Lumbago   . Esophageal reflux   . Anxiety state, unspecified   . Abnormality of gait   . Candidiasis of other urogenital sites     Past Surgical History  Procedure Laterality Date  . Cholecystectomy    . Appendectomy    . Abdominal hysterectomy    . Vertebroplasty      VITAL SIGNS BP 138/76 mmHg  Pulse 66  Ht 5\' 1"  (1.549 m)  Wt 185 lb (83.915 kg)  BMI 34.97 kg/m2  SpO2 97%   Outpatient Encounter Prescriptions as of 07/20/2014  Medication Sig  . benzocaine-resorcinol (VAGICREAM) 5-2 % vaginal cream Place 1 application vaginally 2 (two) times daily as needed for itching (and/or burning).  .  Cholecalciferol (VITAMIN D) 2000 UNITS CAPS Take 1 capsule by mouth daily.  . diphenhydrAMINE (SOMINEX) 25 MG tablet Take 25 mg by mouth every 6 (six) hours as needed for itching.   . DULoxetine (CYMBALTA) 60 MG capsule Take 60 mg by mouth daily.  . Fluticasone-Salmeterol (ADVAIR) 250-50 MCG/DOSE AEPB Inhale 1 puff into the lungs every 12 (twelve) hours.  . gabapentin (NEURONTIN) 100 MG capsule Take 1 capsule (100 mg total) by mouth 3 (three) times daily.  Marland Kitchen ipratropium-albuterol (DUONEB) 0.5-2.5 (3) MG/3ML SOLN Take 3 mLs by nebulization every 6 (six) hours as needed (shortness of breath).  . lidocaine (LIDODERM) 5 % Place 2 patches onto the skin every morning. Apply two patches to lower back. Remove & Discard patch after 12 hours.  . Memantine HCl-Donepezil HCl (NAMZARIC) 28-10 MG CP24 Take 28 capsules by mouth daily.  . potassium chloride SA (K-DUR,KLOR-CON) 20 MEQ tablet Take 40 mEq by mouth daily.  . pravastatin (PRAVACHOL) 20 MG tablet Take 20 mg by mouth daily.  . ranitidine (ZANTAC) 150 MG tablet Take 150 mg by mouth at bedtime.  . traMADol (ULTRAM) 50 MG tablet Take two tablets by mouth every 6 hours as needed for pain  . [DISCONTINUED] donepezil (ARICEPT) 10 MG tablet Take 1 tablet (10 mg total) by mouth at bedtime as needed. (  Patient not taking: Reported on 07/25/2014)  . [DISCONTINUED] HYDROcodone-acetaminophen (NORCO/VICODIN) 5-325 MG per tablet Take 1 tablet by mouth every 4 (four) hours as needed for severe pain.  . [DISCONTINUED] Memantine HCl ER (NAMENDA XR) 28 MG CP24 Take 28 mg by mouth daily.  . [DISCONTINUED] nystatin (MYCOSTATIN/NYSTOP) 100000 UNIT/GM POWD Apply 1 g topically daily. Apply to reddened skin folds daily until yeast infection resolved.  . [DISCONTINUED] promethazine (PHENERGAN) 25 MG tablet Take 25 mg by mouth every 6 (six) hours as needed for nausea.  . [DISCONTINUED] risperiDONE (RISPERDAL) 0.25 MG tablet Take 0.25 mg by mouth at bedtime.      SIGNIFICANT  DIAGNOSTIC EXAMS    LABS REVIEWED:   01-08-14: wbc 9.9; hgb 13.4; hct 42.7; mcv 89.7 ;plt 322; glucose 104; bun 14.9; creat 0.6; k+3.9; na++141; liver normal albumin 3.8; vit d 31.7; hgb a1c 7.0 04-25-14: glucose 129'; bun 14.4; creat 0.65; k+4.2; na++142; hgb a1c 6.7    Review of Systems  Constitutional: Negative for malaise/fatigue.  Respiratory: Negative for cough and shortness of breath.   Cardiovascular: Negative for chest pain, palpitations and leg swelling.  Gastrointestinal: Negative for heartburn, abdominal pain and constipation.  Genitourinary: Negative for dysuria.  Musculoskeletal: Positive for back pain. Negative for myalgias and joint pain.       Her pain is being managed   Skin: Negative for itching and rash.  Neurological: Negative for headaches.  Psychiatric/Behavioral: Negative for depression. The patient is not nervous/anxious.      Physical Exam  Constitutional: She is oriented to person, place, and time. No distress.  Overweight   Neck: Neck supple. No JVD present. No thyromegaly present.  Cardiovascular: Normal rate, regular rhythm and intact distal pulses.   Respiratory: Effort normal and breath sounds normal. No respiratory distress.  GI: Soft. Bowel sounds are normal. She exhibits no distension. There is no tenderness.  Musculoskeletal: She exhibits no edema.  Is able to move all extremities   Neurological: She is alert and oriented to person, place, and time.  Skin: Skin is warm and dry. She is not diaphoretic.       ASSESSMENT/ PLAN:  1. COPD: she is stable will continue advair 250/50 twice daily; and duoneb every 6 hours as needed will monitor  2. Dyslipidemia: will continue pravachol 20 mg daily   3. Hypokalemia: will continue k+ 40 meq daily  4. Vascular dementia: without change in her status; will continue namzaric 28-10 mg daily and will monitor   5. Gerd: will continue zantac 150 mg daily   6. Chronic low back pain: she is presently  stable; will continue lidoderm 2 patches to lower back daily; neurontin 100 mg three times daily; cymbalta 60 mg daily and ultram 100 mg every 6 hours as needed and will monitor her status.   7. Anxiety: she is stable and is benefiting from cymbalta 60 mg daily will not make changes will monitor   8. Psychosis: her risperdal has been stopped; will continue to monitor her status.      Ok Edwards NP [facility]  Contact [phone] Monday through Friday 8am- 5pm  After hours call [phone]

## 2014-08-26 ENCOUNTER — Non-Acute Institutional Stay (SKILLED_NURSING_FACILITY): Payer: Medicare Other | Admitting: Internal Medicine

## 2014-08-26 ENCOUNTER — Encounter: Payer: Self-pay | Admitting: Internal Medicine

## 2014-08-26 DIAGNOSIS — IMO0001 Reserved for inherently not codable concepts without codable children: Secondary | ICD-10-CM

## 2014-08-26 DIAGNOSIS — F0152 Vascular dementia, unspecified severity, with psychotic disturbance: Secondary | ICD-10-CM

## 2014-08-26 DIAGNOSIS — E785 Hyperlipidemia, unspecified: Secondary | ICD-10-CM

## 2014-08-26 DIAGNOSIS — E1169 Type 2 diabetes mellitus with other specified complication: Secondary | ICD-10-CM

## 2014-08-26 DIAGNOSIS — F0151 Vascular dementia with behavioral disturbance: Secondary | ICD-10-CM

## 2014-08-26 DIAGNOSIS — F22 Delusional disorders: Secondary | ICD-10-CM

## 2014-08-26 DIAGNOSIS — E119 Type 2 diabetes mellitus without complications: Secondary | ICD-10-CM

## 2014-08-26 DIAGNOSIS — J449 Chronic obstructive pulmonary disease, unspecified: Secondary | ICD-10-CM

## 2014-08-26 DIAGNOSIS — F0150 Vascular dementia without behavioral disturbance: Secondary | ICD-10-CM

## 2014-08-26 NOTE — Progress Notes (Signed)
Patient ID: [name] [name], female   DOB: 05-Sep-1940, 74 y.o.   MRN: [id number]    Facility  [facility]    Place of Service:   SNF   Allergies  Allergen Reactions  . Avelox [Moxifloxacin Hcl In Nacl] Hives  . Codeine     Reports her throat swelled in the past but has tolerated since.  . Compazine [Prochlorperazine Edisylate] Nausea And Vomiting  . Morphine And Related     Patient doesn't recall  . Penicillins     Patient doesn't recall.  . Phenothiazines     Patient doesn't recall  . Prednisone     Insomnia, confusion.  Marland Kitchen Reglan [Metoclopramide]     Patient doesn't recall "but it was awful"  . Sulfonamide Derivatives Hives    Chief Complaint  Patient presents with  . Medical Management of Chronic Issues    vascular dementia/psychosis, COPD bronchitis, DM II with A1c 6.7 in Sept 2015, hyperlipdemia, low back pain, GERD    HPI:  74 yo female [name] term resident seen today for above. She has no concerns today. Poor historian due to dementia.No nursing issues  Medications: Patient's Medications  New Prescriptions   No medications on file  Previous Medications   BENZOCAINE-RESORCINOL (VAGICREAM) 5-2 % VAGINAL CREAM    Place 1 application vaginally 2 (two) times daily as needed for itching (and/or burning).   CHOLECALCIFEROL (VITAMIN D) 2000 UNITS CAPS    Take 1 capsule by mouth daily.   DIPHENHYDRAMINE (SOMINEX) 25 MG TABLET    Take 25 mg by mouth every 6 (six) hours as needed for itching.    DULOXETINE (CYMBALTA) 60 MG CAPSULE    Take 60 mg by mouth daily.   FLUTICASONE-SALMETEROL (ADVAIR) 250-50 MCG/DOSE AEPB    Inhale 1 puff into the lungs every 12 (twelve) hours.   GABAPENTIN (NEURONTIN) 100 MG CAPSULE    Take 1 capsule (100 mg total) by mouth 3 (three) times daily.   IPRATROPIUM-ALBUTEROL (DUONEB) 0.5-2.5 (3) MG/3ML SOLN    Take 3 mLs by nebulization every 6 (six) hours as needed (shortness of breath).   LIDOCAINE (LIDODERM) 5 %    Place 2 patches onto the  skin every morning. Apply two patches to lower back. Remove & Discard patch after 12 hours.   MEMANTINE HCL-DONEPEZIL HCL (NAMZARIC) 28-10 MG CP24    Take 28 capsules by mouth daily.   POTASSIUM CHLORIDE SA (K-DUR,KLOR-CON) 20 MEQ TABLET    Take 40 mEq by mouth daily.   PRAVASTATIN (PRAVACHOL) 20 MG TABLET    Take 20 mg by mouth daily.   RANITIDINE (ZANTAC) 150 MG TABLET    Take 150 mg by mouth at bedtime.   TRAMADOL (ULTRAM) 50 MG TABLET    Take two tablets by mouth every 6 hours as needed for pain  Modified Medications   No medications on file  Discontinued Medications   No medications on file     Review of Systems   Unable to obtain due to pt's dementia  There were no vitals filed for this visit. Pt apparently has not had VS recorded in several weeks.  There is no weight on file to calculate BMI.  Physical Exam CONSTITUTIONAL: Looks frail in NAD. Awake and alert HEENT: PERRLA. Oropharynx clear and without exudate. MMM [name]: Supple. Nontender. No palpable cervical or supraclavicular lymph nodes. No carotid bruit b/l.   CVS: Regular rate with 1/6 SEmurmur at [place]. No gallop or rub. LUNGS: (+) end expiratory wheezing with  reduced BS at base b/l. No rales or rhonchi. Prolonged expiratory phase ABDOMEN: Bowel sounds present x 4. Soft, nontender, nondistended. No palpable mass or bruit EXTREMITIES: No edema b/l. Distal pulses palpable. No calf tenderness PSYCH: confused  Labs reviewed: Nursing Home on 05/30/2014  Component Date Value Ref Range Status  . Glucose 04/25/2014 129   Final  . BUN 04/25/2014 14  4 - 21 mg/dL Final  . Creatinine 04/25/2014 0.6  0.5 - 1.1 mg/dL Final  . Potassium 04/25/2014 4.2  3.4 - 5.3 mmol/L Final  . Sodium 04/25/2014 142  137 - 147 mmol/L Final  . Triglycerides 04/25/2014 89  40 - 160 mg/dL Final  . Cholesterol 04/25/2014 195  0 - 200 mg/dL Final  . HDL 04/25/2014 53  35 - 70 mg/dL Final  . LDL Cholesterol 04/25/2014 124   Final  . Hgb A1c MFr Bld  04/25/2014 6.7* 4.0 - 6.0 % Final     Assessment/Plan    ICD-9-CM ICD-10-CM   1. Vascular dementia with paranoia 290.42 F01.51     F22   2. Diabetes mellitus type II, controlled, with no complications 438.37 R93.9   3. Hyperlipidemia associated with type 2 diabetes mellitus 250.80 E11.69    272.4 E78.5   4. COPD bronchitis 491.20 J44.9    --check fasting CMP, lipid panel and A1c at next lab draw  --continue current medications as ordered  --obtain vital signs weekly and record  --she is medically stable with current tx plan  [name] [name] S. [name] [name]  [name] [address] [name], [name] [address] [phone] Office (Wednesdays and Fridays 8 AM - 5 PM) [phone] Cell (Monday-Friday 8 AM - 5 PM)

## 2014-09-27 ENCOUNTER — Non-Acute Institutional Stay (SKILLED_NURSING_FACILITY): Payer: Medicare Other | Admitting: Adult Health

## 2014-09-27 DIAGNOSIS — E1169 Type 2 diabetes mellitus with other specified complication: Secondary | ICD-10-CM

## 2014-09-27 DIAGNOSIS — M545 Low back pain: Secondary | ICD-10-CM

## 2014-09-27 DIAGNOSIS — J449 Chronic obstructive pulmonary disease, unspecified: Secondary | ICD-10-CM

## 2014-09-27 DIAGNOSIS — K219 Gastro-esophageal reflux disease without esophagitis: Secondary | ICD-10-CM | POA: Diagnosis not present

## 2014-09-27 DIAGNOSIS — IMO0001 Reserved for inherently not codable concepts without codable children: Secondary | ICD-10-CM

## 2014-09-27 DIAGNOSIS — F411 Generalized anxiety disorder: Secondary | ICD-10-CM

## 2014-09-27 DIAGNOSIS — F0150 Vascular dementia without behavioral disturbance: Secondary | ICD-10-CM

## 2014-09-27 DIAGNOSIS — F22 Delusional disorders: Secondary | ICD-10-CM | POA: Diagnosis not present

## 2014-09-27 DIAGNOSIS — E119 Type 2 diabetes mellitus without complications: Secondary | ICD-10-CM | POA: Diagnosis not present

## 2014-09-27 DIAGNOSIS — E876 Hypokalemia: Secondary | ICD-10-CM | POA: Diagnosis not present

## 2014-09-27 DIAGNOSIS — E785 Hyperlipidemia, unspecified: Secondary | ICD-10-CM | POA: Diagnosis not present

## 2014-09-27 DIAGNOSIS — F0151 Vascular dementia with behavioral disturbance: Secondary | ICD-10-CM

## 2014-09-27 DIAGNOSIS — F0152 Vascular dementia, unspecified severity, with psychotic disturbance: Secondary | ICD-10-CM

## 2014-11-06 MED ORDER — FENOFIBRATE 48 MG PO TABS: 48.0000 mg | ORAL_TABLET | Freq: Every day | ORAL | Status: DC

## 2014-11-06 MED ORDER — LISINOPRIL 5 MG PO TABS: 2.5000 mg | ORAL_TABLET | Freq: Every day | ORAL | Status: DC

## 2014-11-06 MED ORDER — ASPIRIN EC 81 MG PO TBEC: 81.0000 mg | DELAYED_RELEASE_TABLET | Freq: Every day | ORAL | Status: AC

## 2014-11-06 NOTE — Progress Notes (Signed)
Patient ID: [name] [name], female   DOB: 01/15/1941, 74 y.o.   MRN: [id number]  [facility]     Allergies  Allergen Reactions  . Avelox [Moxifloxacin Hcl In Nacl] Hives  . Codeine     Reports her throat swelled in the past but has tolerated since.  . Compazine [Prochlorperazine Edisylate] Nausea And Vomiting  . Morphine And Related     Patient doesn't recall  . Penicillins     Patient doesn't recall.  . Phenothiazines     Patient doesn't recall  . Prednisone     Insomnia, confusion.  Marland Kitchen Reglan [Metoclopramide]     Patient doesn't recall "but it was awful"  . Sulfonamide Derivatives Hives       Chief Complaint  Patient presents with  . Medical Management of Chronic Issues    HPI:  She is a long term resident of this facility being seen for the management of her chronic illnesses. Overall she is stable. She is not voicing any complaints or concerns today. There are no nursing concerns today.    Past Medical History  Diagnosis Date  . Depressive disorder, not elsewhere classified   . Chronic airway obstruction, not elsewhere classified   . Spinal stenosis   . Lumbago   . Esophageal reflux   . Anxiety state, unspecified   . Abnormality of gait   . Candidiasis of other urogenital sites     Past Surgical History  Procedure Laterality Date  . Cholecystectomy    . Appendectomy    . Abdominal hysterectomy    . Vertebroplasty      VITAL SIGNS BP 133/65 mmHg  Pulse 76  Ht 5\' 2"  (1.575 m)  Wt 193 lb (87.544 kg)  BMI 35.29 kg/m2  SpO2 97%   Outpatient Encounter Prescriptions as of 09/27/2014  Medication Sig  . benzocaine-resorcinol (VAGICREAM) 5-2 % vaginal cream Place 1 application vaginally 2 (two) times daily as needed for itching (and/or burning).  . Cholecalciferol (VITAMIN D) 2000 UNITS CAPS Take 1 capsule by mouth daily.  . diphenhydrAMINE (SOMINEX) 25 MG tablet Take 25 mg by mouth every 6 (six) hours as needed for itching.   . DULoxetine (CYMBALTA) 60 MG  capsule Take 60 mg by mouth daily.  . Fluticasone-Salmeterol (ADVAIR) 250-50 MCG/DOSE AEPB Inhale 1 puff into the lungs every 12 (twelve) hours.  . gabapentin (NEURONTIN) 100 MG capsule Take 1 capsule (100 mg total) by mouth 3 (three) times daily.  Marland Kitchen ipratropium-albuterol (DUONEB) 0.5-2.5 (3) MG/3ML SOLN Take 3 mLs by nebulization every 6 (six) hours as needed (shortness of breath).  . lidocaine (LIDODERM) 5 % Place 2 patches onto the skin every morning. Apply two patches to lower back. Remove & Discard patch after 12 hours.  . Memantine HCl-Donepezil HCl (NAMZARIC) 28-10 MG CP24 Take 28 capsules by mouth daily.  . potassium chloride SA (K-DUR,KLOR-CON) 20 MEQ tablet Take 40 mEq by mouth daily.  . pravastatin (PRAVACHOL) 20 MG tablet Take 20 mg by mouth daily.  . ranitidine (ZANTAC) 150 MG tablet Take 150 mg by mouth at bedtime.  . traMADol (ULTRAM) 50 MG tablet Take two tablets by mouth every 6 hours as needed for pain     SIGNIFICANT DIAGNOSTIC EXAMS   LABS REVIEWED:   01-08-14: wbc 9.9; hgb 13.4; hct 42.7; mcv 89.7 ;plt 322; glucose 104; bun 14.9; creat 0.6; k+3.9; na++141; liver normal albumin 3.8; vit d 31.7; hgb a1c 7.0 04-25-14: glucose 129'; bun 14.4; creat 0.65; k+4.2; na++142; hgb a1c 6.7  08-24-14:glucose 151; bun 17.9; creat 0.6; k+ 4.2; na++139; liver normal albumin 4.0; hgb a1c 7.0  chol 180; ldl 83; trig 218; hdl 54    ROS Constitutional: Negative for malaise/fatigue.  Respiratory: Negative for cough and shortness of breath.   Cardiovascular: Negative for chest pain, palpitations and leg swelling.  Gastrointestinal: Negative for heartburn, abdominal pain and constipation.  Genitourinary: Negative for dysuria.  Musculoskeletal: Positive for back pain. Negative for myalgias and joint pain.       Her pain is being managed   Skin: Negative for itching and rash.  Neurological: Negative for headaches.  Psychiatric/Behavioral: Negative for depression. The patient is not  nervous/anxious.      Physical Exam Constitutional: She is oriented to person, place, and time. No distress.  Overweight   Neck: Neck supple. No JVD present. No thyromegaly present.  Cardiovascular: Normal rate, regular rhythm and intact distal pulses.   Respiratory: Effort normal and breath sounds normal. No respiratory distress.  GI: Soft. Bowel sounds are normal. She exhibits no distension. There is no tenderness.  Musculoskeletal: She exhibits no edema.  Is able to move all extremities   Neurological: She is alert and oriented to person, place, and time.  Skin: Skin is warm and dry. She is not diaphoretic.      ASSESSMENT/ PLAN:  1. COPD: she is stable will continue advair 250/50 twice daily; and duoneb every 6 hours as needed will monitor  2. Dyslipidemia: will continue pravachol 20 mg daily will begin fenofibrate 48 mg daily ldl is 83; trig 218 Will check lipids and liver function in 6 weeks   3. Hypokalemia: will continue k+ 40 meq daily  4. Vascular dementia: without change in her status; will continue namzaric 28-10 mg daily and will monitor   5. Gerd: will continue zantac 150 mg daily   6. Chronic low back pain: she is presently stable; will continue lidoderm 2 patches to lower back daily; neurontin 100 mg three times daily; cymbalta 60 mg daily and ultram 100 mg every 6 hours as needed and will monitor her status.   7. Anxiety: she is stable and is benefiting from cymbalta 60 mg daily will not make changes will monitor   8. Psychosis: her risperdal has been stopped; will continue to monitor her status.   9. Diabetes: will check urine for micro-albumin and will being lisinopril 2.5 mg daily and asa 81 mg daily   Will check cbc; and bmp in one week.      Ok Edwards NP [facility]  Contact [phone] Monday through Friday 8am- 5pm  After hours call [phone]

## 2014-11-08 ENCOUNTER — Non-Acute Institutional Stay (SKILLED_NURSING_FACILITY): Payer: Medicare Other | Admitting: Adult Health

## 2014-11-08 DIAGNOSIS — F22 Delusional disorders: Secondary | ICD-10-CM | POA: Diagnosis not present

## 2014-11-08 DIAGNOSIS — F0151 Vascular dementia with behavioral disturbance: Secondary | ICD-10-CM | POA: Diagnosis not present

## 2014-11-08 DIAGNOSIS — E785 Hyperlipidemia, unspecified: Secondary | ICD-10-CM

## 2014-11-08 DIAGNOSIS — E876 Hypokalemia: Secondary | ICD-10-CM | POA: Diagnosis not present

## 2014-11-08 DIAGNOSIS — J449 Chronic obstructive pulmonary disease, unspecified: Secondary | ICD-10-CM | POA: Diagnosis not present

## 2014-11-08 DIAGNOSIS — F0150 Vascular dementia without behavioral disturbance: Secondary | ICD-10-CM

## 2014-11-08 DIAGNOSIS — F411 Generalized anxiety disorder: Secondary | ICD-10-CM

## 2014-11-08 DIAGNOSIS — K219 Gastro-esophageal reflux disease without esophagitis: Secondary | ICD-10-CM | POA: Diagnosis not present

## 2014-11-08 DIAGNOSIS — M545 Low back pain: Secondary | ICD-10-CM | POA: Diagnosis not present

## 2014-11-08 DIAGNOSIS — E119 Type 2 diabetes mellitus without complications: Secondary | ICD-10-CM

## 2014-11-08 DIAGNOSIS — F29 Unspecified psychosis not due to a substance or known physiological condition: Secondary | ICD-10-CM | POA: Diagnosis not present

## 2014-11-08 DIAGNOSIS — F0152 Vascular dementia, unspecified severity, with psychotic disturbance: Secondary | ICD-10-CM

## 2014-11-08 DIAGNOSIS — E1169 Type 2 diabetes mellitus with other specified complication: Secondary | ICD-10-CM

## 2014-11-08 DIAGNOSIS — IMO0001 Reserved for inherently not codable concepts without codable children: Secondary | ICD-10-CM

## 2014-12-06 ENCOUNTER — Non-Acute Institutional Stay (SKILLED_NURSING_FACILITY): Payer: Medicare Other | Admitting: Adult Health

## 2014-12-06 DIAGNOSIS — F22 Delusional disorders: Secondary | ICD-10-CM

## 2014-12-06 DIAGNOSIS — M810 Age-related osteoporosis without current pathological fracture: Secondary | ICD-10-CM

## 2014-12-06 DIAGNOSIS — E119 Type 2 diabetes mellitus without complications: Secondary | ICD-10-CM

## 2014-12-06 DIAGNOSIS — IMO0001 Reserved for inherently not codable concepts without codable children: Secondary | ICD-10-CM

## 2014-12-06 DIAGNOSIS — E1169 Type 2 diabetes mellitus with other specified complication: Secondary | ICD-10-CM

## 2014-12-06 DIAGNOSIS — F411 Generalized anxiety disorder: Secondary | ICD-10-CM

## 2014-12-06 DIAGNOSIS — F0150 Vascular dementia without behavioral disturbance: Secondary | ICD-10-CM

## 2014-12-06 DIAGNOSIS — F0151 Vascular dementia with behavioral disturbance: Secondary | ICD-10-CM | POA: Diagnosis not present

## 2014-12-06 DIAGNOSIS — F29 Unspecified psychosis not due to a substance or known physiological condition: Secondary | ICD-10-CM | POA: Diagnosis not present

## 2014-12-06 DIAGNOSIS — M545 Low back pain: Secondary | ICD-10-CM

## 2014-12-06 DIAGNOSIS — E785 Hyperlipidemia, unspecified: Secondary | ICD-10-CM

## 2014-12-06 DIAGNOSIS — K219 Gastro-esophageal reflux disease without esophagitis: Secondary | ICD-10-CM

## 2014-12-06 DIAGNOSIS — E876 Hypokalemia: Secondary | ICD-10-CM | POA: Diagnosis not present

## 2014-12-06 DIAGNOSIS — J449 Chronic obstructive pulmonary disease, unspecified: Secondary | ICD-10-CM

## 2014-12-06 DIAGNOSIS — F0152 Vascular dementia, unspecified severity, with psychotic disturbance: Secondary | ICD-10-CM

## 2014-12-07 ENCOUNTER — Encounter: Payer: Self-pay | Admitting: *Deleted

## 2014-12-07 LAB — HEMOGLOBIN A1C: Hgb A1c MFr Bld: 7.2 % — AB (ref 4.0–6.0)

## 2014-12-20 ENCOUNTER — Encounter: Payer: Self-pay | Admitting: Adult Health

## 2014-12-20 DIAGNOSIS — M810 Age-related osteoporosis without current pathological fracture: Secondary | ICD-10-CM | POA: Insufficient documentation

## 2014-12-20 LAB — LIPID PANEL
Cholesterol: 203 mg/dL — AB (ref 0–200)
HDL: 62 mg/dL (ref 35–70)
LDL Cholesterol: 124 mg/dL
LDL/HDL RATIO: 3.3
Triglycerides: 89 mg/dL (ref 40–160)

## 2014-12-20 NOTE — Progress Notes (Signed)
Patient ID: [name] [name], female   DOB: 06/12/1941, 74 y.o.   MRN: [id number]  [facility]     Allergies  Allergen Reactions  . Avelox [Moxifloxacin Hcl In Nacl] Hives  . Codeine     Reports her throat swelled in the past but has tolerated since.  . Compazine [Prochlorperazine Edisylate] Nausea And Vomiting  . Morphine And Related     Patient doesn't recall  . Penicillins     Patient doesn't recall.  . Phenothiazines     Patient doesn't recall  . Prednisone     Insomnia, confusion.  Marland Kitchen Reglan [Metoclopramide]     Patient doesn't recall "but it was awful"  . Sulfonamide Derivatives Hives       Chief Complaint  Patient presents with  . Medical Management of Chronic Issues    HPI:  She is a long term resident of this facility being seen for the management of her chronic illnesses. Overall her status remains without change. She is not voicing any complaints or concerns. She states that her pain is being adequately managed. There are no nursing concerns at this time.    Past Medical History  Diagnosis Date  . Depressive disorder, not elsewhere classified   . Chronic airway obstruction, not elsewhere classified   . Spinal stenosis   . Lumbago   . Esophageal reflux   . Anxiety state, unspecified   . Abnormality of gait   . Candidiasis of other urogenital sites   . Osteoporosis     Past Surgical History  Procedure Laterality Date  . Cholecystectomy    . Appendectomy    . Abdominal hysterectomy    . Vertebroplasty      VITAL SIGNS BP 128/73 mmHg  Pulse 77  Ht 5\' 2"  (1.575 m)  Wt 190 lb (86.183 kg)  BMI 34.74 kg/m2   Outpatient Encounter Prescriptions as of 11/08/2014  Medication Sig  . aspirin EC 81 MG tablet Take 1 tablet (81 mg total) by mouth daily.  . benzocaine-resorcinol (VAGICREAM) 5-2 % vaginal cream Place 1 application vaginally 2 (two) times daily as needed for itching (and/or burning).  . Cholecalciferol (VITAMIN D) 2000 UNITS CAPS Take 1 capsule  by mouth daily. For vitamin D deficiency  . diphenhydrAMINE (SOMINEX) 25 MG tablet Take 25 mg by mouth every 6 (six) hours as needed for itching.   . DULoxetine (CYMBALTA) 60 MG capsule Take 60 mg by mouth daily. For depression  . fenofibrate (TRICOR) 48 MG tablet Take 1 tablet (48 mg total) by mouth daily. (Patient taking differently: Take 48 mg by mouth daily. For increased trig)  . Fluticasone-Salmeterol (ADVAIR) 250-50 MCG/DOSE AEPB Inhale 1 puff into the lungs every 12 (twelve) hours. For COPD  . gabapentin (NEURONTIN) 100 MG capsule Take 1 capsule (100 mg total) by mouth 3 (three) times daily.  Marland Kitchen ipratropium-albuterol (DUONEB) 0.5-2.5 (3) MG/3ML SOLN Take 3 mLs by nebulization every 6 (six) hours as needed (shortness of breath).  . lidocaine (LIDODERM) 5 % Place 2 patches onto the skin every morning. Apply two patches to lower back. Remove & Discard patch after 12 hours.  Marland Kitchen lisinopril (PRINIVIL,ZESTRIL) 5 MG tablet Take 0.5 tablets (2.5 mg total) by mouth daily.  . Memantine HCl-Donepezil HCl (NAMZARIC) 28-10 MG CP24 Take 28 capsules by mouth daily. For dementia  . potassium chloride SA (K-DUR,KLOR-CON) 20 MEQ tablet Take 40 mEq by mouth daily. Muscle weakness  . pravastatin (PRAVACHOL) 20 MG tablet Take 20 mg by mouth daily. For hyperlipidemia  .  ranitidine (ZANTAC) 150 MG tablet Take 150 mg by mouth at bedtime. For reflux  . traMADol (ULTRAM) 50 MG tablet Take two tablets by mouth every 6 hours as needed for pain      SIGNIFICANT DIAGNOSTIC EXAMS    LABS REVIEWED:   01-08-14: wbc 9.9; hgb 13.4; hct 42.7; mcv 89.7 ;plt 322; glucose 104; bun 14.9; creat 0.6; k+3.9; na++141; liver normal albumin 3.8; vit d 31.7; hgb a1c 7.0 04-25-14: glucose 129'; bun 14.4; creat 0.65; k+4.2; na++142; hgb a1c 6.7 08-24-14:glucose 151; bun 17.9; creat 0.6; k+ 4.2; na++139; liver normal albumin 4.0; hgb a1c 7.0  chol 180; ldl 83; trig 218; hdl 54  10-01-14: wbc 10.2; hgb 12.7; hct 43.9; mcv 93.5; plt 329;  glucose 135; bun 13.4; creat 0.67; k+4.4; na++141; liver normal albumin 3.6; chol 181; ldl 103; trig 153; hdl 48      ROS Constitutional: Negative for malaise/fatigue.  Respiratory: Negative for cough and shortness of breath.   Cardiovascular: Negative for chest pain, palpitations and leg swelling.  Gastrointestinal: Negative for heartburn, abdominal pain and constipation.  Genitourinary: Negative for dysuria.  Musculoskeletal: Positive for back pain. Negative for myalgias and joint pain.       Her pain is being managed   Skin: Negative for itching and rash.  Neurological: Negative for headaches.  Psychiatric/Behavioral: Negative for depression. The patient is not nervous/anxious    Physical Exam Constitutional: She is oriented to person, place, and time. No distress.  Overweight   Neck: Neck supple. No JVD present. No thyromegaly present.  Cardiovascular: Normal rate, regular rhythm and intact distal pulses.   Respiratory: Effort normal and breath sounds normal. No respiratory distress.  GI: Soft. Bowel sounds are normal. She exhibits no distension. There is no tenderness.  Musculoskeletal: She exhibits no edema.  Is able to move all extremities   Neurological: She is alert and oriented to person, place, and time.  Skin: Skin is warm and dry. She is not diaphoretic.       ASSESSMENT/ PLAN:   1. COPD: she is stable will continue advair 250/50 twice daily; and duoneb every 6 hours as needed will monitor  2. Dyslipidemia: will continue pravachol 20 mg daily will continue  fenofibrate 48 mg daily ldl is 103; trig 153    3. Hypokalemia: will continue k+ 40 meq daily  4. Vascular dementia: without change in her status; will continue namzaric 28-10 mg daily and will monitor   5. Gerd: will continue zantac 150 mg daily   6. Chronic low back pain: she is presently stable; will continue lidoderm 2 patches to lower back daily; neurontin 100 mg three times daily; cymbalta 60 mg  daily and ultram 100 mg every 6 hours as needed and will monitor her status.   7. Anxiety: she is stable and is benefiting from cymbalta 60 mg daily will not make changes will monitor   8. Psychosis: her risperdal has been stopped; will continue to monitor her status.   9. Diabetes:  will continue lisinopril 2.5 mg daily and asa 81 mg daily    Ok Edwards NP [facility]  Contact [phone] Monday through Friday 8am- 5pm  After hours call [phone]

## 2014-12-25 ENCOUNTER — Other Ambulatory Visit: Payer: Self-pay | Admitting: Internal Medicine

## 2014-12-25 ENCOUNTER — Other Ambulatory Visit: Payer: Self-pay

## 2014-12-25 DIAGNOSIS — E2839 Other primary ovarian failure: Secondary | ICD-10-CM

## 2014-12-25 DIAGNOSIS — Z1231 Encounter for screening mammogram for malignant neoplasm of breast: Secondary | ICD-10-CM

## 2015-01-01 ENCOUNTER — Inpatient Hospital Stay: Admission: RE | Admit: 2015-01-01 | Payer: Federal, State, Local not specified - PPO | Source: Ambulatory Visit

## 2015-01-01 ENCOUNTER — Ambulatory Visit: Payer: Federal, State, Local not specified - PPO

## 2015-01-04 NOTE — Progress Notes (Signed)
Patient ID: [name] [name], female   DOB: 11/12/1940, 74 y.o.   MRN: [id number]  [facility]     Allergies  Allergen Reactions  . Avelox [Moxifloxacin Hcl In Nacl] Hives  . Codeine     Reports her throat swelled in the past but has tolerated since.  . Compazine [Prochlorperazine Edisylate] Nausea And Vomiting  . Morphine And Related     Patient doesn't recall  . Penicillins     Patient doesn't recall.  . Phenothiazines     Patient doesn't recall  . Prednisone     Insomnia, confusion.  Marland Kitchen Reglan [Metoclopramide]     Patient doesn't recall "but it was awful"  . Sulfonamide Derivatives Hives       Chief Complaint  Patient presents with  . Annual Exam    HPI:  She is a long term resident of this facility being seen for her annual exam. She has remained stable over the past several months. She has not had any recent infections. She states that she is feeling good and is not voicing any complaints. There are no nursing concerns at this time.     Past Medical History  Diagnosis Date  . Depressive disorder, not elsewhere classified   . Chronic airway obstruction, not elsewhere classified   . Spinal stenosis   . Lumbago   . Esophageal reflux   . Anxiety state, unspecified   . Abnormality of gait   . Candidiasis of other urogenital sites   . Osteoporosis     Past Surgical History  Procedure Laterality Date  . Cholecystectomy    . Appendectomy    . Abdominal hysterectomy    . Vertebroplasty     History   Social History  . Marital Status: Divorced    Spouse Name: N/A  . Number of Children: N/A  . Years of Education: N/A   Occupational History  . Not on file.   Social History Main Topics  . Smoking status: Former Research scientist (life sciences)  . Smokeless tobacco: Not on file  . Alcohol Use: No  . Drug Use: No  . Sexual Activity: No   Other Topics Concern  . Not on file   Social History Narrative    VITAL SIGNS BP 122/80 mmHg  Pulse 88  Ht 5\' 2"  (1.575 m)  Wt 191 lb  (86.637 kg)  BMI 34.93 kg/m2  SpO2 98%   Outpatient Encounter Prescriptions as of 12/06/2014  Medication Sig  . aspirin EC 81 MG tablet Take 1 tablet (81 mg total) by mouth daily.  . benzocaine-resorcinol (VAGICREAM) 5-2 % vaginal cream Place 1 application vaginally 2 (two) times daily as needed for itching (and/or burning).  . Cholecalciferol (VITAMIN D) 2000 UNITS CAPS Take 1 capsule by mouth daily. For vitamin D deficiency  . diphenhydrAMINE (SOMINEX) 25 MG tablet Take 25 mg by mouth every 6 (six) hours as needed for itching.   . DULoxetine (CYMBALTA) 60 MG capsule Take 60 mg by mouth daily. For depression  . fenofibrate (TRICOR) 48 MG tablet Take 1 tablet (48 mg total) by mouth daily. (Patient taking differently: Take 48 mg by mouth daily. For increased trig)  . Fluticasone-Salmeterol (ADVAIR) 250-50 MCG/DOSE AEPB Inhale 1 puff into the lungs every 12 (twelve) hours. For COPD  . gabapentin (NEURONTIN) 100 MG capsule Take 1 capsule (100 mg total) by mouth 3 (three) times daily.  Marland Kitchen ipratropium-albuterol (DUONEB) 0.5-2.5 (3) MG/3ML SOLN Take 3 mLs by nebulization every 6 (six) hours as needed (shortness of breath).  Marland Kitchen  lidocaine (LIDODERM) 5 % Place 2 patches onto the skin every morning. Apply two patches to lower back. Remove & Discard patch after 12 hours.  Marland Kitchen lisinopril (PRINIVIL,ZESTRIL) 5 MG tablet Take 0.5 tablets (2.5 mg total) by mouth daily.  . Memantine HCl-Donepezil HCl (NAMZARIC) 28-10 MG CP24 Take 28 capsules by mouth daily. For dementia  . potassium chloride SA (K-DUR,KLOR-CON) 20 MEQ tablet Take 40 mEq by mouth daily. Muscle weakness  . pravastatin (PRAVACHOL) 20 MG tablet Take 20 mg by mouth daily. For hyperlipidemia  . ranitidine (ZANTAC) 150 MG tablet Take 150 mg by mouth at bedtime. For reflux  . traMADol (ULTRAM) 50 MG tablet Take two tablets by mouth every 6 hours as needed for pain      SIGNIFICANT DIAGNOSTIC EXAMS    LABS REVIEWED:   01-08-14: wbc 9.9; hgb 13.4;  hct 42.7; mcv 89.7 ;plt 322; glucose 104; bun 14.9; creat 0.6; k+3.9; na++141; liver normal albumin 3.8; vit d 31.7; hgb a1c 7.0 04-25-14: glucose 129'; bun 14.4; creat 0.65; k+4.2; na++142; hgb a1c 6.7 08-24-14:glucose 151; bun 17.9; creat 0.6; k+ 4.2; na++139; liver normal albumin 4.0; hgb a1c 7.0  chol 180; ldl 83; trig 218; hdl 54  10-01-14: wbc 10.2; hgb 12.7; hct 43.9; mcv 93.5; plt 329; glucose 135; bun 13.4; creat 0.67; k+4.4; na++141; liver normal albumin 3.6; chol 181; ldl 103; trig 153; hdl 48  10-04-14: urine for micro-albumin: 1.3 11-08-14: chol 181; dl 103; trig 153; hdl 48; liver normal albumin 3.6      ROS Constitutional: Negative for malaise/fatigue.  Respiratory: Negative for cough and shortness of breath.   Cardiovascular: Negative for chest pain, palpitations and leg swelling.  Gastrointestinal: Negative for heartburn, abdominal pain and constipation.  Genitourinary: Negative for dysuria.  Musculoskeletal: Positive for back pain. Negative for myalgias and joint pain.       Her pain is being managed   Skin: Negative for itching and rash.  Neurological: Negative for headaches.  Psychiatric/Behavioral: Negative for depression. The patient is not nervous/anxious      Physical Exam  Constitutional: No distress.  Overweight   Eyes: Pupils are equal, round, and reactive to light.  Neck: Neck supple. No JVD present. No thyromegaly present.  Cardiovascular: Normal rate, regular rhythm and intact distal pulses.   Respiratory: Effort normal and breath sounds normal. No respiratory distress.  GI: Soft. Bowel sounds are normal. She exhibits no distension. There is no tenderness.  Genitourinary:  Breast exam normal   Musculoskeletal: Normal range of motion. She exhibits no edema.  Lymphadenopathy:    She has no cervical adenopathy.  Neurological: She is alert. No cranial nerve deficit.  Skin: Skin is warm and dry. She is not diaphoretic.       ASSESSMENT/ PLAN:  1.  COPD: she is stable will continue advair 250/50 twice daily; and duoneb every 6 hours as needed will monitor  2. Dyslipidemia: will continue pravachol 20 mg daily will increase fenofibrate to 120  mg daily ldl is 103; trig 153   3. Hypokalemia: will continue k+ 40 meq daily  4. Vascular dementia: without change in her status; will continue namzaric 28-10 mg daily and will monitor   5. Gerd: will continue zantac 150 mg daily   6. Chronic low back pain: she is presently stable; will continue lidoderm 2 patches to lower back daily; neurontin 100 mg three times daily; cymbalta 60 mg daily and ultram 100 mg every 6 hours as needed and will monitor her status.  7. Anxiety: she is stable and is benefiting from cymbalta 60 mg daily will not make changes will monitor   8. Psychosis: her risperdal has been stopped; will continue to monitor her status.   9. Diabetes:  will continue lisinopril 2.5 mg daily and asa 81 mg daily    Will check hgb a1c; mammogram; dexa scan and will guaiac stools X3     Ok Edwards NP [facility]  Contact [phone] Monday through Friday 8am- 5pm  After hours call [phone]

## 2015-01-14 ENCOUNTER — Encounter: Payer: Self-pay | Admitting: Internal Medicine

## 2015-01-14 ENCOUNTER — Non-Acute Institutional Stay (SKILLED_NURSING_FACILITY): Payer: Medicare Other | Admitting: Internal Medicine

## 2015-01-14 DIAGNOSIS — E119 Type 2 diabetes mellitus without complications: Secondary | ICD-10-CM | POA: Diagnosis not present

## 2015-01-14 DIAGNOSIS — F3340 Major depressive disorder, recurrent, in remission, unspecified: Secondary | ICD-10-CM

## 2015-01-14 DIAGNOSIS — F0152 Vascular dementia, unspecified severity, with psychotic disturbance: Secondary | ICD-10-CM

## 2015-01-14 DIAGNOSIS — K219 Gastro-esophageal reflux disease without esophagitis: Secondary | ICD-10-CM

## 2015-01-14 DIAGNOSIS — IMO0001 Reserved for inherently not codable concepts without codable children: Secondary | ICD-10-CM

## 2015-01-14 DIAGNOSIS — E1169 Type 2 diabetes mellitus with other specified complication: Secondary | ICD-10-CM | POA: Diagnosis not present

## 2015-01-14 DIAGNOSIS — F0150 Vascular dementia without behavioral disturbance: Secondary | ICD-10-CM

## 2015-01-14 DIAGNOSIS — F22 Delusional disorders: Secondary | ICD-10-CM | POA: Diagnosis not present

## 2015-01-14 DIAGNOSIS — J449 Chronic obstructive pulmonary disease, unspecified: Secondary | ICD-10-CM

## 2015-01-14 DIAGNOSIS — E785 Hyperlipidemia, unspecified: Secondary | ICD-10-CM

## 2015-01-14 DIAGNOSIS — F0151 Vascular dementia with behavioral disturbance: Secondary | ICD-10-CM | POA: Diagnosis not present

## 2015-01-14 DIAGNOSIS — F411 Generalized anxiety disorder: Secondary | ICD-10-CM

## 2015-01-14 DIAGNOSIS — F339 Major depressive disorder, recurrent, unspecified: Secondary | ICD-10-CM | POA: Insufficient documentation

## 2015-01-14 NOTE — Progress Notes (Signed)
Patient ID: [name] [name], female   DOB: 11/13/1940, 74 y.o.   MRN: [id number]    DATE: 01/14/15  Location:  [facility]    Place of Service: [id number] [phone])   Extended Emergency Contact Information Primary Emergency Contact: [name]) Address: [address]          [name] Phone: [phone] Relation: Son Secondary Emergency Contact: Charletta Cousin [place] Phone: [phone] Work Phone: [phone] Relation: Son  Advanced Directive information   DNR  Chief Complaint  Patient presents with  . Medical Management of Chronic Issues    HPI:  74 yo long term resident seen today for f/u. She c/o indigestion and used to take tagamet at home. No N/V. No change in bowel habits. She has epigastric discomfort. No nursing issues.  Dementia - stable on namzeric  Depression/anxiety/chronic back pain - pain stable on tramadol, lidoderm patch and gabapentin. She also takes cymbalta for mood. Benadryl helps itching  Chronic bronchitis - no exacerbations on advair and duonebs  Hyperlipidemia - takes pravastatin and fenofibrate. LDL above goal and needs <100.    DM - diet controlled. A1c 7.2% in April. Takes lisinopril for kidney protection. She also takes ASA daily  Osteoporosis - DXA pending. She takes Vit D  She takes vitamins and other supplements  Past Medical History  Diagnosis Date  . Depressive disorder, not elsewhere classified   . Chronic airway obstruction, not elsewhere classified   . Spinal stenosis   . Lumbago   . Esophageal reflux   . Anxiety state, unspecified   . Abnormality of gait   . Candidiasis of other urogenital sites   . Osteoporosis     Past Surgical History  Procedure Laterality Date  . Cholecystectomy    . Appendectomy    . Abdominal hysterectomy    . Vertebroplasty      Patient Care Team: Gildardo Cranker, DO as PCP - General (Internal Medicine)  History    Social History  . Marital Status: Divorced    Spouse Name: N/A  . Number of Children: N/A  . Years of Education: N/A   Occupational History  . Not on file.   Social History Main Topics  . Smoking status: Former Research scientist (life sciences)  . Smokeless tobacco: Not on file  . Alcohol Use: No  . Drug Use: No  . Sexual Activity: No   Other Topics Concern  . Not on file   Social History Narrative     reports that she has quit smoking. She does not have any smokeless tobacco history on file. She reports that she does not drink alcohol or use illicit drugs.  Immunization History  Administered Date(s) Administered  . Influenza Whole 05/22/2013  . Influenza-Unspecified 05/23/2014  . Pneumococcal-Unspecified 08/31/2011    Allergies  Allergen Reactions  . Avelox [Moxifloxacin Hcl In Nacl] Hives  . Codeine     Reports her throat swelled in the past but has tolerated since.  . Compazine [Prochlorperazine Edisylate] Nausea And Vomiting  . Morphine And Related     Patient doesn't recall  . Penicillins     Patient doesn't recall.  . Phenothiazines     Patient doesn't recall  . Prednisone     Insomnia, confusion.  Marland Kitchen Reglan [Metoclopramide]     Patient doesn't recall "but it was awful"  . Sulfonamide Derivatives Hives    Medications: Patient's Medications  New Prescriptions   No medications on file  Previous Medications   ASPIRIN EC 81 MG TABLET    Take 1 tablet (81 mg total) by mouth daily.   BENZOCAINE-RESORCINOL (VAGICREAM) 5-2 % VAGINAL CREAM    Place 1 application vaginally 2 (two) times daily as needed for itching (and/or burning).   CHOLECALCIFEROL (VITAMIN D) 2000 UNITS CAPS    Take 1 capsule by mouth daily. For vitamin D deficiency   DIPHENHYDRAMINE (SOMINEX) 25 MG TABLET    Take 25 mg by mouth every 6 (six) hours as needed for itching.    DULOXETINE (CYMBALTA) 60 MG CAPSULE    Take 60 mg by mouth daily. For depression   FENOFIBRATE (TRICOR) 48 MG TABLET    Take 1 tablet (48 mg  total) by mouth daily.   FLUTICASONE-SALMETEROL (ADVAIR) 250-50 MCG/DOSE AEPB    Inhale 1 puff into the lungs every 12 (twelve) hours. For COPD   GABAPENTIN (NEURONTIN) 100 MG CAPSULE    Take 1 capsule (100 mg total) by mouth 3 (three) times daily.   IPRATROPIUM-ALBUTEROL (DUONEB) 0.5-2.5 (3) MG/3ML SOLN    Take 3 mLs by nebulization every 6 (six) hours as needed (shortness of breath).   LIDOCAINE (LIDODERM) 5 %    Place 2 patches onto the skin every morning. Apply two patches to lower back. Remove & Discard patch after 12 hours.   LISINOPRIL (PRINIVIL,ZESTRIL) 5 MG TABLET    Take 0.5 tablets (2.5 mg total) by mouth daily.   MEMANTINE HCL-DONEPEZIL HCL (NAMZARIC) 28-10 MG CP24    Take 28 capsules by mouth daily. For dementia   POTASSIUM CHLORIDE SA (K-DUR,KLOR-CON) 20 MEQ TABLET    Take 40 mEq by mouth daily. Muscle weakness   PRAVASTATIN (PRAVACHOL) 20 MG TABLET    Take 20 mg by mouth daily. For hyperlipidemia   RANITIDINE (ZANTAC) 150 MG TABLET    Take 150 mg by mouth at bedtime. For reflux   TRAMADOL (ULTRAM) 50 MG TABLET    Take two tablets by mouth every 6 hours as needed for pain  Modified Medications   No medications on file  Discontinued Medications   No medications on file    Review of Systems  Unable to perform ROS: Dementia    Filed Vitals:   01/14/15 1841  BP: 124/76  Pulse: 67  Temp: 98 F (36.7 C)  Weight: 184 lb (83.462 kg)  SpO2: 98%   Body mass index is 33.65 kg/(m^2).  Physical Exam  Constitutional: She appears well-developed. No distress.  Lying in bed in NAD. frail appearing   HENT:  Mouth/Throat: Oropharynx is clear and moist. No oropharyngeal exudate.  Eyes: Pupils are equal, round, and reactive to light. No scleral icterus.  Neck: Neck supple. Carotid bruit is not present. No tracheal deviation present. No thyromegaly present.  Cardiovascular: Normal rate, regular rhythm, normal heart sounds and intact distal pulses.  Exam reveals no gallop and no  friction rub.   No murmur heard. No LE edema b/l. no calf TTP.   Pulmonary/Chest: Effort normal and breath sounds normal. No stridor. No respiratory distress. She has no wheezes. She has no rales.  Abdominal: Soft. Bowel sounds are normal. She exhibits no distension and no mass. There is no hepatomegaly. There is tenderness (epigastric). There is no rebound and no guarding.  Musculoskeletal: She exhibits edema and tenderness.  Lymphadenopathy:    She has no cervical adenopathy.  Neurological: She is alert.  Skin: Skin is warm and dry. No rash noted.  lidoderm patch on back  Psychiatric: She  has a normal mood and affect. Her behavior is normal.     Labs reviewed: No visits with results within 3 Month(s) from this visit. Latest known visit with results is: Lipid Panel     Component Value Date/Time   CHOL 203* 12/20/2014   TRIG 89 12/20/2014   HDL 62 12/20/2014   CHOLHDL 5.7 03/11/2011 0515   VLDL 60* 03/11/2011 0515   LDLCALC 124 12/20/2014    Lab Results  Component Value Date   HGBA1C 7.2* 12/07/2014    Nursing Home on 05/30/2014  Component Date Value Ref Range Status  . Glucose 04/25/2014 129   Final  . BUN 04/25/2014 14  4 - 21 mg/dL Final  . Creatinine 04/25/2014 0.6  0.5 - 1.1 mg/dL Final  . Potassium 04/25/2014 4.2  3.4 - 5.3 mmol/L Final  . Sodium 04/25/2014 142  137 - 147 mmol/L Final  . Triglycerides 04/25/2014 89  40 - 160 mg/dL Final  . Cholesterol 04/25/2014 195  0 - 200 mg/dL Final  . HDL 04/25/2014 53  35 - 70 mg/dL Final  . LDL Cholesterol 04/25/2014 124   Final  . Hgb A1c MFr Bld 04/25/2014 6.7* 4.0 - 6.0 % Final    No results found.   Assessment/Plan   ICD-9-CM ICD-10-CM   1. Gastroesophageal reflux disease without esophagitis - failing to change as expected 530.81 K21.9   2. Vascular dementia with paranoia - stable 290.42 F01.51     F22   3. COPD bronchitis - stable 491.20 J44.9   4. Diabetes mellitus type II, controlled, with no complications  - stable 782.95 E11.9   5. Hyperlipidemia associated with type 2 diabetes mellitus - stable 250.80 E11.69    272.4 E78.5   6. Anxiety state - stable 300.00 F41.1   7. Major depressive disorder, recurrent, in remission - stable 296.35 F33.40     --increase ranitidine 150mg  in AM and qhs  --continue other meds as ordered  --check CMP and TSH  --PT/OT/ST as indicated  --will follow  Indiah Heyden S. Perlie Gold  [facility] [address] [place], [place] [address] [phone] Cell (Monday-Friday 8 AM - 5 PM) [phone] After 5 PM and follow prompts

## 2015-02-14 ENCOUNTER — Encounter: Payer: Self-pay | Admitting: Adult Health

## 2015-02-14 ENCOUNTER — Non-Acute Institutional Stay (SKILLED_NURSING_FACILITY): Payer: Medicare Other | Admitting: Adult Health

## 2015-02-14 DIAGNOSIS — F3340 Major depressive disorder, recurrent, in remission, unspecified: Secondary | ICD-10-CM | POA: Diagnosis not present

## 2015-02-14 DIAGNOSIS — K219 Gastro-esophageal reflux disease without esophagitis: Secondary | ICD-10-CM | POA: Diagnosis not present

## 2015-02-14 DIAGNOSIS — E876 Hypokalemia: Secondary | ICD-10-CM

## 2015-02-14 DIAGNOSIS — F0151 Vascular dementia with behavioral disturbance: Secondary | ICD-10-CM | POA: Diagnosis not present

## 2015-02-14 DIAGNOSIS — E785 Hyperlipidemia, unspecified: Secondary | ICD-10-CM | POA: Diagnosis not present

## 2015-02-14 DIAGNOSIS — F0152 Vascular dementia, unspecified severity, with psychotic disturbance: Secondary | ICD-10-CM

## 2015-02-14 DIAGNOSIS — J449 Chronic obstructive pulmonary disease, unspecified: Secondary | ICD-10-CM | POA: Diagnosis not present

## 2015-02-14 DIAGNOSIS — M545 Low back pain: Secondary | ICD-10-CM

## 2015-02-14 DIAGNOSIS — F22 Delusional disorders: Secondary | ICD-10-CM

## 2015-02-14 DIAGNOSIS — E119 Type 2 diabetes mellitus without complications: Secondary | ICD-10-CM | POA: Diagnosis not present

## 2015-02-14 DIAGNOSIS — IMO0001 Reserved for inherently not codable concepts without codable children: Secondary | ICD-10-CM

## 2015-02-14 DIAGNOSIS — E1169 Type 2 diabetes mellitus with other specified complication: Secondary | ICD-10-CM | POA: Diagnosis not present

## 2015-02-14 DIAGNOSIS — F0150 Vascular dementia without behavioral disturbance: Secondary | ICD-10-CM

## 2015-02-14 MED ORDER — PRAVASTATIN SODIUM 20 MG PO TABS: 40.0000 mg | ORAL_TABLET | Freq: Every day | ORAL | Status: DC

## 2015-02-14 NOTE — Progress Notes (Signed)
Patient ID: [name] [name], female   DOB: [birth year], 74 y.o.   MRN: [id number]  [facility]     Allergies  Allergen Reactions  . Avelox [Moxifloxacin Hcl In Nacl] Hives  . Codeine     Reports her throat swelled in the past but has tolerated since.  . Compazine [Prochlorperazine Edisylate] Nausea And Vomiting  . Morphine And Related     Patient doesn't recall  . Penicillins     Patient doesn't recall.  . Phenothiazines     Patient doesn't recall  . Prednisone     Insomnia, confusion.  Marland Kitchen Reglan [Metoclopramide]     Patient doesn't recall "but it was awful"  . Sulfonamide Derivatives Hives       Chief Complaint  Patient presents with  . Medical Management of Chronic Issues    HPI:    Past Medical History  Diagnosis Date  . Depressive disorder, not elsewhere classified   . Chronic airway obstruction, not elsewhere classified   . Spinal stenosis   . Lumbago   . Esophageal reflux   . Anxiety state, unspecified   . Abnormality of gait   . Candidiasis of other urogenital sites   . Osteoporosis     Past Surgical History  Procedure Laterality Date  . Cholecystectomy    . Appendectomy    . Abdominal hysterectomy    . Vertebroplasty      VITAL SIGNS BP 112/61 mmHg  Pulse 69  Ht 5\' 2"  (1.575 m)  Wt 197 lb (89.359 kg)  BMI 36.02 kg/m2  SpO2 97%   Outpatient Encounter Prescriptions as of 02/14/2015  Medication Sig  . aspirin EC 81 MG tablet Take 1 tablet (81 mg total) by mouth daily.  . benzocaine-resorcinol (VAGICREAM) 5-2 % vaginal cream Place 1 application vaginally 2 (two) times daily as needed for itching (and/or burning).  . Cholecalciferol (VITAMIN D) 2000 UNITS CAPS Take 1 capsule by mouth daily. For vitamin D deficiency  . diphenhydrAMINE (SOMINEX) 25 MG tablet Take 25 mg by mouth every 6 (six) hours as needed for itching.   . DULoxetine (CYMBALTA) 60 MG capsule Take 60 mg by mouth daily. For depression  . fenofibrate (TRICOR) 48 MG tablet  Take 120 mg  by mouth daily. For increased trig)  . Fluticasone-Salmeterol (ADVAIR) 250-50 MCG/DOSE AEPB Inhale 1 puff into the lungs every 12 (twelve) hours. For COPD  . gabapentin (NEURONTIN) 100 MG capsule Take 1 capsule (100 mg total) by mouth 3 (three) times daily.  Marland Kitchen ipratropium-albuterol (DUONEB) 0.5-2.5 (3) MG/3ML SOLN Take 3 mLs by nebulization every 6 (six) hours as needed (shortness of breath).  . lidocaine (LIDODERM) 5 % Place 2 patches onto the skin every morning. Apply two patches to lower back. Remove & Discard patch after 12 hours.  Marland Kitchen lisinopril (PRINIVIL,ZESTRIL) 5 MG tablet Take 0.5 tablets (2.5 mg total) by mouth daily.  . Memantine HCl-Donepezil HCl (NAMZARIC) 28-10 MG CP24 Take 28 capsules by mouth daily. For dementia  . potassium chloride SA (K-DUR,KLOR-CON) 20 MEQ tablet Take 40 mEq by mouth daily. Muscle weakness  . pravastatin (PRAVACHOL) 20 MG tablet Take 20 mg by mouth daily. For hyperlipidemia  . ranitidine (ZANTAC) 150 MG tablet Take 150 mg by mouth 2 (two) times daily. For reflux  . traMADol (ULTRAM) 50 MG tablet Take two tablets by mouth every 6 hours as needed for pain      SIGNIFICANT DIAGNOSTIC EXAMS 2016: declined mammogram and dexa     LABS REVIEWED:  04-25-14: glucose 129'; bun 14.4; creat 0.65; k+4.2; na++142; hgb a1c 6.7 08-24-14:glucose 151; bun 17.9; creat 0.6; k+ 4.2; na++139; liver normal albumin 4.0; hgb a1c 7.0  chol 180; ldl 83; trig 218; hdl 54  10-01-14: wbc 10.2; hgb 12.7; hct 43.9; mcv 93.5; plt 329; glucose 135; bun 13.4; creat 0.67; k+4.4; na++141; liver normal albumin 3.6; chol 181; ldl 103; trig 153; hdl 48  10-04-14: urine for micro-albumin: 1.3 11-08-14: chol 181; dl 103; trig 153; hdl 48; liver normal albumin 3.6 12-06-13: hgb a1c 7.2  12-20-14: chol 203; ldl 124; trig 89; hdl 62       Review of Systems  Constitutional: Negative for malaise/fatigue.  Respiratory: Negative for cough and shortness of breath.   Cardiovascular: Negative for chest  pain, palpitations and leg swelling.  Gastrointestinal: Negative for heartburn, abdominal pain and constipation.  Musculoskeletal: Negative for myalgias and back pain.  Skin: Negative.   Neurological: Negative for weakness.  Psychiatric/Behavioral: Negative for depression. The patient has insomnia.      Physical Exam  Constitutional: She is oriented to person, place, and time. She appears well-developed and well-nourished. No distress.  Over weight  Neck: Neck supple. No JVD present. No thyromegaly present.  Cardiovascular: Normal rate, regular rhythm and intact distal pulses.   Respiratory: Effort normal and breath sounds normal. No respiratory distress.  GI: Soft. Bowel sounds are normal. She exhibits no distension. There is no tenderness.  Musculoskeletal: Normal range of motion. She exhibits no edema.  Neurological: She is alert and oriented to person, place, and time.  Skin: Skin is warm and dry. She is not diaphoretic.       ASSESSMENT/ PLAN:  1. COPD: she is stable will continue advair 250/50 twice daily; and duoneb every 6 hours as needed will monitor  2. Dyslipidemia: will increase pravachol to 40 mg daily will continue fenofibrate 120  mg daily ldl is 123; trig 89   3. Hypokalemia: will continue k+ 40 meq daily  4. Vascular dementia: without change in her status; will continue namzaric 28-10 mg daily and will monitor   5. Gerd: will continue zantac 150 mg twice daily   6. Chronic low back pain: she is presently stable; will continue lidoderm 2 patches to lower back daily; neurontin 100 mg three times daily; cymbalta 60 mg daily and ultram 100 mg every 6 hours as needed and will monitor her status.   7. Anxiety: she is stable and is benefiting from cymbalta 60 mg daily will not make changes will monitor   8. Psychosis: her risperdal has been stopped; will continue to monitor her status.   9. Diabetes: is presently not on medications; her hgb a1c is 7.2.  will  continue lisinopril 2.5 mg daily and asa 81 mg daily       Ok Edwards NP [facility]  Contact [phone] Monday through Friday 8am- 5pm  After hours call [phone]

## 2015-03-19 ENCOUNTER — Encounter: Payer: Self-pay | Admitting: Adult Health

## 2015-03-19 ENCOUNTER — Non-Acute Institutional Stay (SKILLED_NURSING_FACILITY): Payer: Medicare Other | Admitting: Adult Health

## 2015-03-19 DIAGNOSIS — F22 Delusional disorders: Secondary | ICD-10-CM

## 2015-03-19 DIAGNOSIS — F0151 Vascular dementia with behavioral disturbance: Secondary | ICD-10-CM | POA: Diagnosis not present

## 2015-03-19 DIAGNOSIS — IMO0001 Reserved for inherently not codable concepts without codable children: Secondary | ICD-10-CM

## 2015-03-19 DIAGNOSIS — E876 Hypokalemia: Secondary | ICD-10-CM

## 2015-03-19 DIAGNOSIS — E119 Type 2 diabetes mellitus without complications: Secondary | ICD-10-CM | POA: Diagnosis not present

## 2015-03-19 DIAGNOSIS — F29 Unspecified psychosis not due to a substance or known physiological condition: Secondary | ICD-10-CM

## 2015-03-19 DIAGNOSIS — E1169 Type 2 diabetes mellitus with other specified complication: Secondary | ICD-10-CM | POA: Diagnosis not present

## 2015-03-19 DIAGNOSIS — E785 Hyperlipidemia, unspecified: Secondary | ICD-10-CM

## 2015-03-19 DIAGNOSIS — M545 Low back pain: Secondary | ICD-10-CM | POA: Diagnosis not present

## 2015-03-19 DIAGNOSIS — F0150 Vascular dementia without behavioral disturbance: Secondary | ICD-10-CM

## 2015-03-19 DIAGNOSIS — J449 Chronic obstructive pulmonary disease, unspecified: Secondary | ICD-10-CM

## 2015-03-19 DIAGNOSIS — F0152 Vascular dementia, unspecified severity, with psychotic disturbance: Secondary | ICD-10-CM

## 2015-03-19 NOTE — Progress Notes (Signed)
Patient ID: [name] [name], female   DOB: 1941-04-17, 74 y.o.   MRN: [id number]   Facility: [facility]      Allergies  Allergen Reactions  . Avelox [Moxifloxacin Hcl In Nacl] Hives  . Codeine     Reports her throat swelled in the past but has tolerated since.  . Compazine [Prochlorperazine Edisylate] Nausea And Vomiting  . Morphine And Related     Patient doesn't recall  . Penicillins     Patient doesn't recall.  . Phenothiazines     Patient doesn't recall  . Prednisone     Insomnia, confusion.  Marland Kitchen Reglan [Metoclopramide]     Patient doesn't recall "but it was awful"  . Sulfonamide Derivatives Hives    Chief Complaint  Patient presents with  . Medical Management of Chronic Issues    HPI:  She is a long term resident of this facility being seen for the management of her chronic illnesses. Overall her status is stable. She states that she is feeling good and is not voicing any concerns. There are no nursing concerns at this time.   Past Medical History  Diagnosis Date  . Depressive disorder, not elsewhere classified   . Chronic airway obstruction, not elsewhere classified   . Spinal stenosis   . Lumbago   . Esophageal reflux   . Anxiety state, unspecified   . Abnormality of gait   . Candidiasis of other urogenital sites   . Osteoporosis     Past Surgical History  Procedure Laterality Date  . Cholecystectomy    . Appendectomy    . Abdominal hysterectomy    . Vertebroplasty      VITAL SIGNS BP 130/55 mmHg  Pulse 69  Ht 5\' 2"  (1.575 m)  Wt 198 lb (89.812 kg)  BMI 36.21 kg/m2  SpO2 96%  Patient's Medications  New Prescriptions   No medications on file  Previous Medications   ASPIRIN EC 81 MG TABLET    Take 1 tablet (81 mg total) by mouth daily.   BENZOCAINE-RESORCINOL (VAGICREAM) 5-2 % VAGINAL CREAM    Place 1 application vaginally 2 (two) times daily as needed for itching (and/or burning).   CHOLECALCIFEROL (VITAMIN D) 2000 UNITS CAPS     Take 1 capsule by mouth daily. For vitamin D deficiency   DIPHENHYDRAMINE (SOMINEX) 25 MG TABLET    Take 25 mg by mouth every 6 (six) hours as needed for itching.    DULOXETINE (CYMBALTA) 60 MG CAPSULE    Take 60 mg by mouth daily. For depression   FENOFIBRATE (TRICOR) 48 MG TABLET    Take 1 tablet (48 mg total) by mouth daily.   FLUTICASONE-SALMETEROL (ADVAIR) 250-50 MCG/DOSE AEPB    Inhale 1 puff into the lungs every 12 (twelve) hours. For COPD   GABAPENTIN (NEURONTIN) 100 MG CAPSULE    Take 1 capsule (100 mg total) by mouth 3 (three) times daily.   IPRATROPIUM-ALBUTEROL (DUONEB) 0.5-2.5 (3) MG/3ML SOLN    Take 3 mLs by nebulization every 6 (six) hours as needed (shortness of breath).   LIDOCAINE (LIDODERM) 5 %    Place 2 patches onto the skin every morning. Apply two patches to lower back. Remove & Discard patch after 12 hours.   LISINOPRIL (PRINIVIL,ZESTRIL) 5 MG TABLET    Take 0.5 tablets (2.5 mg total) by mouth daily.   MEMANTINE HCL-DONEPEZIL HCL (NAMZARIC) 28-10 MG CP24    Take 28 capsules by mouth daily. For dementia   POTASSIUM CHLORIDE SA (  K-DUR,KLOR-CON) 20 MEQ TABLET    Take 40 mEq by mouth daily. Muscle weakness   PRAVASTATIN (PRAVACHOL) 20 MG TABLET    Take 2 tablets (40 mg total) by mouth daily. For hyperlipidemia   RANITIDINE (ZANTAC) 150 MG TABLET    Take 150 mg by mouth 2 (two) times daily. For reflux   TRAMADOL (ULTRAM) 50 MG TABLET    Take two tablets by mouth every 6 hours as needed for pain  Modified Medications   No medications on file  Discontinued Medications   No medications on file     SIGNIFICANT DIAGNOSTIC EXAMS  2016: declined mammogram and dexa     LABS REVIEWED:   04-25-14: glucose 129'; bun 14.4; creat 0.65; k+4.2; na++142; hgb a1c 6.7 08-24-14:glucose 151; bun 17.9; creat 0.6; k+ 4.2; na++139; liver normal albumin 4.0; hgb a1c 7.0  chol 180; ldl 83; trig 218; hdl 54  10-01-14: wbc 10.2; hgb 12.7; hct 43.9; mcv 93.5; plt 329; glucose 135; bun 13.4; creat  0.67; k+4.4; na++141; liver normal albumin 3.6; chol 181; ldl 103; trig 153; hdl 48  10-04-14: urine for micro-albumin: 1.3 11-08-14: chol 181; dl 103; trig 153; hdl 48; liver normal albumin 3.6 12-06-13: hgb a1c 7.2  12-20-14: chol 203; ldl 124; trig 89; hdl 62      Review of Systems  Constitutional: Negative for appetite change and fatigue.  HENT: Negative for congestion.   Respiratory: Negative for cough, chest tightness and shortness of breath.   Cardiovascular: Negative for chest pain, palpitations and leg swelling.  Gastrointestinal: Negative for nausea, abdominal pain, diarrhea and constipation.  Musculoskeletal: Negative for myalgias and arthralgias.  Skin: Negative for pallor.  Neurological: Negative for dizziness.  Psychiatric/Behavioral: The patient is not nervous/anxious.       Physical Exam  Constitutional: She is oriented to person, place, and time. No distress.  Overweight   Eyes: Conjunctivae are normal.  Neck: Neck supple. No JVD present. No thyromegaly present.  Cardiovascular: Normal rate, regular rhythm and intact distal pulses.   Respiratory: Effort normal and breath sounds normal. No respiratory distress. She has no wheezes.  GI: Soft. Bowel sounds are normal. She exhibits no distension. There is no tenderness.  Musculoskeletal: She exhibits no edema.  Able to move all extremities   Lymphadenopathy:    She has no cervical adenopathy.  Neurological: She is alert and oriented to person, place, and time.  Skin: Skin is warm and dry. She is not diaphoretic.  Psychiatric: She has a normal mood and affect.       ASSESSMENT/ PLAN:  1. COPD: she is stable will continue advair 250/50 twice daily; and duoneb every 6 hours as needed will monitor  2. Dyslipidemia: will increase pravachol to 40 mg daily will continue fenofibrate 120  mg daily ldl is 123; trig 89   3. Hypokalemia: will continue k+ 40 meq daily  4. Vascular dementia: without change in her status;  will continue namzaric 28-10 mg daily and will monitor   5. Gerd: will continue zantac 150 mg twice daily   6. Chronic low back pain: she is presently stable; will continue lidoderm 2 patches to lower back daily; neurontin 100 mg three times daily; cymbalta 60 mg daily and ultram 100 mg every 6 hours as needed and will monitor her status.   7. Anxiety: she is stable and is benefiting from cymbalta 60 mg daily will not make changes will monitor   8. Psychosis: her risperdal has been stopped; will continue  to monitor her status.   9. Diabetes: is presently not on medications; her hgb a1c is 7.2.  will continue lisinopril 2.5 mg daily and asa 81 mg daily   Will check cbc; cmp   Ok Edwards NP [facility]  Contact [phone] Monday through Friday 8am- 5pm  After hours call [phone]

## 2015-04-18 ENCOUNTER — Non-Acute Institutional Stay (SKILLED_NURSING_FACILITY): Payer: Medicare Other | Admitting: Adult Health

## 2015-04-18 DIAGNOSIS — E1169 Type 2 diabetes mellitus with other specified complication: Secondary | ICD-10-CM | POA: Diagnosis not present

## 2015-04-18 DIAGNOSIS — J449 Chronic obstructive pulmonary disease, unspecified: Secondary | ICD-10-CM

## 2015-04-18 DIAGNOSIS — F29 Unspecified psychosis not due to a substance or known physiological condition: Secondary | ICD-10-CM | POA: Diagnosis not present

## 2015-04-18 DIAGNOSIS — M545 Low back pain: Secondary | ICD-10-CM | POA: Diagnosis not present

## 2015-04-18 DIAGNOSIS — F22 Delusional disorders: Secondary | ICD-10-CM

## 2015-04-18 DIAGNOSIS — F3340 Major depressive disorder, recurrent, in remission, unspecified: Secondary | ICD-10-CM | POA: Diagnosis not present

## 2015-04-18 DIAGNOSIS — IMO0001 Reserved for inherently not codable concepts without codable children: Secondary | ICD-10-CM

## 2015-04-18 DIAGNOSIS — F0151 Vascular dementia with behavioral disturbance: Secondary | ICD-10-CM | POA: Diagnosis not present

## 2015-04-18 DIAGNOSIS — E876 Hypokalemia: Secondary | ICD-10-CM

## 2015-04-18 DIAGNOSIS — E785 Hyperlipidemia, unspecified: Secondary | ICD-10-CM

## 2015-04-18 DIAGNOSIS — E119 Type 2 diabetes mellitus without complications: Secondary | ICD-10-CM

## 2015-04-18 DIAGNOSIS — F0152 Vascular dementia, unspecified severity, with psychotic disturbance: Secondary | ICD-10-CM

## 2015-04-18 DIAGNOSIS — F0150 Vascular dementia without behavioral disturbance: Secondary | ICD-10-CM

## 2015-04-20 LAB — LIPID PANEL
Cholesterol: 160 mg/dL (ref 0–200)
HDL: 55 mg/dL (ref 35–70)
LDL CALC: 81 mg/dL
Triglycerides: 123 mg/dL (ref 40–160)

## 2015-05-08 ENCOUNTER — Encounter: Payer: Self-pay | Admitting: Adult Health

## 2015-05-08 NOTE — Progress Notes (Signed)
Patient ID: [name] [name], female   DOB: Jan 16, 1941, 74 y.o.   MRN: [id number]    Facility: [facility]      Allergies  Allergen Reactions  . Avelox [Moxifloxacin Hcl In Nacl] Hives  . Codeine     Reports her throat swelled in the past but has tolerated since.  . Compazine [Prochlorperazine Edisylate] Nausea And Vomiting  . Morphine And Related     Patient doesn't recall  . Penicillins     Patient doesn't recall.  . Phenothiazines     Patient doesn't recall  . Prednisone     Insomnia, confusion.  Marland Kitchen Reglan [Metoclopramide]     Patient doesn't recall "but it was awful"  . Sulfonamide Derivatives Hives    Chief Complaint  Patient presents with  . Medical Management of Chronic Issues    HPI:  She is a long term resident of this facility being seen for the management of her chronic illnesses. Overall her status is stable. She is not voicing any complaints or concerns. She states that she is feeling good. There are no nursing concerns at this time.    Past Medical History  Diagnosis Date  . Depressive disorder, not elsewhere classified   . Chronic airway obstruction, not elsewhere classified   . Spinal stenosis   . Lumbago   . Esophageal reflux   . Anxiety state, unspecified   . Abnormality of gait   . Candidiasis of other urogenital sites   . Osteoporosis     Past Surgical History  Procedure Laterality Date  . Cholecystectomy    . Appendectomy    . Abdominal hysterectomy    . Vertebroplasty      VITAL SIGNS BP 114/74 mmHg  Pulse 78  Ht 5\' 2"  (1.575 m)  Wt 198 lb (89.812 kg)  BMI 36.21 kg/m2  SpO2 97%  Patient's Medications  New Prescriptions   No medications on file  Previous Medications   ASPIRIN EC 81 MG TABLET    Take 1 tablet (81 mg total) by mouth daily.   BENZOCAINE-RESORCINOL (VAGICREAM) 5-2 % VAGINAL CREAM    Place 1 application vaginally 2 (two) times daily as needed for itching (and/or burning).   CHOLECALCIFEROL (VITAMIN D)  2000 UNITS CAPS    Take 1 capsule by mouth daily. For vitamin D deficiency   DIPHENHYDRAMINE (SOMINEX) 25 MG TABLET    Take 25 mg by mouth every 6 (six) hours as needed for itching.    DULOXETINE (CYMBALTA) 60 MG CAPSULE    Take 60 mg by mouth daily. For depression   FENOFIBRATE (TRICOR) 48 MG TABLET    Take 1 tablet (48 mg total) by mouth daily.   FLUTICASONE-SALMETEROL (ADVAIR) 250-50 MCG/DOSE AEPB    Inhale 1 puff into the lungs every 12 (twelve) hours. For COPD   GABAPENTIN (NEURONTIN) 100 MG CAPSULE    Take 1 capsule (100 mg total) by mouth 3 (three) times daily.   IPRATROPIUM-ALBUTEROL (DUONEB) 0.5-2.5 (3) MG/3ML SOLN    Take 3 mLs by nebulization every 6 (six) hours as needed (shortness of breath).   LIDOCAINE (LIDODERM) 5 %    Place 2 patches onto the skin every morning. Apply two patches to lower back. Remove & Discard patch after 12 hours.   LISINOPRIL (PRINIVIL,ZESTRIL) 5 MG TABLET    Take 0.5 tablets (2.5 mg total) by mouth daily.   MEMANTINE HCL-DONEPEZIL HCL (NAMZARIC) 28-10 MG CP24    Take 28 capsules by mouth daily. For dementia  POTASSIUM CHLORIDE SA (K-DUR,KLOR-CON) 20 MEQ TABLET    Take 40 mEq by mouth daily. Muscle weakness   PRAVASTATIN (PRAVACHOL) 20 MG TABLET    Take 2 tablets (40 mg total) by mouth daily. For hyperlipidemia   RANITIDINE (ZANTAC) 150 MG TABLET    Take 150 mg by mouth 2 (two) times daily. For reflux   TRAMADOL (ULTRAM) 50 MG TABLET    Take two tablets by mouth every 6 hours as needed for pain  Modified Medications   No medications on file  Discontinued Medications   No medications on file     SIGNIFICANT DIAGNOSTIC EXAMS  2016: declined mammogram and dexa     LABS REVIEWED:    08-24-14:glucose 151; bun 17.9; creat 0.6; k+ 4.2; na++139; liver normal albumin 4.0; hgb a1c 7.0  chol 180; ldl 83; trig 218; hdl 54  10-01-14: wbc 10.2; hgb 12.7; hct 43.9; mcv 93.5; plt 329; glucose 135; bun 13.4; creat 0.67; k+4.4; na++141; liver normal albumin 3.6; chol  181; ldl 103; trig 153; hdl 48  10-04-14: urine for micro-albumin: 1.3 11-08-14: chol 181; ldl 103; trig 153; hdl 48; liver normal albumin 3.6 12-06-13: hgb a1c 7.2  12-20-14: chol 203; ldl 124; trig 89; hdl 62   03-20-15: wbc 8.7; hgb 12.8; hct 42.9; mcv 93; plt 365; glucose 121; bun 28.9; creat 0.64; k+ 4.9; na++143; liver normal albumin 3.7      Review of Systems Constitutional: Negative for appetite change and fatigue.  HENT: Negative for congestion.   Respiratory: Negative for cough, chest tightness and shortness of breath.   Cardiovascular: Negative for chest pain, palpitations and leg swelling.  Gastrointestinal: Negative for nausea, abdominal pain, diarrhea and constipation.  Musculoskeletal: Negative for myalgias and arthralgias.  Skin: Negative for pallor.  Neurological: Negative for dizziness.  Psychiatric/Behavioral: The patient is not nervous/anxious.        Physical Exam Constitutional: She is oriented to person, place, and time. No distress.  Overweight   Eyes: Conjunctivae are normal.  Neck: Neck supple. No JVD present. No thyromegaly present.  Cardiovascular: Normal rate, regular rhythm and intact distal pulses.   Respiratory: Effort normal and breath sounds normal. No respiratory distress. She has no wheezes.  GI: Soft. Bowel sounds are normal. She exhibits no distension. There is no tenderness.  Musculoskeletal: She exhibits no edema.  Able to move all extremities   Lymphadenopathy:    She has no cervical adenopathy.  Neurological: She is alert and oriented to person, place, and time.  Skin: Skin is warm and dry. She is not diaphoretic.  Psychiatric: She has a normal mood and affect.      ASSESSMENT/ PLAN:  1. COPD: she is stable will continue advair 250/50 twice daily; and duoneb every 6 hours as needed will monitor  2. Dyslipidemia: will continue pravachol  40 mg daily will continue fenofibrate 120  mg daily ldl is 124; trig 89   3. Hypokalemia: will  continue k+ 40 meq daily  4. Vascular dementia: without change in her status; will continue namzaric 28-10 mg daily and will monitor   5. Gerd: will continue zantac 150 mg twice daily   6. Chronic low back pain: she is presently stable; will continue lidoderm 2 patches to lower back daily; neurontin 100 mg three times daily; cymbalta 60 mg daily and ultram 100 mg every 6 hours as needed and will monitor her status.   7. Anxiety: she is stable and is benefiting from cymbalta 60 mg daily will  not make changes will monitor   8. Psychosis: her risperdal has been stopped; will continue to monitor her status.   9. Diabetes: is presently not on medications; her hgb a1c is 7.2.  will continue lisinopril 2.5 mg daily and asa 81 mg daily  Will lower cbg's to three times weekly  Will check hgb a1c      Ok Edwards NP [facility]  Contact [phone] Monday through Friday 8am- 5pm  After hours call [phone]

## 2015-05-30 ENCOUNTER — Non-Acute Institutional Stay (SKILLED_NURSING_FACILITY): Payer: Medicare Other | Admitting: Adult Health

## 2015-05-30 DIAGNOSIS — E876 Hypokalemia: Secondary | ICD-10-CM | POA: Diagnosis not present

## 2015-05-30 DIAGNOSIS — F339 Major depressive disorder, recurrent, unspecified: Secondary | ICD-10-CM | POA: Diagnosis not present

## 2015-05-30 DIAGNOSIS — J449 Chronic obstructive pulmonary disease, unspecified: Secondary | ICD-10-CM | POA: Diagnosis not present

## 2015-05-30 DIAGNOSIS — F0152 Vascular dementia, unspecified severity, with psychotic disturbance: Secondary | ICD-10-CM

## 2015-05-30 DIAGNOSIS — F0151 Vascular dementia with behavioral disturbance: Secondary | ICD-10-CM | POA: Diagnosis not present

## 2015-05-30 DIAGNOSIS — E119 Type 2 diabetes mellitus without complications: Secondary | ICD-10-CM | POA: Diagnosis not present

## 2015-05-30 DIAGNOSIS — E1169 Type 2 diabetes mellitus with other specified complication: Secondary | ICD-10-CM

## 2015-05-30 DIAGNOSIS — K219 Gastro-esophageal reflux disease without esophagitis: Secondary | ICD-10-CM | POA: Diagnosis not present

## 2015-05-30 DIAGNOSIS — M545 Low back pain: Secondary | ICD-10-CM | POA: Diagnosis not present

## 2015-05-30 DIAGNOSIS — E785 Hyperlipidemia, unspecified: Secondary | ICD-10-CM | POA: Diagnosis not present

## 2015-05-30 DIAGNOSIS — IMO0001 Reserved for inherently not codable concepts without codable children: Secondary | ICD-10-CM

## 2015-05-30 DIAGNOSIS — F22 Delusional disorders: Secondary | ICD-10-CM

## 2015-05-30 DIAGNOSIS — F0150 Vascular dementia without behavioral disturbance: Secondary | ICD-10-CM

## 2015-06-09 ENCOUNTER — Encounter: Payer: Self-pay | Admitting: Adult Health

## 2015-06-09 NOTE — Progress Notes (Signed)
Patient ID: [name] [name], female   DOB: 28-Nov-1940, 74 y.o.   MRN: [id number]    Facility: [facility]      Allergies  Allergen Reactions  . Avelox [Moxifloxacin Hcl In Nacl] Hives  . Codeine     Reports her throat swelled in the past but has tolerated since.  . Compazine [Prochlorperazine Edisylate] Nausea And Vomiting  . Morphine And Related     Patient doesn't recall  . Penicillins     Patient doesn't recall.  . Phenothiazines     Patient doesn't recall  . Prednisone     Insomnia, confusion.  Marland Kitchen Reglan [Metoclopramide]     Patient doesn't recall "but it was awful"  . Sulfonamide Derivatives Hives    Chief Complaint  Patient presents with  . Medical Management of Chronic Issues    HPI:  She is a long term resident of this facility being seen for the management of her chronic illnesses.  Overall she is doing well. She is not voicing any complaints or concerns stating that she is feeling good. There are no nursing concerns at this time.    Past Medical History  Diagnosis Date  . Depressive disorder, not elsewhere classified   . Chronic airway obstruction, not elsewhere classified   . Spinal stenosis   . Lumbago   . Esophageal reflux   . Anxiety state, unspecified   . Abnormality of gait   . Candidiasis of other urogenital sites   . Osteoporosis     Past Surgical History  Procedure Laterality Date  . Cholecystectomy    . Appendectomy    . Abdominal hysterectomy    . Vertebroplasty      VITAL SIGNS BP 120/62 mmHg  Pulse 82  Ht 5\' 2"  (1.575 m)  Wt 200 lb (90.719 kg)  BMI 36.57 kg/m2  SpO2 98%  Patient's Medications  New Prescriptions   No medications on file  Previous Medications   ASPIRIN EC 81 MG TABLET    Take 1 tablet (81 mg total) by mouth daily.   BENZOCAINE-RESORCINOL (VAGICREAM) 5-2 % VAGINAL CREAM    Place 1 application vaginally 2 (two) times daily as needed for itching (and/or burning).   CHOLECALCIFEROL (VITAMIN D) 2000  UNITS CAPS    Take 1 capsule by mouth daily. For vitamin D deficiency   DIPHENHYDRAMINE (SOMINEX) 25 MG TABLET    Take 25 mg by mouth every 6 (six) hours as needed for itching.    DULOXETINE (CYMBALTA) 60 MG CAPSULE    Take 60 mg by mouth daily. For depression   FLUTICASONE-SALMETEROL (ADVAIR) 250-50 MCG/DOSE AEPB    Inhale 1 puff into the lungs every 12 (twelve) hours. For COPD   GABAPENTIN (NEURONTIN) 100 MG CAPSULE    Take 1 capsule (100 mg total) by mouth 3 (three) times daily.   GEMFIBROZIL (LOPID) 600 MG TABLET    Take 600 mg by mouth 2 (two) times daily before a meal.   IPRATROPIUM-ALBUTEROL (DUONEB) 0.5-2.5 (3) MG/3ML SOLN    Take 3 mLs by nebulization every 6 (six) hours as needed (shortness of breath).   LIDOCAINE (LIDODERM) 5 %    Place 2 patches onto the skin every morning. Apply two patches to lower back. Remove & Discard patch after 12 hours.   LISINOPRIL (PRINIVIL,ZESTRIL) 5 MG TABLET    Take 0.5 tablets (2.5 mg total) by mouth daily.   MEMANTINE HCL-DONEPEZIL HCL (NAMZARIC) 28-10 MG CP24    Take 28 capsules by mouth daily.  For dementia   POTASSIUM CHLORIDE SA (K-DUR,KLOR-CON) 20 MEQ TABLET    Take 40 mEq by mouth daily. Muscle weakness   PRAVASTATIN (PRAVACHOL) 20 MG TABLET    Take 2 tablets (40 mg total) by mouth daily. For hyperlipidemia   RANITIDINE (ZANTAC) 150 MG TABLET    Take 150 mg by mouth 2 (two) times daily. For reflux   TRAMADOL (ULTRAM) 50 MG TABLET    Take two tablets by mouth every 6 hours as needed for pain  Modified Medications   No medications on file  Discontinued Medications   FENOFIBRATE (TRICOR) 48 MG TABLET    Take 1 tablet (48 mg total) by mouth daily.     SIGNIFICANT DIAGNOSTIC [id number]: declined mammogram and dexa   LABS REVIEWED:   08-24-14:glucose 151; bun 17.9; creat 0.6; k+ 4.2; na++139; liver normal albumin 4.0; hgb a1c 7.0  chol 180; ldl 83; trig 218; hdl 54  10-01-14: wbc 10.2; hgb 12.7; hct 43.9; mcv 93.5; plt 329; glucose 135; bun 13.4;  creat 0.67; k+4.4; na++141; liver normal albumin 3.6; chol 181; ldl 103; trig 153; hdl 48  10-04-14: urine for micro-albumin: 1.3 11-08-14: chol 181; ldl 103; trig 153; hdl 48; liver normal albumin 3.6 12-06-13: hgb a1c 7.2  12-20-14: chol 203; ldl 124; trig 89; hdl 62   03-20-15: wbc 8.7; hgb 12.8; hct 42.9; mcv 93; plt 365; glucose 121; bun 28.9; creat 0.64; k+ 4.9; na++143; liver normal albumin 3.7  04-20-15: chol 160; ldl 81; trig 123; hdl 55; hgb a1c 7.6      Review of Systems Constitutional: Negative for appetite change and fatigue.  HENT: Negative for congestion.   Respiratory: Negative for cough, chest tightness and shortness of breath.   Cardiovascular: Negative for chest pain, palpitations and leg swelling.  Gastrointestinal: Negative for nausea, abdominal pain, diarrhea and constipation.  Musculoskeletal: Negative for myalgias and arthralgias.  Skin: Negative for pallor.  Neurological: Negative for dizziness.  Psychiatric/Behavioral: The patient is not nervous/anxious.       Physical Exam Constitutional: She is oriented to person, place, and time. No distress.  Overweight   Eyes: Conjunctivae are normal.  Neck: Neck supple. No JVD present. No thyromegaly present.  Cardiovascular: Normal rate, regular rhythm and intact distal pulses.   Respiratory: Effort normal and breath sounds normal. No respiratory distress. She has no wheezes.  GI: Soft. Bowel sounds are normal. She exhibits no distension. There is no tenderness.  Musculoskeletal: She exhibits no edema.  Able to move all extremities   Lymphadenopathy:    She has no cervical adenopathy.  Neurological: She is alert and oriented to person, place, and time.  Skin: Skin is warm and dry. She is not diaphoretic.  Psychiatric: She has a normal mood and affect.       ASSESSMENT/ PLAN:  1. COPD: she is stable will continue advair 250/50 twice daily; and duoneb every 6 hours as needed will monitor  2. Dyslipidemia: will  continue pravachol  40 mg daily will continue lopid 600 mg twice daily ldl is 81; trig 55   3. Hypokalemia: will continue k+ 40 meq daily  4. Vascular dementia: without change in her status; will continue namzaric 28-10 mg daily and will monitor   5. Gerd: will continue zantac 150 mg twice daily   6. Chronic low back pain: she is presently stable; will continue lidoderm 2 patches to lower back daily; neurontin 100 mg three times daily; cymbalta 60 mg daily and ultram 100 mg every  6 hours as needed and will monitor her status.   7. Anxiety: she is stable and is benefiting from cymbalta 60 mg daily will not make changes will monitor   8. Psychosis: her risperdal has been stopped; is no longer on medications; will continue to monitor her status.   9. Diabetes: is presently not on medications; her hgb a1c is 7.6.  will continue lisinopril 2.5 mg daily and asa 81 mg daily   Will not make changes; may need to consider starting medication in the future if her a1c continues to climb     Ok Edwards NP [place] [phone] Monday through Friday 8am- 5pm  After hours call [phone]

## 2015-07-01 ENCOUNTER — Encounter: Payer: Self-pay | Admitting: Internal Medicine

## 2015-07-01 ENCOUNTER — Non-Acute Institutional Stay (SKILLED_NURSING_FACILITY): Payer: Medicare Other | Admitting: Internal Medicine

## 2015-07-01 DIAGNOSIS — E119 Type 2 diabetes mellitus without complications: Secondary | ICD-10-CM

## 2015-07-01 DIAGNOSIS — F22 Delusional disorders: Secondary | ICD-10-CM | POA: Diagnosis not present

## 2015-07-01 DIAGNOSIS — F0152 Vascular dementia, unspecified severity, with psychotic disturbance: Secondary | ICD-10-CM

## 2015-07-01 DIAGNOSIS — E1169 Type 2 diabetes mellitus with other specified complication: Secondary | ICD-10-CM | POA: Diagnosis not present

## 2015-07-01 DIAGNOSIS — F0150 Vascular dementia without behavioral disturbance: Secondary | ICD-10-CM

## 2015-07-01 DIAGNOSIS — F0151 Vascular dementia with behavioral disturbance: Secondary | ICD-10-CM

## 2015-07-01 DIAGNOSIS — E785 Hyperlipidemia, unspecified: Secondary | ICD-10-CM | POA: Diagnosis not present

## 2015-07-01 NOTE — Progress Notes (Signed)
MRN: [id number] Name: [name] [name]  Sex: female Age: 74 y.o. DOB: 07/15/1941  [place] #: Karren Burly Facility/Room: Level Of Care: SNF Provider: Inocencio Homes D Emergency Contacts: Extended Emergency Contact Information Primary Emergency Contact: Blanche East) Address: [address]          [name] Phone: [phone] Relation: Son Secondary Emergency Contact: Charletta Cousin [place] Phone: [phone] Work Phone: [phone] Relation: Son  Code Status:   Allergies: Avelox; Codeine; Compazine; Morphine and related; Penicillins; Phenothiazines; Prednisone; Reglan; and Sulfonamide derivatives  Chief Complaint  Patient presents with  . Medical Management of Chronic Issues    HPI: Patient is 74 y.o. female with depression, COPD, GERD, anxiety ostoporosis dementia and DM2 who is being seen for routine issues of Demntia, HLD and DM2.  Past Medical History  Diagnosis Date  . Depressive disorder, not elsewhere classified   . Chronic airway obstruction, not elsewhere classified   . Spinal stenosis   . Lumbago   . Esophageal reflux   . Anxiety state, unspecified   . Abnormality of gait   . Candidiasis of other urogenital sites   . Osteoporosis     Past Surgical History  Procedure Laterality Date  . Cholecystectomy    . Appendectomy    . Abdominal hysterectomy    . Vertebroplasty        Medication List       This list is accurate as of: 07/01/15 11:59 PM.  Always use your most recent med list.               aspirin EC 81 MG tablet  Take 1 tablet (81 mg total) by mouth daily.     diphenhydrAMINE 25 MG tablet  Commonly known as:  SOMINEX  Take 25 mg by mouth every 6 (six) hours as needed for itching.     DULoxetine 60 MG capsule  Commonly known as:  CYMBALTA  Take 60 mg by mouth daily. For depression     Fluticasone-Salmeterol 250-50 MCG/DOSE Aepb  Commonly known as:  ADVAIR  Inhale 1  puff into the lungs every 12 (twelve) hours. For COPD     gabapentin 100 MG capsule  Commonly known as:  NEURONTIN  Take 1 capsule (100 mg total) by mouth 3 (three) times daily.     gemfibrozil 600 MG tablet  Commonly known as:  LOPID  Take 600 mg by mouth 2 (two) times daily before a meal.     ipratropium-albuterol 0.5-2.5 (3) MG/3ML Soln  Commonly known as:  DUONEB  Take 3 mLs by nebulization every 6 (six) hours as needed (shortness of breath).     lidocaine 5 %  Commonly known as:  LIDODERM  Place 2 patches onto the skin every morning. Apply two patches to lower back. Remove & Discard patch after 12 hours.     lisinopril 5 MG tablet  Commonly known as:  PRINIVIL,ZESTRIL  Take 0.5 tablets (2.5 mg total) by mouth daily.     NAMZARIC 28-10 MG Cp24  Generic drug:  Memantine HCl-Donepezil HCl  Take 28 capsules by mouth daily. For dementia     potassium chloride SA 20 MEQ tablet  Commonly known as:  K-DUR,KLOR-CON  Take 40 mEq by mouth daily. Muscle weakness     pravastatin 20 MG tablet  Commonly known as:  PRAVACHOL  Take 2 tablets (40 mg total) by mouth daily. For hyperlipidemia     ranitidine 150 MG tablet  Commonly known as:  ZANTAC  Take 150 mg by mouth 2 (two) times daily. For reflux     traMADol 50 MG tablet  Commonly known as:  ULTRAM  Take two tablets by mouth every 6 hours as needed for pain     VAGICREAM 5-2 % vaginal cream  Generic drug:  benzocaine-resorcinol  Place 1 application vaginally 2 (two) times daily as needed for itching (and/or burning).     Vitamin D 2000 UNITS Caps  Take 1 capsule by mouth daily. For vitamin D deficiency        No orders of the defined types were placed in this encounter.    Immunization History  Administered Date(s) Administered  . Influenza Whole 05/22/2013  . Influenza-Unspecified 05/23/2014  . Pneumococcal-Unspecified 08/31/2011    Social History  Substance Use Topics  . Smoking status: Former Research scientist (life sciences)  .  Smokeless tobacco: Not on file  . Alcohol Use: No    Review of Systems  DATA OBTAINED: from patient, nurse GENERAL:  no fevers, fatigue, appetite changes SKIN: No itching, rash HEENT: No complaint RESPIRATORY: No cough, wheezing, SOB CARDIAC: No chest pain, palpitations, lower extremity edema  GI: No abdominal pain, No N/V/D or constipation, No heartburn or reflux  GU: No dysuria, frequency or urgency, or incontinence  MUSCULOSKELETAL: No unrelieved bone/joint pain NEUROLOGIC: No headache, dizziness  PSYCHIATRIC: No overt anxiety or sadness  Filed Vitals:   07/01/15 1423  BP: 113/61  Pulse: 82  Temp: 98 F (36.7 C)  Resp: 18    Physical Exam  GENERAL APPEARANCE: Alert, conversant, No acute distress  SKIN: No diaphoresis rash HEENT: Unremarkable RESPIRATORY: Breathing is even, unlabored. Lung sounds are clear   CARDIOVASCULAR: Heart RRR no murmurs, rubs or gallops. No peripheral edema  GASTROINTESTINAL: Abdomen is soft, non-tender, not distended w/ normal bowel sounds.  GENITOURINARY: Bladder non tender, not distended  MUSCULOSKELETAL: No abnormal joints or musculature NEUROLOGIC: Cranial nerves 2-12 grossly intact. Moves all extremities PSYCHIATRIC: Mood and affect appropriate to situation with dementia, no behavioral issues  Patient Active Problem List   Diagnosis Date Noted  . Major depressive disorder, recurrent episode ([place]) 01/14/2015  . Hyperlipidemia associated with type 2 diabetes mellitus ([place]) 06/03/2014  . COPD bronchitis 06/03/2014  . Vascular dementia with paranoia ([place]) 06/03/2014  . Diabetes mellitus type II, controlled, with no complications ([place]) [id number]  . Anxiety state 01/20/2013  . Psychosis 01/20/2013  . Hypokalemia 01/20/2013  . Low back pain 01/20/2013  . G E R D 01/16/2008    CBC    Component Value Date/Time   WBC 13.7* 06/07/2011 0630   RBC 4.50 06/07/2011 0630   RBC 3.27* 01/11/2008 0520   HGB 12.9 06/07/2011 0630   HCT 40.6  06/07/2011 0630   PLT 374 06/07/2011 0630   MCV 90.2 06/07/2011 0630   LYMPHSABS 1.3 06/07/2011 0630   MONOABS 1.4* 06/07/2011 0630   EOSABS 0.4 06/07/2011 0630   BASOSABS 0.1 06/07/2011 0630    CMP     Component Value Date/Time   NA 142 04/25/2014   NA 133* 06/07/2011 0630   K 4.2 04/25/2014   CL 95* 06/07/2011 0630   CO2 26 06/07/2011 0630   GLUCOSE 143* 06/07/2011 0630   BUN 14 04/25/2014   BUN 12 06/07/2011 0630   CREATININE 0.6 04/25/2014   CREATININE 0.41* 06/07/2011 0630   CALCIUM 9.7 06/07/2011 0630   PROT 7.0 06/01/2011 1552   ALBUMIN 3.7 06/01/2011 1552   AST 13 06/01/2011 1552  ALT 10 06/01/2011 1552   ALKPHOS 78 06/01/2011 1552   BILITOT 0.3 06/01/2011 1552   GFRNONAA >90 06/07/2011 0630   GFRAA >90 06/07/2011 0630    Assessment and Plan  Vascular dementia with paranoia without change in her status; will continue namzaric 28-10 mg daily and will monitor   Hyperlipidemia associated with type 2 diabetes mellitus will continue pravachol 40 mg daily will continue lopid 600 mg twice daily ldl is 81; trig 55  Diabetes mellitus type II, controlled, with no complications : is presently not on medications; her hgb a1c is 7.6. will continue lisinopril 2.5 mg daily and asa 81 mg daily Will not make changes; may need to consider starting medication in the future if her a1c continues to climb     Hennie Duos, MD

## 2015-07-05 ENCOUNTER — Encounter: Payer: Self-pay | Admitting: Internal Medicine

## 2015-07-07 NOTE — Assessment & Plan Note (Signed)
will continue pravachol 40 mg daily will continue lopid 600 mg twice daily ldl is 81; trig 55

## 2015-07-07 NOTE — Assessment & Plan Note (Signed)
:   is presently not on medications; her hgb a1c is 7.6. will continue lisinopril 2.5 mg daily and asa 81 mg daily Will not make changes; may need to consider starting medication in the future if her a1c continues to climb

## 2015-07-07 NOTE — Assessment & Plan Note (Signed)
without change in her status; will continue namzaric 28-10 mg daily and will monitor

## 2015-07-31 ENCOUNTER — Non-Acute Institutional Stay (SKILLED_NURSING_FACILITY): Payer: Medicare Other | Admitting: Adult Health

## 2015-07-31 DIAGNOSIS — F22 Delusional disorders: Secondary | ICD-10-CM | POA: Diagnosis not present

## 2015-07-31 DIAGNOSIS — F339 Major depressive disorder, recurrent, unspecified: Secondary | ICD-10-CM | POA: Diagnosis not present

## 2015-07-31 DIAGNOSIS — M545 Low back pain: Secondary | ICD-10-CM | POA: Diagnosis not present

## 2015-07-31 DIAGNOSIS — J449 Chronic obstructive pulmonary disease, unspecified: Secondary | ICD-10-CM

## 2015-07-31 DIAGNOSIS — K219 Gastro-esophageal reflux disease without esophagitis: Secondary | ICD-10-CM | POA: Diagnosis not present

## 2015-07-31 DIAGNOSIS — F0152 Vascular dementia, unspecified severity, with psychotic disturbance: Secondary | ICD-10-CM

## 2015-07-31 DIAGNOSIS — E119 Type 2 diabetes mellitus without complications: Secondary | ICD-10-CM | POA: Diagnosis not present

## 2015-07-31 DIAGNOSIS — E1169 Type 2 diabetes mellitus with other specified complication: Secondary | ICD-10-CM

## 2015-07-31 DIAGNOSIS — E785 Hyperlipidemia, unspecified: Secondary | ICD-10-CM | POA: Diagnosis not present

## 2015-07-31 DIAGNOSIS — F0151 Vascular dementia with behavioral disturbance: Secondary | ICD-10-CM

## 2015-07-31 DIAGNOSIS — IMO0001 Reserved for inherently not codable concepts without codable children: Secondary | ICD-10-CM

## 2015-07-31 DIAGNOSIS — F0150 Vascular dementia without behavioral disturbance: Secondary | ICD-10-CM

## 2015-08-17 ENCOUNTER — Encounter: Payer: Self-pay | Admitting: Adult Health

## 2015-08-17 NOTE — Progress Notes (Signed)
Patient ID: [name] [name], female   DOB: 09-Sep-1940, 75 y.o.   MRN: [id number]   Facility:  [facility]      Allergies  Allergen Reactions  . Avelox [Moxifloxacin Hcl In Nacl] Hives  . Codeine     Reports her throat swelled in the past but has tolerated since.  . Compazine [Prochlorperazine Edisylate] Nausea And Vomiting  . Morphine And Related     Patient doesn't recall  . Penicillins     Patient doesn't recall.  . Phenothiazines     Patient doesn't recall  . Prednisone     Insomnia, confusion.  Marland Kitchen Reglan [Metoclopramide]     Patient doesn't recall "but it was awful"  . Sulfonamide Derivatives Hives    Chief Complaint  Patient presents with  . Medical Management of Chronic Issues    HPI:  She is a long term resident of this facility being seen for the management of her chronic illnesses. Overall her status is stable. She is not voicing any complaints or concerns today; states that she is feeling good. There are no nursing concerns today.     Past Medical History  Diagnosis Date  . Depressive disorder, not elsewhere classified   . Chronic airway obstruction, not elsewhere classified   . Spinal stenosis   . Lumbago   . Esophageal reflux   . Anxiety state, unspecified   . Abnormality of gait   . Candidiasis of other urogenital sites   . Osteoporosis     Past Surgical History  Procedure Laterality Date  . Cholecystectomy    . Appendectomy    . Abdominal hysterectomy    . Vertebroplasty      VITAL SIGNS BP 113/61 mmHg  Pulse 82  Ht 5\' 2"  (1.575 m)  Wt 200 lb (90.719 kg)  BMI 36.57 kg/m2  Patient's Medications  New Prescriptions   No medications on file  Previous Medications   ASPIRIN EC 81 MG TABLET    Take 1 tablet (81 mg total) by mouth daily.   CHOLECALCIFEROL (VITAMIN D) 2000 UNITS CAPS    Take 1 capsule by mouth daily. For vitamin D deficiency   DIPHENHYDRAMINE (SOMINEX) 25 MG TABLET    Take 25 mg by mouth every 6 (six) hours as needed for itching.     DULOXETINE (CYMBALTA) 60 MG CAPSULE    Take 60 mg by mouth daily. For depression   FLUTICASONE-SALMETEROL (ADVAIR) 250-50 MCG/DOSE AEPB    Inhale 1 puff into the lungs every 12 (twelve) hours. For COPD   GABAPENTIN (NEURONTIN) 100 MG CAPSULE    Take 1 capsule (100 mg total) by mouth 3 (three) times daily.   GEMFIBROZIL (LOPID) 600 MG TABLET    Take 600 mg by mouth 2 (two) times daily before a meal.   IPRATROPIUM-ALBUTEROL (DUONEB) 0.5-2.5 (3) MG/3ML SOLN    Take 3 mLs by nebulization every 6 (six) hours as needed (shortness of breath).   LIDOCAINE (LIDODERM) 5 %    Place 2 patches onto the skin every morning. Apply two patches to lower back. Remove & Discard patch after 12 hours.   LISINOPRIL (PRINIVIL,ZESTRIL) 5 MG TABLET    Take 0.5 tablets (2.5 mg total) by mouth daily.   MEMANTINE HCL-DONEPEZIL HCL (NAMZARIC) 28-10 MG CP24    Take 28 capsules by mouth daily. For dementia   POTASSIUM CHLORIDE SA (K-DUR,KLOR-CON) 20 MEQ TABLET    Take 40 mEq by mouth daily. Muscle weakness   PRAVASTATIN (PRAVACHOL) 20 MG TABLET  Take 2 tablets (40 mg total) by mouth daily. For hyperlipidemia   RANITIDINE (ZANTAC) 150 MG TABLET    Take 150 mg by mouth 2 (two) times daily. For reflux   TRAMADOL (ULTRAM) 50 MG TABLET    Take two tablets by mouth every 6 hours as needed for pain  Modified Medications   No medications on file  Discontinued Medications     SIGNIFICANT DIAGNOSTIC EXAMS   2016: declined mammogram and dexa   LABS REVIEWED:   08-24-14:glucose 151; bun 17.9; creat 0.6; k+ 4.2; na++139; liver normal albumin 4.0; hgb a1c 7.0  chol 180; ldl 83; trig 218; hdl 54  10-01-14: wbc 10.2; hgb 12.7; hct 43.9; mcv 93.5; plt 329; glucose 135; bun 13.4; creat 0.67; k+4.4; na++141; liver normal albumin 3.6; chol 181; ldl 103; trig 153; hdl 48  10-04-14: urine for micro-albumin: 1.3 11-08-14: chol 181; ldl 103; trig 153; hdl 48; liver normal albumin 3.6 12-06-13: hgb a1c 7.2  12-20-14: chol 203; ldl 124; trig  89; hdl 62   03-20-15: wbc 8.7; hgb 12.8; hct 42.9; mcv 93; plt 365; glucose 121; bun 28.9; creat 0.64; k+ 4.9; na++143; liver normal albumin 3.7  04-20-15: chol 160; ldl 81; trig 123; hdl 55; hgb a1c 7.6      Review of Systems Constitutional: Negative for appetite change and fatigue.  HENT: Negative for congestion.   Respiratory: Negative for cough, chest tightness and shortness of breath.   Cardiovascular: Negative for chest pain, palpitations and leg swelling.  Gastrointestinal: Negative for nausea, abdominal pain, diarrhea and constipation.  Musculoskeletal: Negative for myalgias and arthralgias.  Skin: Negative for pallor.  Neurological: Negative for dizziness.  Psychiatric/Behavioral: The patient is not nervous/anxious.       Physical Exam Constitutional: She is oriented to person, place, and time. No distress.  Overweight   Eyes: Conjunctivae are normal.  Neck: Neck supple. No JVD present. No thyromegaly present.  Cardiovascular: Normal rate, regular rhythm and intact distal pulses.   Respiratory: Effort normal and breath sounds normal. No respiratory distress. She has no wheezes.  GI: Soft. Bowel sounds are normal. She exhibits no distension. There is no tenderness.  Musculoskeletal: She exhibits no edema.  Able to move all extremities   Lymphadenopathy:    She has no cervical adenopathy.  Neurological: She is alert and oriented to person, place, and time.  Skin: Skin is warm and dry. She is not diaphoretic.  Psychiatric: She has a normal mood and affect.       ASSESSMENT/ PLAN:  1. COPD: she is stable will continue advair 250/50 twice daily; and duoneb every 6 hours as needed will monitor  2. Dyslipidemia: will continue pravachol  40 mg daily will continue lopid 600 mg twice daily ldl is 81; trig 55   3. Hypokalemia: will continue k+ 40 meq daily  4. Vascular dementia: without change in her status; will continue namzaric 28-10 mg daily and will monitor   5.  Gerd: will continue zantac 150 mg twice daily   6. Chronic low back pain: she is presently stable; will continue lidoderm 2 patches to lower back daily; neurontin 100 mg three times daily; cymbalta 60 mg daily and ultram 100 mg every 6 hours as needed and will monitor her status.   7. Anxiety: she is stable and is benefiting from cymbalta 60 mg daily will not make changes will monitor   8. Psychosis: her risperdal has been stopped; is no longer on medications; will continue to monitor  her status.   9. Diabetes: is presently not on medications; her hgb a1c is 7.6.  will continue lisinopril 2.5 mg daily and asa 81 mg daily   Will not make changes; may need to consider starting medication in the future if her a1c continues to climb         Ok Edwards NP [place] [phone] Monday through Friday 8am- 5pm  After hours call [phone]

## 2015-08-29 ENCOUNTER — Non-Acute Institutional Stay (SKILLED_NURSING_FACILITY): Payer: Medicare Other | Admitting: Internal Medicine

## 2015-08-29 DIAGNOSIS — IMO0001 Reserved for inherently not codable concepts without codable children: Secondary | ICD-10-CM

## 2015-08-29 DIAGNOSIS — F339 Major depressive disorder, recurrent, unspecified: Secondary | ICD-10-CM

## 2015-08-29 DIAGNOSIS — E876 Hypokalemia: Secondary | ICD-10-CM

## 2015-08-29 DIAGNOSIS — J449 Chronic obstructive pulmonary disease, unspecified: Secondary | ICD-10-CM | POA: Diagnosis not present

## 2015-09-01 ENCOUNTER — Encounter: Payer: Self-pay | Admitting: Internal Medicine

## 2015-09-01 NOTE — Progress Notes (Signed)
MRN: [id number] Name: [name] [name]  Sex: female Age: 75 y.o. DOB: 1941/08/06  [place] #: Karren Burly Facility/Room: Level Of Care: SNF Provider: Inocencio Homes D Emergency Contacts: Extended Emergency Contact Information Primary Emergency Contact: Blanche East) Address: [address]          [name] Phone: [phone] Relation: Son Secondary Emergency Contact: Charletta Cousin [place] Phone: [phone] Work Phone: [phone] Relation: Son  Code Status:   Allergies: Avelox; Codeine; Compazine; Morphine and related; Penicillins; Phenothiazines; Prednisone; Reglan; and Sulfonamide derivatives  Chief Complaint  Patient presents with  . Medical Management of Chronic Issues    HPI: Patient is 75 y.o. female who is being seen for routine issues of COPD,hypokalemia and depression.  Past Medical History  Diagnosis Date  . Depressive disorder, not elsewhere classified   . Chronic airway obstruction, not elsewhere classified   . Spinal stenosis   . Lumbago   . Esophageal reflux   . Anxiety state, unspecified   . Abnormality of gait   . Candidiasis of other urogenital sites   . Osteoporosis     Past Surgical History  Procedure Laterality Date  . Cholecystectomy    . Appendectomy    . Abdominal hysterectomy    . Vertebroplasty        Medication List       This list is accurate as of: 08/29/15 11:59 PM.  Always use your most recent med list.               aspirin EC 81 MG tablet  Take 1 tablet (81 mg total) by mouth daily.     diphenhydrAMINE 25 MG tablet  Commonly known as:  SOMINEX  Take 25 mg by mouth every 6 (six) hours as needed for itching.     DULoxetine 60 MG capsule  Commonly known as:  CYMBALTA  Take 60 mg by mouth daily. For depression     Fluticasone-Salmeterol 250-50 MCG/DOSE Aepb  Commonly known as:  ADVAIR  Inhale 1 puff into the lungs every 12 (twelve) hours. For COPD      gabapentin 100 MG capsule  Commonly known as:  NEURONTIN  Take 1 capsule (100 mg total) by mouth 3 (three) times daily.     gemfibrozil 600 MG tablet  Commonly known as:  LOPID  Take 600 mg by mouth 2 (two) times daily before a meal.     ipratropium-albuterol 0.5-2.5 (3) MG/3ML Soln  Commonly known as:  DUONEB  Take 3 mLs by nebulization every 6 (six) hours as needed (shortness of breath).     lidocaine 5 %  Commonly known as:  LIDODERM  Place 2 patches onto the skin every morning. Apply two patches to lower back. Remove & Discard patch after 12 hours.     lisinopril 5 MG tablet  Commonly known as:  PRINIVIL,ZESTRIL  Take 0.5 tablets (2.5 mg total) by mouth daily.     NAMZARIC 28-10 MG Cp24  Generic drug:  Memantine HCl-Donepezil HCl  Take 28 capsules by mouth daily. For dementia     potassium chloride SA 20 MEQ tablet  Commonly known as:  K-DUR,KLOR-CON  Take 40 mEq by mouth daily. Muscle weakness     pravastatin 20 MG tablet  Commonly known as:  PRAVACHOL  Take 2 tablets (40 mg total) by mouth daily. For hyperlipidemia     ranitidine 150 MG tablet  Commonly known as:  ZANTAC  Take 150 mg by  mouth 2 (two) times daily. For reflux     traMADol 50 MG tablet  Commonly known as:  ULTRAM  Take two tablets by mouth every 6 hours as needed for pain     Vitamin D 2000 units Caps  Take 1 capsule by mouth daily. For vitamin D deficiency        No orders of the defined types were placed in this encounter.    Immunization History  Administered Date(s) Administered  . Influenza Whole 05/22/2013  . Influenza-Unspecified 05/23/2014  . Pneumococcal-Unspecified 08/31/2011    Social History  Substance Use Topics  . Smoking status: Former Research scientist (life sciences)  . Smokeless tobacco: Not on file  . Alcohol Use: No    Review of Systems  DATA OBTAINED: from patient, nurse GENERAL:  no fevers, fatigue, appetite changes SKIN: No itching, rash HEENT: No complaint RESPIRATORY: No cough,  wheezing, SOB CARDIAC: No chest pain, palpitations, lower extremity edema  GI: No abdominal pain, No N/V/D or constipation, No heartburn or reflux  GU: No dysuria, frequency or urgency, or incontinence  MUSCULOSKELETAL: No unrelieved bone/joint pain NEUROLOGIC: No headache, dizziness  PSYCHIATRIC: No overt anxiety or sadness  Filed Vitals:   09/01/15 1532  BP: 113/61  Pulse: 82  Temp: 98 F (36.7 C)  Resp: 18    Physical Exam  GENERAL APPEARANCE: Alert, conversant, No acute distress  SKIN: No diaphoresis rash, or wounds HEENT: Unremarkable RESPIRATORY: Breathing is even, unlabored. Lung sounds are clear   CARDIOVASCULAR: Heart RRR no murmurs, rubs or gallops. No peripheral edema  GASTROINTESTINAL: Abdomen is soft, non-tender, not distended w/ normal bowel sounds.  GENITOURINARY: Bladder non tender, not distended  MUSCULOSKELETAL: No abnormal joints or musculature NEUROLOGIC: Cranial nerves 2-12 grossly intact. Moves all extremities PSYCHIATRIC: Mood and affect appropriate to situation with dementia, no behavioral issues  Patient Active Problem List   Diagnosis Date Noted  . Major depressive disorder, recurrent episode ([place]) 01/14/2015  . Hyperlipidemia associated with type 2 diabetes mellitus ([place]) 06/03/2014  . COPD bronchitis 06/03/2014  . Vascular dementia with paranoia ([place]) 06/03/2014  . Diabetes mellitus type II, controlled, with no complications ([place]) [id number]  . Anxiety state 01/20/2013  . Psychosis 01/20/2013  . Hypokalemia 01/20/2013  . Low back pain 01/20/2013  . G E R D 01/16/2008    CBC    Component Value Date/Time   WBC 13.7* 06/07/2011 0630   RBC 4.50 06/07/2011 0630   RBC 3.27* 01/11/2008 0520   HGB 12.9 06/07/2011 0630   HCT 40.6 06/07/2011 0630   PLT 374 06/07/2011 0630   MCV 90.2 06/07/2011 0630   LYMPHSABS 1.3 06/07/2011 0630   MONOABS 1.4* 06/07/2011 0630   EOSABS 0.4 06/07/2011 0630   BASOSABS 0.1 06/07/2011 0630    CMP      Component Value Date/Time   NA 142 04/25/2014   NA 133* 06/07/2011 0630   K 4.2 04/25/2014   CL 95* 06/07/2011 0630   CO2 26 06/07/2011 0630   GLUCOSE 143* 06/07/2011 0630   BUN 14 04/25/2014   BUN 12 06/07/2011 0630   CREATININE 0.6 04/25/2014   CREATININE 0.41* 06/07/2011 0630   CALCIUM 9.7 06/07/2011 0630   PROT 7.0 06/01/2011 1552   ALBUMIN 3.7 06/01/2011 1552   AST 13 06/01/2011 1552   ALT 10 06/01/2011 1552   ALKPHOS 78 06/01/2011 1552   BILITOT 0.3 06/01/2011 1552   GFRNONAA >90 06/07/2011 0630   GFRAA >90 06/07/2011 0630    Assessment and Plan  Hypokalemia  Chronic and stable, last K+ was 4.9; cont KCL 40 meq daily  Major depressive disorder, recurrent episode Benefiting from cymbalta 60 mg daily will not make changes will monitor ;Chronic and stable  COPD bronchitis Chronic and stable;  continue advair 250/50 twice daily; and duoneb every 6 hours as needed; will monitor    Hennie Duos, MD

## 2015-09-01 NOTE — Assessment & Plan Note (Signed)
Benefiting from cymbalta 60 mg daily will not make changes will monitor ;Chronic and stable

## 2015-09-01 NOTE — Assessment & Plan Note (Signed)
Chronic and stable, last K+ was 4.9; cont KCL 40 meq daily

## 2015-09-01 NOTE — Assessment & Plan Note (Signed)
Chronic and stable;  continue advair 250/50 twice daily; and duoneb every 6 hours as needed; will monitor

## 2015-10-02 ENCOUNTER — Non-Acute Institutional Stay (SKILLED_NURSING_FACILITY): Payer: Medicare Other | Admitting: Adult Health

## 2015-10-02 ENCOUNTER — Encounter: Payer: Self-pay | Admitting: Adult Health

## 2015-10-02 DIAGNOSIS — K219 Gastro-esophageal reflux disease without esophagitis: Secondary | ICD-10-CM

## 2015-10-02 DIAGNOSIS — F22 Delusional disorders: Secondary | ICD-10-CM | POA: Diagnosis not present

## 2015-10-02 DIAGNOSIS — IMO0001 Reserved for inherently not codable concepts without codable children: Secondary | ICD-10-CM

## 2015-10-02 DIAGNOSIS — E785 Hyperlipidemia, unspecified: Secondary | ICD-10-CM | POA: Diagnosis not present

## 2015-10-02 DIAGNOSIS — E1169 Type 2 diabetes mellitus with other specified complication: Secondary | ICD-10-CM | POA: Diagnosis not present

## 2015-10-02 DIAGNOSIS — J449 Chronic obstructive pulmonary disease, unspecified: Secondary | ICD-10-CM

## 2015-10-02 DIAGNOSIS — F0151 Vascular dementia with behavioral disturbance: Secondary | ICD-10-CM

## 2015-10-02 DIAGNOSIS — E876 Hypokalemia: Secondary | ICD-10-CM | POA: Diagnosis not present

## 2015-10-02 DIAGNOSIS — M545 Low back pain: Secondary | ICD-10-CM | POA: Diagnosis not present

## 2015-10-02 DIAGNOSIS — E119 Type 2 diabetes mellitus without complications: Secondary | ICD-10-CM

## 2015-10-02 DIAGNOSIS — F339 Major depressive disorder, recurrent, unspecified: Secondary | ICD-10-CM

## 2015-10-02 DIAGNOSIS — F0150 Vascular dementia without behavioral disturbance: Secondary | ICD-10-CM

## 2015-10-02 DIAGNOSIS — F0152 Vascular dementia, unspecified severity, with psychotic disturbance: Secondary | ICD-10-CM

## 2015-10-02 NOTE — Progress Notes (Signed)
Patient ID: [name] [name], female   DOB: 1940/12/27, 75 y.o.   MRN: [id number]   Facility: [facility]       Allergies  Allergen Reactions  . Avelox [Moxifloxacin Hcl In Nacl] Hives  . Codeine     Reports her throat swelled in the past but has tolerated since.  . Compazine [Prochlorperazine Edisylate] Nausea And Vomiting  . Morphine And Related     Patient doesn't recall  . Penicillins     Patient doesn't recall.  . Phenothiazines     Patient doesn't recall  . Prednisone     Insomnia, confusion.  Marland Kitchen Reglan [Metoclopramide]     Patient doesn't recall "but it was awful"  . Sulfonamide Derivatives Hives    Chief Complaint  Patient presents with  . Medical Management of Chronic Issues    Follow up    HPI:  She is a long term resident of this facility being seen for the management of her chronic illnesses. Overall she is without significant change in her status. She tells me that she is feeling good and has no complaints. There are no nursing concerns at this time.    Past Medical History  Diagnosis Date  . Depressive disorder, not elsewhere classified   . Chronic airway obstruction, not elsewhere classified   . Spinal stenosis   . Lumbago   . Esophageal reflux   . Anxiety state, unspecified   . Abnormality of gait   . Candidiasis of other urogenital sites   . Osteoporosis     Past Surgical History  Procedure Laterality Date  . Cholecystectomy    . Appendectomy    . Abdominal hysterectomy    . Vertebroplasty      VITAL SIGNS BP 142/86 mmHg  Pulse 82  Temp(Src) 98 F (36.7 C) (Oral)  Resp 18  Ht 5\' 3"  (1.6 m)  Wt 202 lb (91.627 kg)  BMI 35.79 kg/m2  SpO2 98%  Patient's Medications  New Prescriptions   No medications on file  Previous Medications   ACETAMINOPHEN (TYLENOL) 325 MG TABLET    Take 650 mg by mouth every 4 (four) hours as needed.   ASPIRIN EC 81 MG TABLET    Take 1 tablet (81 mg total) by mouth daily.   CHOLECALCIFEROL (VITAMIN D) 2000  UNITS CAPS    Take 1 capsule by mouth daily. For vitamin D deficiency   DIPHENHYDRAMINE (SOMINEX) 25 MG TABLET    Take 25 mg by mouth every 6 (six) hours as needed for itching.    DULOXETINE (CYMBALTA) 60 MG CAPSULE    Take 60 mg by mouth daily. For depression   FLUTICASONE-SALMETEROL (ADVAIR) 250-50 MCG/DOSE AEPB    Inhale 1 puff into the lungs every 12 (twelve) hours. For COPD   GABAPENTIN (NEURONTIN) 100 MG CAPSULE    Take 1 capsule (100 mg total) by mouth 3 (three) times daily.   GEMFIBROZIL (LOPID) 600 MG TABLET    Take 600 mg by mouth 2 (two) times daily before a meal.   IPRATROPIUM-ALBUTEROL (DUONEB) 0.5-2.5 (3) MG/3ML SOLN    Take 3 mLs by nebulization every 6 (six) hours as needed (shortness of breath).   LIDOCAINE (LIDODERM) 5 %    Place 2 patches onto the skin every morning. Apply two patches to lower back. Remove & Discard patch after 12 hours.   LISINOPRIL (PRINIVIL,ZESTRIL) 5 MG TABLET    Take 0.5 tablets (2.5 mg total) by mouth daily.   MEMANTINE HCL-DONEPEZIL HCL (NAMZARIC) 28-10 MG  CP24    Take 1 capsule by mouth daily. For dementia   POTASSIUM CHLORIDE SA (K-DUR,KLOR-CON) 20 MEQ TABLET    Take 40 mEq by mouth daily. Muscle weakness   PRAVASTATIN (PRAVACHOL) 40 MG TABLET    Take 40 mg by mouth daily.   RANITIDINE (ZANTAC) 150 MG TABLET    Take 150 mg by mouth 2 (two) times daily. For reflux   TRAMADOL (ULTRAM) 50 MG TABLET    Take two tablets by mouth every 6 hours as needed for pain  Modified Medications   No medications on file  Discontinued Medications     SIGNIFICANT DIAGNOSTIC EXAMS   10-01-14: wbc 10.2; hgb 12.7; hct 43.9; mcv 93.5; plt 329; glucose 135; bun 13.4; creat 0.67; k+4.4; na++141; liver normal albumin 3.6; chol 181; ldl 103; trig 153; hdl 48  10-04-14: urine for micro-albumin: 1.3 11-08-14: chol 181; ldl 103; trig 153; hdl 48; liver normal albumin 3.6 12-06-13: hgb a1c 7.2  12-20-14: chol 203; ldl 124; trig 89; hdl 62   03-20-15: wbc 8.7; hgb 12.8; hct 42.9;  mcv 93; plt 365; glucose 121; bun 28.9; creat 0.64; k+ 4.9; na++143; liver normal albumin 3.7  04-20-15: chol 160; ldl 81; trig 123; hdl 55; hgb a1c 7.6      Review of Systems Constitutional: Negative for appetite change and fatigue.  HENT: Negative for congestion.   Respiratory: Negative for cough, chest tightness and shortness of breath.   Cardiovascular: Negative for chest pain, palpitations and leg swelling.  Gastrointestinal: Negative for nausea, abdominal pain, diarrhea and constipation.  Musculoskeletal: Negative for myalgias and arthralgias.  Skin: Negative for pallor.  Neurological: Negative for dizziness.  Psychiatric/Behavioral: The patient is not nervous/anxious.       Physical Exam Constitutional: She is oriented to person, place, and time. No distress.  Overweight   Eyes: Conjunctivae are normal.  Neck: Neck supple. No JVD present. No thyromegaly present.  Cardiovascular: Normal rate, regular rhythm and intact distal pulses.   Respiratory: Effort normal and breath sounds normal. No respiratory distress. She has no wheezes.  GI: Soft. Bowel sounds are normal. She exhibits no distension. There is no tenderness.  Musculoskeletal: She exhibits no edema.  Able to move all extremities   Lymphadenopathy:    She has no cervical adenopathy.  Neurological: She is alert and oriented to person, place, and time.  Skin: Skin is warm and dry. She is not diaphoretic.  Psychiatric: She has a normal mood and affect.       ASSESSMENT/ PLAN:  1. COPD: she is stable will continue advair 250/50 twice daily; and duoneb every 6 hours as needed will monitor  2. Dyslipidemia: will continue pravachol  40 mg daily will continue lopid 600 mg twice daily ldl is 81; trig 55   3. Hypokalemia: will continue k+ 40 meq daily  4. Vascular dementia: without change in her status; will continue namzaric 28-10 mg daily and will monitor   5. Gerd: will continue zantac 150 mg twice daily   6.  Chronic low back pain: she is presently stable; will continue lidoderm 2 patches to lower back daily; neurontin 100 mg three times daily; cymbalta 60 mg daily and ultram 100 mg every 6 hours as needed and will monitor her status.   7. Anxiety: she is stable and is benefiting from cymbalta 60 mg daily will not make changes will monitor   8. Psychosis: her risperdal has been stopped; is no longer on medications; will continue to monitor  her status.   9. Diabetes: is presently not on medications; her hgb a1c is 7.6.  will continue lisinopril 2.5 mg daily and asa 81 mg daily   Will not make changes; may need to consider starting medication in the future if her a1c continues to climb             Ok Edwards NP [place] [phone] Monday through Friday 8am- 5pm  After hours call [phone]

## 2015-10-31 DIAGNOSIS — F0390 Unspecified dementia without behavioral disturbance: Secondary | ICD-10-CM | POA: Diagnosis not present

## 2015-10-31 DIAGNOSIS — F329 Major depressive disorder, single episode, unspecified: Secondary | ICD-10-CM | POA: Diagnosis not present

## 2015-11-06 ENCOUNTER — Non-Acute Institutional Stay (SKILLED_NURSING_FACILITY): Payer: Medicare Other | Admitting: Adult Health

## 2015-11-06 ENCOUNTER — Encounter: Payer: Self-pay | Admitting: Adult Health

## 2015-11-06 DIAGNOSIS — E1169 Type 2 diabetes mellitus with other specified complication: Secondary | ICD-10-CM

## 2015-11-06 DIAGNOSIS — F339 Major depressive disorder, recurrent, unspecified: Secondary | ICD-10-CM

## 2015-11-06 DIAGNOSIS — IMO0001 Reserved for inherently not codable concepts without codable children: Secondary | ICD-10-CM

## 2015-11-06 DIAGNOSIS — F0151 Vascular dementia with behavioral disturbance: Secondary | ICD-10-CM

## 2015-11-06 DIAGNOSIS — E119 Type 2 diabetes mellitus without complications: Secondary | ICD-10-CM | POA: Diagnosis not present

## 2015-11-06 DIAGNOSIS — E785 Hyperlipidemia, unspecified: Secondary | ICD-10-CM | POA: Diagnosis not present

## 2015-11-06 DIAGNOSIS — M545 Low back pain: Secondary | ICD-10-CM | POA: Diagnosis not present

## 2015-11-06 DIAGNOSIS — F0152 Vascular dementia, unspecified severity, with psychotic disturbance: Secondary | ICD-10-CM

## 2015-11-06 DIAGNOSIS — J449 Chronic obstructive pulmonary disease, unspecified: Secondary | ICD-10-CM

## 2015-11-06 DIAGNOSIS — F0150 Vascular dementia without behavioral disturbance: Secondary | ICD-10-CM

## 2015-11-06 DIAGNOSIS — F22 Delusional disorders: Secondary | ICD-10-CM | POA: Diagnosis not present

## 2015-11-06 NOTE — Progress Notes (Signed)
Patient ID: [name] [name], female   DOB: [birth year], 75 y.o.   MRN: [id number]   Facility:  [facility]       Allergies  Allergen Reactions  . Avelox [Moxifloxacin Hcl In Nacl] Hives  . Codeine     Reports her throat swelled in the past but has tolerated since.  . Compazine [Prochlorperazine Edisylate] Nausea And Vomiting  . Morphine And Related     Patient doesn't recall  . Penicillins     Patient doesn't recall.  . Phenothiazines     Patient doesn't recall  . Prednisone     Insomnia, confusion.  Marland Kitchen Reglan [Metoclopramide]     Patient doesn't recall "but it was awful"  . Sulfonamide Derivatives Hives    Chief Complaint  Patient presents with  . Medical Management of Chronic Issues    Follow up    HPI:  She is a long term resident of this facility being seen for the management of her chronic illnesses. Overall her status is stable. She tells me that she is feeling good and does not have any concerns. There are no nursing concerns at this time.    Past Medical History  Diagnosis Date  . Depressive disorder, not elsewhere classified   . Chronic airway obstruction, not elsewhere classified   . Spinal stenosis   . Lumbago   . Esophageal reflux   . Anxiety state, unspecified   . Abnormality of gait   . Candidiasis of other urogenital sites   . Osteoporosis     Past Surgical History  Procedure Laterality Date  . Cholecystectomy    . Appendectomy    . Abdominal hysterectomy    . Vertebroplasty      VITAL SIGNS BP 112/53 mmHg  Pulse 82  Temp(Src) 98 F (36.7 C) (Oral)  Resp 18  Ht 5\' 2"  (1.575 m)  Wt 200 lb (90.719 kg)  BMI 36.57 kg/m2  SpO2 98%  Patient's Medications  New Prescriptions   No medications on file  Previous Medications   ACETAMINOPHEN (TYLENOL) 325 MG TABLET    Take 650 mg by mouth every 4 (four) hours as needed.   ASPIRIN EC 81 MG TABLET    Take 1 tablet (81 mg total) by mouth daily.   CHOLECALCIFEROL (VITAMIN D) 2000 UNITS CAPS    Take  1 capsule by mouth daily. For vitamin D deficiency   DIPHENHYDRAMINE (SOMINEX) 25 MG TABLET    Take 25 mg by mouth every 6 (six) hours as needed for itching.    DULOXETINE (CYMBALTA) 60 MG CAPSULE    Take 60 mg by mouth daily. For depression   FLUTICASONE-SALMETEROL (ADVAIR) 250-50 MCG/DOSE AEPB    Inhale 1 puff into the lungs every 12 (twelve) hours. For COPD   GABAPENTIN (NEURONTIN) 100 MG CAPSULE    Take 1 capsule (100 mg total) by mouth 3 (three) times daily.   GEMFIBROZIL (LOPID) 600 MG TABLET    Take 600 mg by mouth 2 (two) times daily before a meal.   IPRATROPIUM-ALBUTEROL (DUONEB) 0.5-2.5 (3) MG/3ML SOLN    Take 3 mLs by nebulization every 6 (six) hours as needed (shortness of breath).   LIDOCAINE (LIDODERM) 5 %    Place 2 patches onto the skin every morning. Apply two patches to lower back. Remove & Discard patch after 12 hours.   LISINOPRIL (PRINIVIL,ZESTRIL) 5 MG TABLET    Take 0.5 tablets (2.5 mg total) by mouth daily.   MEMANTINE HCL-DONEPEZIL HCL (NAMZARIC) 28-10 MG CP24  Take 1 capsule by mouth daily. For dementia   POTASSIUM CHLORIDE SA (K-DUR,KLOR-CON) 20 MEQ TABLET    Take 40 mEq by mouth daily. Muscle weakness   PRAVASTATIN (PRAVACHOL) 40 MG TABLET    Take 40 mg by mouth daily.   RANITIDINE (ZANTAC) 150 MG TABLET    Take 150 mg by mouth 2 (two) times daily. For reflux   TRAMADOL (ULTRAM) 50 MG TABLET    Take two tablets by mouth every 6 hours as needed for pain  Modified Medications   No medications on file  Discontinued Medications   No medications on file     SIGNIFICANT DIAGNOSTIC EXAMS  10-04-14: urine for micro-albumin: 1.3 11-08-14: chol 181; ldl 103; trig 153; hdl 48; liver normal albumin 3.6 12-06-13: hgb a1c 7.2  12-20-14: chol 203; ldl 124; trig 89; hdl 62   03-20-15: wbc 8.7; hgb 12.8; hct 42.9; mcv 93; plt 365; glucose 121; bun 28.9; creat 0.64; k+ 4.9; na++143; liver normal albumin 3.7  04-20-15: chol 160; ldl 81; trig 123; hdl 55; hgb a1c 7.6      Review  of Systems Constitutional: Negative for appetite change and fatigue.  HENT: Negative for congestion.   Respiratory: Negative for cough, chest tightness and shortness of breath.   Cardiovascular: Negative for chest pain, palpitations and leg swelling.  Gastrointestinal: Negative for nausea, abdominal pain, diarrhea and constipation.  Musculoskeletal: Negative for myalgias and arthralgias.  Skin: Negative for pallor.  Neurological: Negative for dizziness.  Psychiatric/Behavioral: The patient is not nervous/anxious.       Physical Exam Constitutional: She is oriented to person, place, and time. No distress.  Overweight   Eyes: Conjunctivae are normal.  Neck: Neck supple. No JVD present. No thyromegaly present.  Cardiovascular: Normal rate, regular rhythm and intact distal pulses.   Respiratory: Effort normal and breath sounds normal. No respiratory distress. She has no wheezes.  GI: Soft. Bowel sounds are normal. She exhibits no distension. There is no tenderness.  Musculoskeletal: She exhibits no edema.  Able to move all extremities   Lymphadenopathy:    She has no cervical adenopathy.  Neurological: She is alert and oriented to person, place, and time.  Skin: Skin is warm and dry. She is not diaphoretic.  Psychiatric: She has a normal mood and affect.       ASSESSMENT/ PLAN:  1. COPD: she is stable will continue advair 250/50 twice daily; and duoneb every 6 hours as needed will monitor  2. Dyslipidemia: will continue pravachol  40 mg daily will continue lopid 600 mg twice daily ldl is 81; trig 55   3. Hypokalemia: will continue k+ 40 meq daily  4. Vascular dementia: without change in her status; will continue namzaric 28-10 mg daily and will monitor   5. Gerd: will continue zantac 150 mg twice daily   6. Chronic low back pain: she is presently stable; will continue lidoderm 2 patches to lower back daily; neurontin 100 mg three times daily; cymbalta 60 mg daily and ultram  100 mg every 6 hours as needed and will monitor her status.   7. Anxiety: she is stable and is benefiting from cymbalta 60 mg daily will not make changes will monitor   8. Psychosis: she has been off the risperdal for a prolonged period of time and  is no longer on medications; will continue to monitor her status.   9. Diabetes: is presently not on medications; her hgb a1c is 7.6.  will continue lisinopril 2.5 mg  daily and asa 81 mg daily   Will not make changes; may need to consider starting medication in the future if her a1c continues to climb     Will check cbc; cmp; lipids; hgb a1c; and urine for micro-albumin      Ok Edwards NP [facility]  Contact [phone] Monday through Friday 8am- 5pm  After hours call [phone]

## 2015-11-08 DIAGNOSIS — E11628 Type 2 diabetes mellitus with other skin complications: Secondary | ICD-10-CM | POA: Diagnosis not present

## 2015-11-08 DIAGNOSIS — D649 Anemia, unspecified: Secondary | ICD-10-CM | POA: Diagnosis not present

## 2015-11-08 DIAGNOSIS — N390 Urinary tract infection, site not specified: Secondary | ICD-10-CM | POA: Diagnosis not present

## 2015-11-08 LAB — CBC AND DIFFERENTIAL
HCT: 41 % (ref 36–46)
Hemoglobin: 12.2 g/dL (ref 12.0–16.0)
Platelets: 377 10*3/uL (ref 150–399)
WBC: 10.6 10^3/mL

## 2015-11-08 LAB — HEPATIC FUNCTION PANEL
ALT: 12 U/L (ref 7–35)
AST: 14 U/L (ref 13–35)
Alkaline Phosphatase: 90 U/L (ref 25–125)
BILIRUBIN, TOTAL: 0.2 mg/dL

## 2015-11-08 LAB — BASIC METABOLIC PANEL
BUN: 15 mg/dL (ref 4–21)
Creatinine: 0.6 mg/dL (ref 0.5–1.1)
GLUCOSE: 205 mg/dL
POTASSIUM: 4 mmol/L (ref 3.4–5.3)
SODIUM: 135 mmol/L — AB (ref 137–147)

## 2015-11-08 LAB — LIPID PANEL
CHOLESTEROL: 154 mg/dL (ref 0–200)
HDL: 57 mg/dL (ref 35–70)
LDL Cholesterol: 76 mg/dL
LDL/HDL RATIO: 2.7
TRIGLYCERIDES: 107 mg/dL (ref 40–160)

## 2015-11-08 LAB — HEMOGLOBIN A1C: HEMOGLOBIN A1C: 7.7

## 2015-11-26 DIAGNOSIS — Z961 Presence of intraocular lens: Secondary | ICD-10-CM | POA: Diagnosis not present

## 2015-11-26 DIAGNOSIS — H40013 Open angle with borderline findings, low risk, bilateral: Secondary | ICD-10-CM | POA: Diagnosis not present

## 2015-11-26 DIAGNOSIS — Z7951 Long term (current) use of inhaled steroids: Secondary | ICD-10-CM | POA: Diagnosis not present

## 2015-11-26 LAB — HM DIABETES EYE EXAM

## 2015-12-03 ENCOUNTER — Non-Acute Institutional Stay (SKILLED_NURSING_FACILITY): Payer: Medicare Other | Admitting: Adult Health

## 2015-12-03 ENCOUNTER — Encounter: Payer: Self-pay | Admitting: Adult Health

## 2015-12-03 DIAGNOSIS — J449 Chronic obstructive pulmonary disease, unspecified: Secondary | ICD-10-CM

## 2015-12-03 DIAGNOSIS — E876 Hypokalemia: Secondary | ICD-10-CM

## 2015-12-03 DIAGNOSIS — E119 Type 2 diabetes mellitus without complications: Secondary | ICD-10-CM

## 2015-12-03 DIAGNOSIS — F22 Delusional disorders: Secondary | ICD-10-CM | POA: Diagnosis not present

## 2015-12-03 DIAGNOSIS — K219 Gastro-esophageal reflux disease without esophagitis: Secondary | ICD-10-CM

## 2015-12-03 DIAGNOSIS — M545 Low back pain: Secondary | ICD-10-CM

## 2015-12-03 DIAGNOSIS — E785 Hyperlipidemia, unspecified: Secondary | ICD-10-CM

## 2015-12-03 DIAGNOSIS — E1169 Type 2 diabetes mellitus with other specified complication: Secondary | ICD-10-CM | POA: Diagnosis not present

## 2015-12-03 DIAGNOSIS — IMO0001 Reserved for inherently not codable concepts without codable children: Secondary | ICD-10-CM

## 2015-12-03 DIAGNOSIS — F0150 Vascular dementia without behavioral disturbance: Secondary | ICD-10-CM

## 2015-12-03 DIAGNOSIS — F0152 Vascular dementia, unspecified severity, with psychotic disturbance: Secondary | ICD-10-CM

## 2015-12-03 DIAGNOSIS — F0151 Vascular dementia with behavioral disturbance: Secondary | ICD-10-CM

## 2015-12-03 DIAGNOSIS — F339 Major depressive disorder, recurrent, unspecified: Secondary | ICD-10-CM | POA: Diagnosis not present

## 2015-12-03 NOTE — Progress Notes (Signed)
Patient ID: [name] [name], female   DOB: 1941-05-16, 75 y.o.   MRN: [id number]    Facility:  [facility]     CODE STATUS: DNR  Allergies  Allergen Reactions  . Avelox [Moxifloxacin Hcl In Nacl] Hives  . Codeine     Reports her throat swelled in the past but has tolerated since.  . Compazine [Prochlorperazine Edisylate] Nausea And Vomiting  . Morphine And Related     Patient doesn't recall  . Mycobacterium   . Penicillins     Patient doesn't recall.  . Phenothiazines     Patient doesn't recall  . Prednisone     Insomnia, confusion.  Marland Kitchen Reglan [Metoclopramide]     Patient doesn't recall "but it was awful"  . Sulfonamide Derivatives Hives    Chief Complaint  Patient presents with  . Annual Exam    HPI:  She is a long term resident of this facility being seen for her annual exam. She has remained stable over the past year. She has not been hospitalized over the past year. She will not leave the facility for testing such as dexa or mammogram. She tells me that she is feeling good. There are no nursing concerns at this time.    Past Medical History  Diagnosis Date  . Depressive disorder, not elsewhere classified   . Chronic airway obstruction, not elsewhere classified   . Spinal stenosis   . Lumbago   . Esophageal reflux   . Anxiety state, unspecified   . Abnormality of gait   . Candidiasis of other urogenital sites   . Osteoporosis     Past Surgical History  Procedure Laterality Date  . Cholecystectomy    . Appendectomy    . Abdominal hysterectomy    . Vertebroplasty     History reviewed. No pertinent family history.  Social History   Social History  . Marital Status: Divorced    Spouse Name: N/A  . Number of Children: N/A  . Years of Education: N/A   Occupational History  . Not on file.   Social History Main Topics  . Smoking status: Former Research scientist (life sciences)  . Smokeless tobacco: Not on file  . Alcohol Use: No  . Drug Use: No  . Sexual Activity: No   Other  Topics Concern  . Not on file   Social History Narrative    Immunization History  Administered Date(s) Administered  . Influenza Whole 05/22/2013  . Influenza-Unspecified 05/23/2014, 05/20/2015  . Pneumococcal-Unspecified 08/31/2011      VITAL SIGNS BP 114/68 mmHg  Pulse 93  Temp(Src) 98 F (36.7 C) (Oral)  Resp 20  Ht 5\' 2"  (1.575 m)  Wt 198 lb 2 oz (89.869 kg)  BMI 36.23 kg/m2  SpO2 98%  Patient's Medications  New Prescriptions   No medications on file  Previous Medications   ACETAMINOPHEN (TYLENOL) 325 MG TABLET    Take 650 mg by mouth every 4 (four) hours as needed.   ASPIRIN EC 81 MG TABLET    Take 1 tablet (81 mg total) by mouth daily.   CHOLECALCIFEROL 10000 UNITS TABS    Take 2 tablets by mouth daily.   DIPHENHYDRAMINE (SOMINEX) 25 MG TABLET    Take 25 mg by mouth every 6 (six) hours as needed for itching.    DULOXETINE (CYMBALTA) 60 MG CAPSULE    Take 60 mg by mouth daily. For depression   FLUTICASONE-SALMETEROL (ADVAIR) 250-50 MCG/DOSE AEPB    Inhale 1 puff into the lungs every  12 (twelve) hours. For COPD   GABAPENTIN (NEURONTIN) 100 MG CAPSULE    Take 1 capsule (100 mg total) by mouth 3 (three) times daily.   GEMFIBROZIL (LOPID) 600 MG TABLET    Take 600 mg by mouth 2 (two) times daily before a meal.   IPRATROPIUM-ALBUTEROL (DUONEB) 0.5-2.5 (3) MG/3ML SOLN    Take 3 mLs by nebulization every 6 (six) hours as needed (shortness of breath).   LIDOCAINE (LIDODERM) 5 %    Place 2 patches onto the skin every morning. Apply two patches to lower back. Remove & Discard patch after 12 hours.   LISINOPRIL (PRINIVIL,ZESTRIL) 5 MG TABLET    Take 0.5 tablets (2.5 mg total) by mouth daily.   MEMANTINE HCL-DONEPEZIL HCL (NAMZARIC) 28-10 MG CP24    Take 1 capsule by mouth daily. For dementia   POTASSIUM CHLORIDE SA (K-DUR,KLOR-CON) 20 MEQ TABLET    Take 40 mEq by mouth daily. Muscle weakness   PRAVASTATIN (PRAVACHOL) 40 MG TABLET    Take 40 mg by mouth daily.   RANITIDINE  (ZANTAC) 150 MG TABLET    Take 150 mg by mouth 2 (two) times daily. For reflux   TRAMADOL (ULTRAM) 50 MG TABLET    Take two tablets by mouth every 6 hours as needed for pain  Modified Medications   No medications on file  Discontinued Medications     SIGNIFICANT DIAGNOSTIC EXAMS  12-06-13: hgb a1c 7.2  12-20-14: chol 203; ldl 124; trig 89; hdl 62   03-20-15: wbc 8.7; hgb 12.8; hct 42.9; mcv 93; plt 365; glucose 121; bun 28.9; creat 0.64; k+ 4.9; na++143; liver normal albumin 3.7  04-20-15: chol 160; ldl 81; trig 123; hdl 55; hgb a1c 7.6  11-08-15:wbc 10.6; hgb 12.2; hct 40.7; mcv 89.;4 plt 377; glucose 205; un 15.3; creat 0.56; k+ 4.0; na++135; liver normal albumin 4.0; hgb a1c 7.7  chol 154; ldl 76; trig 107; hdl 57     Review of Systems Constitutional: Negative for appetite change and fatigue.  HENT: Negative for congestion.   Respiratory: Negative for cough, chest tightness and shortness of breath.   Cardiovascular: Negative for chest pain, palpitations and leg swelling.  Gastrointestinal: Negative for nausea, abdominal pain, diarrhea and constipation.  Musculoskeletal: Negative for myalgias and arthralgias.  Skin: Negative for pallor.  Neurological: Negative for dizziness.  Psychiatric/Behavioral: The patient is not nervous/anxious.       Physical Exam Constitutional: She is oriented to person, place, and time. No distress.  Overweight   Eyes: Conjunctivae are normal.  Neck: Neck supple. No JVD present. No thyromegaly present.  Cardiovascular: Normal rate, regular rhythm and intact distal pulses.   Respiratory: Effort normal and breath sounds normal. No respiratory distress. She has no wheezes.  GI: Soft. Bowel sounds are normal. She exhibits no distension. There is no tenderness.  Musculoskeletal: She exhibits no edema.  Able to move all extremities   Lymphadenopathy:    She has no cervical adenopathy.  Neurological: She is alert and oriented to person, place, and time.    Skin: Skin is warm and dry. She is not diaphoretic.  Psychiatric: She has a normal mood and affect.       ASSESSMENT/ PLAN:  1. COPD: she is stable will continue advair 250/50 twice daily; and duoneb every 6 hours as needed will monitor  2. Dyslipidemia: will continue pravachol  40 mg daily will continue lopid 600 mg twice daily ldl is 76; trig 107   3. Hypokalemia: will continue  k+ 40 meq daily  4. Vascular dementia: without change in her status; will continue namzaric 28-10 mg daily and will monitor   5. Gerd: will continue zantac 150 mg twice daily   6. Chronic low back pain: she is presently stable; will continue lidoderm 2 patches to lower back daily; neurontin 100 mg three times daily; cymbalta 60 mg daily and ultram 100 mg every 6 hours as needed and will monitor her status.   7. Anxiety: she is stable and is benefiting from cymbalta 60 mg daily will not make changes will monitor   8. Psychosis: she has been off the risperdal for a prolonged period of time and  is no longer on medications; will continue to monitor her status.   9. Diabetes: is presently not on medications; her hgb a1c is 7.7.  will continue lisinopril 2.5 mg daily and asa 81 mg daily   Will not make changes; may need to consider starting medication in the future if her a1c continues to climb      Her health maintenance is up to date.   Time spent with patient 45   minutes >50% time spent counseling; reviewing medical record; tests; labs; and developing future plan of care     Ok Edwards NP [facility]  Contact [phone] Monday through Friday 8am- 5pm  After hours call [phone]

## 2015-12-09 LAB — HM DIABETES FOOT EXAM

## 2015-12-30 ENCOUNTER — Encounter: Payer: Self-pay | Admitting: Internal Medicine

## 2015-12-30 ENCOUNTER — Non-Acute Institutional Stay (SKILLED_NURSING_FACILITY): Payer: Medicare Other | Admitting: Internal Medicine

## 2015-12-30 DIAGNOSIS — J449 Chronic obstructive pulmonary disease, unspecified: Secondary | ICD-10-CM | POA: Diagnosis not present

## 2015-12-30 DIAGNOSIS — E1169 Type 2 diabetes mellitus with other specified complication: Secondary | ICD-10-CM | POA: Diagnosis not present

## 2015-12-30 DIAGNOSIS — M545 Low back pain: Secondary | ICD-10-CM

## 2015-12-30 DIAGNOSIS — E785 Hyperlipidemia, unspecified: Secondary | ICD-10-CM | POA: Diagnosis not present

## 2015-12-30 DIAGNOSIS — IMO0001 Reserved for inherently not codable concepts without codable children: Secondary | ICD-10-CM

## 2015-12-30 NOTE — Progress Notes (Signed)
MRN: [id number] Name: [name] [name]  Sex: female Age: 75 y.o. DOB: 10-Dec-1940  [place] #: [facility] Facility/Room: 232 B Level Of Care: SNF Provider: Noah Delaine. Sheppard Coil, MD Emergency Contacts: Extended Emergency Contact Information Primary Emergency Contact: [name]) Address: [place], [place] Phone: [phone] Relation: Son Secondary Emergency Contact: Charletta Cousin [place] Phone: [phone] Work Phone: [phone] Relation: Son  Code Status: DNR  Allergies: Avelox; Codeine; Compazine; Morphine and related; Mycobacterium; Penicillins; Phenothiazines; Prednisone; Reglan; and Sulfonamide derivatives  Chief Complaint  Patient presents with  . Medical Management of Chronic Issues    Routine Visit    HPI: Patient is 75 y.o. female who is being seen for routine issues of COPD, HLD, LBP.  Past Medical History  Diagnosis Date  . Depressive disorder, not elsewhere classified   . Chronic airway obstruction, not elsewhere classified   . Spinal stenosis   . Lumbago   . Esophageal reflux   . Anxiety state, unspecified   . Abnormality of gait   . Candidiasis of other urogenital sites   . Osteoporosis     Past Surgical History  Procedure Laterality Date  . Cholecystectomy    . Appendectomy    . Abdominal hysterectomy    . Vertebroplasty        Medication List       This list is accurate as of: 12/30/15 11:59 PM.  Always use your most recent med list.               acetaminophen 325 MG tablet  Commonly known as:  TYLENOL  Take 650 mg by mouth every 4 (four) hours as needed.     aspirin EC 81 MG tablet  Take 1 tablet (81 mg total) by mouth daily.     Cholecalciferol 10000 units Tabs  Take 2 tablets by mouth daily.     diphenhydrAMINE 25 MG tablet  Commonly known as:  SOMINEX  Take 25 mg by mouth every 6 (six) hours as needed for itching.     DULoxetine 60 MG capsule   Commonly known as:  CYMBALTA  Take 60 mg by mouth daily. For depression     Fluticasone-Salmeterol 250-50 MCG/DOSE Aepb  Commonly known as:  ADVAIR  Inhale 1 puff into the lungs every 12 (twelve) hours. For COPD     gabapentin 100 MG capsule  Commonly known as:  NEURONTIN  Take 1 capsule (100 mg total) by mouth 3 (three) times daily.     gemfibrozil 600 MG tablet  Commonly known as:  LOPID  Take 600 mg by mouth 2 (two) times daily before a meal.     ipratropium-albuterol 0.5-2.5 (3) MG/3ML Soln  Commonly known as:  DUONEB  Take 3 mLs by nebulization every 6 (six) hours as needed (shortness of breath).     lidocaine 5 %  Commonly known as:  LIDODERM  Place 2 patches onto the skin every morning. Apply two patches to lower back. Remove & Discard patch after 12 hours.     lisinopril 5 MG tablet  Commonly known as:  PRINIVIL,ZESTRIL  Take 0.5 tablets (2.5 mg total) by mouth daily.     NAMZARIC 28-10 MG Cp24  Generic drug:  Memantine HCl-Donepezil HCl  Take 1 capsule by mouth daily. For dementia     potassium chloride SA 20 MEQ tablet  Commonly known as:  K-DUR,KLOR-CON  Take 40 mEq  by mouth daily. Muscle weakness     pravastatin 40 MG tablet  Commonly known as:  PRAVACHOL  Take 40 mg by mouth daily.     ranitidine 150 MG tablet  Commonly known as:  ZANTAC  Take 150 mg by mouth 2 (two) times daily. For reflux     traMADol 50 MG tablet  Commonly known as:  ULTRAM  Take two tablets by mouth every 6 hours as needed for pain     traZODone 25 mg Tabs tablet  Commonly known as:  DESYREL  Take 25 mg by mouth at bedtime.        Meds ordered this encounter  Medications  . traZODone (DESYREL) 25 mg TABS tablet    Sig: Take 25 mg by mouth at bedtime.    Immunization History  Administered Date(s) Administered  . Influenza Whole 05/22/2013  . Influenza-Unspecified 05/23/2014, 05/20/2015  . Pneumococcal-Unspecified 08/31/2011    Social History  Substance Use Topics   . Smoking status: Former Research scientist (life sciences)  . Smokeless tobacco: Not on file  . Alcohol Use: No    Review of Systems  DATA OBTAINED: from patient, nurse GENERAL:  no fevers, fatigue, appetite changes SKIN: No itching, rash HEENT: No complaint RESPIRATORY: No cough, wheezing, SOB CARDIAC: No chest pain, palpitations, lower extremity edema  GI: No abdominal pain, No N/V/D or constipation, No heartburn or reflux  GU: No dysuria, frequency or urgency, or incontinence  MUSCULOSKELETAL: No unrelieved bone/joint pain NEUROLOGIC: No headache, dizziness  PSYCHIATRIC: No overt anxiety or sadness  Filed Vitals:   12/30/15 1320  BP: 103/65  Pulse: 93  Temp: 98 F (36.7 C)  Resp: 20    Physical Exam  GENERAL APPEARANCE: Alert, conversant, No acute distress  SKIN: No diaphoresis rash HEENT: Unremarkable RESPIRATORY: Breathing is even, unlabored. Lung sounds are clear   CARDIOVASCULAR: Heart RRR no murmurs, rubs or gallops. No peripheral edema  GASTROINTESTINAL: Abdomen is soft, non-tender, not distended w/ normal bowel sounds.  GENITOURINARY: Bladder non tender, not distended  MUSCULOSKELETAL: No abnormal joints or musculature NEUROLOGIC: Cranial nerves 2-12 grossly intact. Moves all extremities PSYCHIATRIC: Mood and affect appropriate to situation, no behavioral issues  Patient Active Problem List   Diagnosis Date Noted  . Major depressive disorder, recurrent episode ([place]) 01/14/2015  . Hyperlipidemia associated with type 2 diabetes mellitus ([place]) 06/03/2014  . COPD bronchitis 06/03/2014  . Vascular dementia with paranoia ([place]) 06/03/2014  . Diabetes mellitus type II, controlled, with no complications ([place]) [id number]  . Anxiety state 01/20/2013  . Psychosis 01/20/2013  . Hypokalemia 01/20/2013  . Low back pain 01/20/2013  . G E R D 01/16/2008    CBC    Component Value Date/Time   WBC 10.6 11/08/2015   WBC 13.7* 06/07/2011 0630   RBC 4.50 06/07/2011 0630   RBC 3.27* 01/11/2008  0520   HGB 12.2 11/08/2015   HCT 41 11/08/2015   PLT 377 11/08/2015   MCV 90.2 06/07/2011 0630   LYMPHSABS 1.3 06/07/2011 0630   MONOABS 1.4* 06/07/2011 0630   EOSABS 0.4 06/07/2011 0630   BASOSABS 0.1 06/07/2011 0630    CMP     Component Value Date/Time   NA 135* 11/08/2015   NA 133* 06/07/2011 0630   K 4.0 11/08/2015   CL 95* 06/07/2011 0630   CO2 26 06/07/2011 0630   GLUCOSE 143* 06/07/2011 0630   BUN 15 11/08/2015   BUN 12 06/07/2011 0630   CREATININE 0.6 11/08/2015   CREATININE 0.41* 06/07/2011 0630  CALCIUM 9.7 06/07/2011 0630   PROT 7.0 06/01/2011 1552   ALBUMIN 3.7 06/01/2011 1552   AST 14 11/08/2015   ALT 12 11/08/2015   ALKPHOS 90 11/08/2015   BILITOT 0.3 06/01/2011 1552   GFRNONAA >90 06/07/2011 0630   GFRAA >90 06/07/2011 0630    Assessment and Plan  COPD bronchitis stable will continue advair 250/50 twice daily; and duoneb every 6 hours as needed will monitor  Hyperlipidemia associated with type 2 diabetes mellitus continue pravachol 40 mg daily will continue lopid 600 mg twice daily ldl is 76; trig 107  Low back pain presently stable; will continue lidoderm 2 patches to lower back daily; neurontin 100 mg three times daily; cymbalta 60 mg daily and ultram 100 mg every 6 hours as needed and will monitor her status.     Noah Delaine. Sheppard Coil, MD

## 2016-01-02 NOTE — Assessment & Plan Note (Signed)
continue pravachol 40 mg daily will continue lopid 600 mg twice daily ldl is 76; trig 107

## 2016-01-02 NOTE — Assessment & Plan Note (Signed)
stable will continue advair 250/50 twice daily; and duoneb every 6 hours as needed will monitor

## 2016-01-02 NOTE — Assessment & Plan Note (Signed)
presently stable; will continue lidoderm 2 patches to lower back daily; neurontin 100 mg three times daily; cymbalta 60 mg daily and ultram 100 mg every 6 hours as needed and will monitor her status.

## 2016-01-14 DIAGNOSIS — B351 Tinea unguium: Secondary | ICD-10-CM | POA: Diagnosis not present

## 2016-01-14 DIAGNOSIS — M79671 Pain in right foot: Secondary | ICD-10-CM | POA: Diagnosis not present

## 2016-01-14 DIAGNOSIS — M79672 Pain in left foot: Secondary | ICD-10-CM | POA: Diagnosis not present

## 2016-01-14 DIAGNOSIS — E119 Type 2 diabetes mellitus without complications: Secondary | ICD-10-CM | POA: Diagnosis not present

## 2016-01-14 LAB — HM DIABETES FOOT EXAM

## 2016-02-04 ENCOUNTER — Non-Acute Institutional Stay (SKILLED_NURSING_FACILITY): Payer: Medicare Other | Admitting: Adult Health

## 2016-02-04 ENCOUNTER — Encounter: Payer: Self-pay | Admitting: Adult Health

## 2016-02-04 DIAGNOSIS — E1169 Type 2 diabetes mellitus with other specified complication: Secondary | ICD-10-CM

## 2016-02-04 DIAGNOSIS — E119 Type 2 diabetes mellitus without complications: Secondary | ICD-10-CM | POA: Diagnosis not present

## 2016-02-04 DIAGNOSIS — F339 Major depressive disorder, recurrent, unspecified: Secondary | ICD-10-CM | POA: Diagnosis not present

## 2016-02-04 DIAGNOSIS — M545 Low back pain: Secondary | ICD-10-CM

## 2016-02-04 DIAGNOSIS — J449 Chronic obstructive pulmonary disease, unspecified: Secondary | ICD-10-CM | POA: Diagnosis not present

## 2016-02-04 DIAGNOSIS — F0150 Vascular dementia without behavioral disturbance: Secondary | ICD-10-CM

## 2016-02-04 DIAGNOSIS — F0151 Vascular dementia with behavioral disturbance: Secondary | ICD-10-CM

## 2016-02-04 DIAGNOSIS — F0152 Vascular dementia, unspecified severity, with psychotic disturbance: Secondary | ICD-10-CM

## 2016-02-04 DIAGNOSIS — F22 Delusional disorders: Secondary | ICD-10-CM

## 2016-02-04 DIAGNOSIS — E785 Hyperlipidemia, unspecified: Secondary | ICD-10-CM

## 2016-02-04 DIAGNOSIS — IMO0001 Reserved for inherently not codable concepts without codable children: Secondary | ICD-10-CM

## 2016-02-04 NOTE — Progress Notes (Signed)
Patient ID: [name] [name], female   DOB: [birth year], 75 y.o.   MRN: [id number]    Location:     [facility] Room Number: 232-B Place of Service:  SNF (31)   CODE STATUS: DNR  Allergies  Allergen Reactions  . Avelox [Moxifloxacin Hcl In Nacl] Hives  . Codeine     Reports her throat swelled in the past but has tolerated since.  . Compazine [Prochlorperazine Edisylate] Nausea And Vomiting  . Morphine And Related     Patient doesn't recall  . Mycobacterium   . Penicillins     Patient doesn't recall.  . Phenothiazines     Patient doesn't recall  . Prednisone     Insomnia, confusion.  Marland Kitchen Reglan [Metoclopramide]     Patient doesn't recall "but it was awful"  . Sulfonamide Derivatives Hives    Chief Complaint  Patient presents with  . Medical Management of Chronic Issues    Follow up    HPI:  She is a long term resident of this facility being seen for the management of her chronic illnesses. Overall her status is stable. She is voicing any complaints; states that she is feeling good. There are no nursing concerns at this time.   Past Medical History  Diagnosis Date  . Depressive disorder, not elsewhere classified   . Chronic airway obstruction, not elsewhere classified   . Spinal stenosis   . Lumbago   . Esophageal reflux   . Anxiety state, unspecified   . Abnormality of gait   . Candidiasis of other urogenital sites   . Osteoporosis     Past Surgical History  Procedure Laterality Date  . Cholecystectomy    . Appendectomy    . Abdominal hysterectomy    . Vertebroplasty      Social History   Social History  . Marital Status: Divorced    Spouse Name: N/A  . Number of Children: N/A  . Years of Education: N/A   Occupational History  . Not on file.   Social History Main Topics  . Smoking status: Former Research scientist (life sciences)  . Smokeless tobacco: Not on file  . Alcohol Use: No  . Drug Use: No  . Sexual Activity: No   Other Topics Concern  . Not on file    Social History Narrative   History reviewed. No pertinent family history.    VITAL SIGNS BP 124/82 mmHg  Pulse 93  Temp(Src) 98 F (36.7 C) (Oral)  Resp 20  Ht 5\' 2"  (1.575 m)  Wt 202 lb (91.627 kg)  BMI 36.94 kg/m2  SpO2 98%  Patient's Medications  New Prescriptions   No medications on file  Previous Medications   ACETAMINOPHEN (TYLENOL) 325 MG TABLET    Take 325 mg by mouth every 4 (four) hours as needed.    ASPIRIN EC 81 MG TABLET    Take 1 tablet (81 mg total) by mouth daily.   CHOLECALCIFEROL 10000 UNITS TABS    Take 2 tablets by mouth daily.   DIPHENHYDRAMINE (SOMINEX) 25 MG TABLET    Take 25 mg by mouth every 6 (six) hours as needed for itching.    DULOXETINE (CYMBALTA) 60 MG CAPSULE    Take 60 mg by mouth daily. For depression   FLUTICASONE-SALMETEROL (ADVAIR) 250-50 MCG/DOSE AEPB    Inhale 1 puff into the lungs every 12 (twelve) hours. For COPD   GABAPENTIN (NEURONTIN) 100 MG CAPSULE    Take 1 capsule (100 mg total) by mouth  3 (three) times daily.   GEMFIBROZIL (LOPID) 600 MG TABLET    Take 600 mg by mouth 2 (two) times daily before a meal.   IPRATROPIUM-ALBUTEROL (DUONEB) 0.5-2.5 (3) MG/3ML SOLN    Take 3 mLs by nebulization every 6 (six) hours as needed (shortness of breath).   LIDOCAINE (LIDODERM) 5 %    Place 2 patches onto the skin every morning. Apply two patches to lower back. Remove & Discard patch after 12 hours.   LISINOPRIL (PRINIVIL,ZESTRIL) 5 MG TABLET    Take 0.5 tablets (2.5 mg total) by mouth daily.   MEMANTINE HCL-DONEPEZIL HCL (NAMZARIC) 28-10 MG CP24    Take 1 capsule by mouth daily. For dementia   POTASSIUM CHLORIDE SA (K-DUR,KLOR-CON) 20 MEQ TABLET    Take 40 mEq by mouth daily. Muscle weakness   PRAVASTATIN (PRAVACHOL) 40 MG TABLET    Take 40 mg by mouth daily.   RANITIDINE (ZANTAC) 150 MG TABLET    Take 150 mg by mouth 2 (two) times daily. For reflux   TRAZODONE (DESYREL) 25 MG TABS TABLET    Take 25 mg by mouth at bedtime.  Modified  Medications   No medications on file  Discontinued Medications   TRAMADOL (ULTRAM) 50 MG TABLET    Take two tablets by mouth every 6 hours as needed for pain     SIGNIFICANT DIAGNOSTIC EXAMS  12-06-13: hgb a1c 7.2  12-20-14: chol 203; ldl 124; trig 89; hdl 62   03-20-15: wbc 8.7; hgb 12.8; hct 42.9; mcv 93; plt 365; glucose 121; bun 28.9; creat 0.64; k+ 4.9; na++143; liver normal albumin 3.7  04-20-15: chol 160; ldl 81; trig 123; hdl 55; hgb a1c 7.6  11-08-15:wbc 10.6; hgb 12.2; hct 40.7; mcv 89.;4 plt 377; glucose 205; un 15.3; creat 0.56; k+ 4.0; na++135; liver normal albumin 4.0; hgb a1c 7.7  chol 154; ldl 76; trig 107; hdl 57     Review of Systems Constitutional: Negative for appetite change and fatigue.  HENT: Negative for congestion.   Respiratory: Negative for cough, chest tightness and shortness of breath.   Cardiovascular: Negative for chest pain, palpitations and leg swelling.  Gastrointestinal: Negative for nausea, abdominal pain, diarrhea and constipation.  Musculoskeletal: Negative for myalgias and arthralgias.  Skin: Negative for pallor.  Neurological: Negative for dizziness.  Psychiatric/Behavioral: The patient is not nervous/anxious.       Physical Exam Constitutional: She is oriented to person, place, and time. No distress.  Overweight   Eyes: Conjunctivae are normal.  Neck: Neck supple. No JVD present. No thyromegaly present.  Cardiovascular: Normal rate, regular rhythm and intact distal pulses.   Respiratory: Effort normal and breath sounds normal. No respiratory distress. She has no wheezes.  GI: Soft. Bowel sounds are normal. She exhibits no distension. There is no tenderness.  Musculoskeletal: She exhibits no edema.  Able to move all extremities   Lymphadenopathy:    She has no cervical adenopathy.  Neurological: She is alert and oriented to person, place, and time.  Skin: Skin is warm and dry. She is not diaphoretic.  Psychiatric: She has a normal mood  and affect.       ASSESSMENT/ PLAN:  1. COPD: she is stable will continue advair 250/50 twice daily; and duoneb every 6 hours as needed will monitor  2. Dyslipidemia: will continue pravachol  40 mg daily will continue lopid 600 mg twice daily ldl is 76; trig 107   3. Hypokalemia: will continue k+ 40 meq daily  4.  Vascular dementia: without change in her status; will continue namzaric 28-10 mg daily and will monitor   5. Gerd: will continue zantac 150 mg twice daily   6. Chronic low back pain: she is presently stable; will continue lidoderm 2 patches to lower back daily; neurontin 100 mg three times daily; cymbalta 60 mg daily  will monitor her status.   7. Anxiety: she is stable and is benefiting from cymbalta 60 mg daily will not make changes will monitor   8. Psychosis: she has been off the risperdal for a prolonged period of time and  is no longer on medications; will continue to monitor her status.   9. Diabetes: is presently not on medications; her hgb a1c is 7.7.  will continue lisinopril 2.5 mg daily and asa 81 mg daily   Will not make changes; may need to consider starting medication in the future if her a1c continues to climb      Ok Edwards NP [facility] [phone] Monday through Friday 8am- 5pm  After hours call [phone]

## 2016-02-14 DIAGNOSIS — F329 Major depressive disorder, single episode, unspecified: Secondary | ICD-10-CM | POA: Diagnosis not present

## 2016-02-14 DIAGNOSIS — G4700 Insomnia, unspecified: Secondary | ICD-10-CM | POA: Diagnosis not present

## 2016-02-14 DIAGNOSIS — F0390 Unspecified dementia without behavioral disturbance: Secondary | ICD-10-CM | POA: Diagnosis not present

## 2016-03-05 ENCOUNTER — Encounter: Payer: Self-pay | Admitting: Internal Medicine

## 2016-03-05 NOTE — Progress Notes (Signed)
Opened in error

## 2016-03-09 ENCOUNTER — Non-Acute Institutional Stay (SKILLED_NURSING_FACILITY): Payer: Medicare Other | Admitting: Internal Medicine

## 2016-03-09 ENCOUNTER — Encounter: Payer: Self-pay | Admitting: Internal Medicine

## 2016-03-09 DIAGNOSIS — F22 Delusional disorders: Secondary | ICD-10-CM

## 2016-03-09 DIAGNOSIS — F0152 Vascular dementia, unspecified severity, with psychotic disturbance: Secondary | ICD-10-CM

## 2016-03-09 DIAGNOSIS — F0151 Vascular dementia with behavioral disturbance: Secondary | ICD-10-CM | POA: Diagnosis not present

## 2016-03-09 DIAGNOSIS — K219 Gastro-esophageal reflux disease without esophagitis: Secondary | ICD-10-CM | POA: Diagnosis not present

## 2016-03-09 DIAGNOSIS — F0150 Vascular dementia without behavioral disturbance: Secondary | ICD-10-CM

## 2016-03-09 DIAGNOSIS — E876 Hypokalemia: Secondary | ICD-10-CM

## 2016-03-09 NOTE — Progress Notes (Signed)
MRN: [id number] Name: [name] [name]  Sex: female Age: 75 y.o. DOB: 1941-07-30  [place] #:  Facility/Room: [facility] / 232 B Level Of Care: SNF Provider: Noah Delaine. Sheppard Coil, MD Emergency Contacts: Extended Emergency Contact Information Primary Emergency Contact: [name]) Address: [address]          [name] Phone: [phone] Relation: Son Secondary Emergency Contact: Charletta Cousin [place] Phone: [phone] Work Phone: [phone] Relation: Son  Code Status: Full Code  Allergies: Avelox [moxifloxacin hcl in nacl]; Codeine; Compazine [prochlorperazine edisylate]; Morphine and related; Mycobacterium; Penicillins; Phenothiazines; Prednisone; Reglan [metoclopramide]; and Sulfonamide derivatives  Chief Complaint  Patient presents with  . Medical Management of Chronic Issues    Routine Visit    HPI: Patient is 75 y.o. female who is being seen for routine issues of GERD, vascular dementia and hypokalemia.  Past Medical History:  Diagnosis Date  . Abnormality of gait   . Anxiety state, unspecified   . Candidiasis of other urogenital sites   . Chronic airway obstruction, not elsewhere classified   . Depressive disorder, not elsewhere classified   . Esophageal reflux   . Lumbago   . Osteoporosis   . Spinal stenosis     Past Surgical History:  Procedure Laterality Date  . ABDOMINAL HYSTERECTOMY    . APPENDECTOMY    . CHOLECYSTECTOMY    . VERTEBROPLASTY        Medication List       Accurate as of 03/09/16  9:48 AM. Always use your most recent med list.          acetaminophen 325 MG tablet Commonly known as:  TYLENOL Take 325 mg by mouth every 4 (four) hours as needed (pain).   aspirin EC 81 MG tablet Take 1 tablet (81 mg total) by mouth daily.   CALAZIME SKIN PROTECTANT EX Apply to buttocks every day and night shift for skin integrity   Cholecalciferol 10000 units Tabs Take 2  tablets by mouth daily.   diphenhydrAMINE 25 MG tablet Commonly known as:  SOMINEX Take 25 mg by mouth every 6 (six) hours as needed for itching.   DULoxetine 60 MG capsule Commonly known as:  CYMBALTA Take 60 mg by mouth daily. For depression   Fluticasone-Salmeterol 250-50 MCG/DOSE Aepb Commonly known as:  ADVAIR Inhale 1 puff into the lungs 2 (two) times daily. For COPD   gabapentin 100 MG capsule Commonly known as:  NEURONTIN Take 1 capsule (100 mg total) by mouth 3 (three) times daily.   gemfibrozil 600 MG tablet Commonly known as:  LOPID Take 600 mg by mouth 2 (two) times daily before a meal.   ipratropium-albuterol 0.5-2.5 (3) MG/3ML Soln Commonly known as:  DUONEB Take 3 mLs by nebulization every 6 (six) hours as needed (shortness of breath).   lidocaine 5 % Commonly known as:  LIDODERM Apply two patches to lower back in evening for pain. Remove & Discard patch after 12 hours.   lisinopril 5 MG tablet Commonly known as:  PRINIVIL,ZESTRIL Take 0.5 tablets (2.5 mg total) by mouth daily.   NAMZARIC 28-10 MG Cp24 Generic drug:  Memantine HCl-Donepezil HCl Take 1 capsule by mouth every evening. For dementia   potassium chloride SA 20 MEQ tablet Commonly known as:  K-DUR,KLOR-CON Take 40 mEq by mouth daily. Muscle weakness   pravastatin 40 MG tablet Commonly known as:  PRAVACHOL Take 40 mg by mouth every evening.   ranitidine 150 MG tablet  Commonly known as:  ZANTAC Take 150 mg by mouth 2 (two) times daily. In the morning and at bedtime For reflux   traZODone 25 mg Tabs tablet Commonly known as:  DESYREL Take 25 mg by mouth at bedtime.       No orders of the defined types were placed in this encounter.   Immunization History  Administered Date(s) Administered  . Influenza Whole 05/22/2013  . Influenza-Unspecified 05/23/2014, 05/20/2015  . Pneumococcal-Unspecified 08/31/2011    Social History  Substance Use Topics  . Smoking status: Former Research scientist (life sciences)   . Smokeless tobacco: Not on file  . Alcohol use No    Review of Systems  DATA OBTAINED: from patient, nurse GENERAL:  no fevers, fatigue, appetite changes SKIN: No itching, rash HEENT: No complaint RESPIRATORY: No cough, wheezing, SOB CARDIAC: No chest pain, palpitations, lower extremity edema  GI: No abdominal pain, No N/V/D or constipation, No heartburn or reflux  GU: No dysuria, frequency or urgency, or incontinence  MUSCULOSKELETAL: No unrelieved bone/joint pain NEUROLOGIC: No headache, dizziness  PSYCHIATRIC: No overt anxiety or sadness  Vitals:   03/09/16 0850  BP: (!) 100/56  Pulse: 72  Resp: 18  Temp: 97.4 F (36.3 C)    Physical Exam  GENERAL APPEARANCE: Alert, conversant, No acute distress  SKIN: No diaphoresis rash HEENT: Unremarkable RESPIRATORY: Breathing is even, unlabored. Lung sounds are clear   CARDIOVASCULAR: Heart RRR no murmurs, rubs or gallops. No peripheral edema  GASTROINTESTINAL: Abdomen is soft, non-tender, not distended w/ normal bowel sounds.  GENITOURINARY: Bladder non tender, not distended  MUSCULOSKELETAL: No abnormal joints or musculature NEUROLOGIC: Cranial nerves 2-12 grossly intact. Moves all extremities PSYCHIATRIC: Mood and affect appropriate to situation, no behavioral issues  Patient Active Problem List   Diagnosis Date Noted  . Major depressive disorder, recurrent episode ([place]) 01/14/2015  . Hyperlipidemia associated with type 2 diabetes mellitus ([place]) 06/03/2014  . COPD bronchitis 06/03/2014  . Vascular dementia with paranoia ([place]) 06/03/2014  . Diabetes mellitus type II, controlled, with no complications ([place]) [id number]  . Anxiety state 01/20/2013  . Hypokalemia 01/20/2013  . Low back pain 01/20/2013  . G E R D 01/16/2008    CBC    Component Value Date/Time   WBC 10.6 11/08/2015   WBC 13.7 (H) 06/07/2011 0630   RBC 4.50 06/07/2011 0630   HGB 12.2 11/08/2015   HCT 41 11/08/2015   PLT 377 11/08/2015   MCV 90.2  06/07/2011 0630   LYMPHSABS 1.3 06/07/2011 0630   MONOABS 1.4 (H) 06/07/2011 0630   EOSABS 0.4 06/07/2011 0630   BASOSABS 0.1 06/07/2011 0630    CMP     Component Value Date/Time   NA 135 (A) 11/08/2015   K 4.0 11/08/2015   CL 95 (L) 06/07/2011 0630   CO2 26 06/07/2011 0630   GLUCOSE 143 (H) 06/07/2011 0630   BUN 15 11/08/2015   CREATININE 0.6 11/08/2015   CREATININE 0.41 (L) 06/07/2011 0630   CALCIUM 9.7 06/07/2011 0630   PROT 7.0 06/01/2011 1552   ALBUMIN 3.7 06/01/2011 1552   AST 14 11/08/2015   ALT 12 11/08/2015   ALKPHOS 90 11/08/2015   BILITOT 0.3 06/01/2011 1552   GFRNONAA >90 06/07/2011 0630   GFRAA >90 06/07/2011 0630    Assessment and Plan  Hypokalemia: will continue k+ 40 meq daily; last K+ is 4.0   Vascular dementia: without change in her status; will continue namzaric 28-10 mg daily and will monitor   Gerd: controlled ; plan to  continue zantac 150 mg twice daily     Jazsmin Couse D. Sheppard Coil, MD

## 2016-03-18 DIAGNOSIS — E11628 Type 2 diabetes mellitus with other skin complications: Secondary | ICD-10-CM | POA: Diagnosis not present

## 2016-03-18 DIAGNOSIS — D649 Anemia, unspecified: Secondary | ICD-10-CM | POA: Diagnosis not present

## 2016-03-18 DIAGNOSIS — E0840 Diabetes mellitus due to underlying condition with diabetic neuropathy, unspecified: Secondary | ICD-10-CM | POA: Diagnosis not present

## 2016-03-18 DIAGNOSIS — I10 Essential (primary) hypertension: Secondary | ICD-10-CM | POA: Diagnosis not present

## 2016-04-10 ENCOUNTER — Encounter: Payer: Self-pay | Admitting: Adult Health

## 2016-04-10 ENCOUNTER — Non-Acute Institutional Stay (SKILLED_NURSING_FACILITY): Payer: Medicare Other | Admitting: Adult Health

## 2016-04-10 DIAGNOSIS — M545 Low back pain: Secondary | ICD-10-CM

## 2016-04-10 DIAGNOSIS — IMO0001 Reserved for inherently not codable concepts without codable children: Secondary | ICD-10-CM

## 2016-04-10 DIAGNOSIS — K219 Gastro-esophageal reflux disease without esophagitis: Secondary | ICD-10-CM

## 2016-04-10 DIAGNOSIS — J449 Chronic obstructive pulmonary disease, unspecified: Secondary | ICD-10-CM | POA: Diagnosis not present

## 2016-04-10 DIAGNOSIS — E876 Hypokalemia: Secondary | ICD-10-CM

## 2016-04-10 DIAGNOSIS — F0151 Vascular dementia with behavioral disturbance: Secondary | ICD-10-CM

## 2016-04-10 DIAGNOSIS — E119 Type 2 diabetes mellitus without complications: Secondary | ICD-10-CM | POA: Diagnosis not present

## 2016-04-10 DIAGNOSIS — E785 Hyperlipidemia, unspecified: Secondary | ICD-10-CM

## 2016-04-10 DIAGNOSIS — F0150 Vascular dementia without behavioral disturbance: Secondary | ICD-10-CM

## 2016-04-10 DIAGNOSIS — F0152 Vascular dementia, unspecified severity, with psychotic disturbance: Secondary | ICD-10-CM

## 2016-04-10 DIAGNOSIS — F22 Delusional disorders: Secondary | ICD-10-CM | POA: Diagnosis not present

## 2016-04-10 DIAGNOSIS — E1169 Type 2 diabetes mellitus with other specified complication: Secondary | ICD-10-CM | POA: Diagnosis not present

## 2016-04-10 NOTE — Progress Notes (Signed)
Patient ID: [name] [name], female   DOB: [birth year], 75 y.o.   MRN: [id number]   Location:   [facility] Room Number: 232-B Place of Service:  SNF (31)   CODE STATUS: Full Code  Allergies  Allergen Reactions  . Avelox [Moxifloxacin Hcl In Nacl] Hives  . Codeine     Reports her throat swelled in the past but has tolerated since.  . Compazine [Prochlorperazine Edisylate] Nausea And Vomiting  . Morphine And Related     Patient doesn't recall  . Mycobacterium   . Penicillins     Patient doesn't recall.  . Phenothiazines     Patient doesn't recall  . Prednisone     Insomnia, confusion.  Marland Kitchen Reglan [Metoclopramide]     Patient doesn't recall "but it was awful"  . Sulfonamide Derivatives Hives    Chief Complaint  Patient presents with  . Medical Management of Chronic Issues    Follow up    HPI:  She is a long term resident of this facility being seen for the management of her chronic illnesses. Overall her status is stable. She does spend most of her time in bed per her choice. She tells me that she is feeling good and has no complaints. There are no  Nursing concerns at this time.    Past Medical History:  Diagnosis Date  . Abnormality of gait   . Anxiety state, unspecified   . Candidiasis of other urogenital sites   . Chronic airway obstruction, not elsewhere classified   . Depressive disorder, not elsewhere classified   . Esophageal reflux   . Lumbago   . Osteoporosis   . Spinal stenosis     Past Surgical History:  Procedure Laterality Date  . ABDOMINAL HYSTERECTOMY    . APPENDECTOMY    . CHOLECYSTECTOMY    . VERTEBROPLASTY      Social History   Social History  . Marital status: Divorced    Spouse name: N/A  . Number of children: N/A  . Years of education: N/A   Occupational History  . Not on file.   Social History Main Topics  . Smoking status: Former Research scientist (life sciences)  . Smokeless tobacco: Not on file  . Alcohol use No  . Drug use: No  . Sexual  activity: No   Other Topics Concern  . Not on file   Social History Narrative  . No narrative on file   History reviewed. No pertinent family history.    VITAL SIGNS BP 123/71   Pulse 72   Temp 97.4 F (36.3 C) (Oral)   Resp 18   Ht 5\' 2"  (1.575 m)   Wt 201 lb (91.2 kg)   SpO2 97%   BMI 36.76 kg/m   Patient's Medications  New Prescriptions   No medications on file  Previous Medications   ACETAMINOPHEN (TYLENOL) 325 MG TABLET    Take 325 mg by mouth every 4 (four) hours as needed (pain).    ASPIRIN EC 81 MG TABLET    Take 1 tablet (81 mg total) by mouth daily.   CHOLECALCIFEROL 10000 UNITS TABS    Take 2 tablets by mouth daily.   DIPHENHYDRAMINE (SOMINEX) 25 MG TABLET    Take 25 mg by mouth every 6 (six) hours as needed for itching.    DULOXETINE (CYMBALTA) 60 MG CAPSULE    Take 60 mg by mouth daily. For depression   FLUTICASONE-SALMETEROL (ADVAIR) 250-50 MCG/DOSE AEPB    Inhale 1 puff into  the lungs 2 (two) times daily. For COPD   GABAPENTIN (NEURONTIN) 100 MG CAPSULE    Take 1 capsule (100 mg total) by mouth 3 (three) times daily.   GEMFIBROZIL (LOPID) 600 MG TABLET    Take 600 mg by mouth 2 (two) times daily before a meal.    IPRATROPIUM-ALBUTEROL (DUONEB) 0.5-2.5 (3) MG/3ML SOLN    Take 3 mLs by nebulization every 6 (six) hours as needed (shortness of breath).   LIDOCAINE (LIDODERM) 5 %    Apply two patches to lower back in evening for pain. Remove & Discard patch after 12 hours.   LISINOPRIL (PRINIVIL,ZESTRIL) 5 MG TABLET    Take 0.5 tablets (2.5 mg total) by mouth daily.   MEMANTINE HCL-DONEPEZIL HCL (NAMZARIC) 28-10 MG CP24    Take 1 capsule by mouth every evening. For dementia   POTASSIUM CHLORIDE SA (K-DUR,KLOR-CON) 20 MEQ TABLET    Take 40 mEq by mouth daily. Muscle weakness   PRAVASTATIN (PRAVACHOL) 40 MG TABLET    Take 40 mg by mouth every evening.    RANITIDINE (ZANTAC) 150 MG TABLET    Take 150 mg by mouth 2 (two) times daily. In the morning and at bedtime For  reflux   SKIN PROTECTANTS, MISC. (CALAZIME SKIN PROTECTANT EX)    Apply to buttocks every day and night shift for skin integrity   TRAZODONE (DESYREL) 25 MG TABS TABLET    Take 25 mg by mouth at bedtime.   Modified Medications   No medications on file  Discontinued Medications   No medications on file     SIGNIFICANT DIAGNOSTIC EXAMS  04-20-15: chol 160; ldl 81; trig 123; hdl 55; hgb a1c 7.6  11-08-15:wbc 10.6; hgb 12.2; hct 40.7; mcv 89.;4 plt 377; glucose 205; un 15.3; creat 0.56; k+ 4.0; na++135; liver normal albumin 4.0; hgb a1c 7.7  chol 154; ldl 76; trig 107; hdl 57     Review of Systems Constitutional: Negative for appetite change and fatigue.  HENT: Negative for congestion.   Respiratory: Negative for cough, chest tightness and shortness of breath.   Cardiovascular: Negative for chest pain, palpitations and leg swelling.  Gastrointestinal: Negative for nausea, abdominal pain, diarrhea and constipation.  Musculoskeletal: Negative for myalgias and arthralgias.  Skin: Negative for pallor.  Neurological: Negative for dizziness.  Psychiatric/Behavioral: The patient is not nervous/anxious.       Physical Exam Constitutional: She is oriented to person, place, and time. No distress.  Overweight   Eyes: Conjunctivae are normal.  Neck: Neck supple. No JVD present. No thyromegaly present.  Cardiovascular: Normal rate, regular rhythm and intact distal pulses.   Respiratory: Effort normal and breath sounds normal. No respiratory distress. She has no wheezes.  GI: Soft. Bowel sounds are normal. She exhibits no distension. There is no tenderness.  Musculoskeletal: She exhibits no edema.  Able to move all extremities   Lymphadenopathy:    She has no cervical adenopathy.  Neurological: She is alert and oriented to person, place, and time.  Skin: Skin is warm and dry. She is not diaphoretic.  Psychiatric: She has a normal mood and affect.       ASSESSMENT/ PLAN:  1. COPD:  she is stable will continue advair 250/50 twice daily; and duoneb every 6 hours as needed will monitor  2. Dyslipidemia: will continue pravachol  40 mg daily will continue lopid 600 mg twice daily ldl is 76; trig 107   3. Hypokalemia: will continue k+ 40 meq daily  4. Vascular  dementia: without change in her status; will continue namzaric 28-10 mg daily and will monitor   5. Gerd: will continue zantac 150 mg twice daily   6. Chronic low back pain: she is presently stable; will continue lidoderm 2 patches to lower back daily; neurontin 100 mg three times daily; cymbalta 60 mg daily  will monitor her status.   7. Anxiety: she is stable and is benefiting from cymbalta 60 mg daily will not make changes will monitor   8. Psychosis: she has been off the risperdal for a prolonged period of time and  is no longer on medications; will continue to monitor her status.   9. Diabetes: is presently not on medications; her hgb a1c is 7.7.  will continue lisinopril 2.5 mg daily and asa 81 mg daily   Will not make changes; may need to consider starting medication in the future if her a1c continues to climb     Ok Edwards NP [facility] [phone] Monday through Friday 8am- 5pm  After hours call [phone]

## 2016-04-13 ENCOUNTER — Encounter: Payer: Self-pay | Admitting: Internal Medicine

## 2016-04-17 DIAGNOSIS — F0390 Unspecified dementia without behavioral disturbance: Secondary | ICD-10-CM | POA: Diagnosis not present

## 2016-04-20 DIAGNOSIS — F0390 Unspecified dementia without behavioral disturbance: Secondary | ICD-10-CM | POA: Diagnosis not present

## 2016-04-21 DIAGNOSIS — F0390 Unspecified dementia without behavioral disturbance: Secondary | ICD-10-CM | POA: Diagnosis not present

## 2016-04-22 DIAGNOSIS — F0390 Unspecified dementia without behavioral disturbance: Secondary | ICD-10-CM | POA: Diagnosis not present

## 2016-04-23 DIAGNOSIS — F0390 Unspecified dementia without behavioral disturbance: Secondary | ICD-10-CM | POA: Diagnosis not present

## 2016-04-24 DIAGNOSIS — F0390 Unspecified dementia without behavioral disturbance: Secondary | ICD-10-CM | POA: Diagnosis not present

## 2016-04-27 DIAGNOSIS — F0390 Unspecified dementia without behavioral disturbance: Secondary | ICD-10-CM | POA: Diagnosis not present

## 2016-04-28 DIAGNOSIS — F0390 Unspecified dementia without behavioral disturbance: Secondary | ICD-10-CM | POA: Diagnosis not present

## 2016-04-29 DIAGNOSIS — F0390 Unspecified dementia without behavioral disturbance: Secondary | ICD-10-CM | POA: Diagnosis not present

## 2016-04-30 DIAGNOSIS — F0390 Unspecified dementia without behavioral disturbance: Secondary | ICD-10-CM | POA: Diagnosis not present

## 2016-05-01 DIAGNOSIS — F0390 Unspecified dementia without behavioral disturbance: Secondary | ICD-10-CM | POA: Diagnosis not present

## 2016-05-04 DIAGNOSIS — F0390 Unspecified dementia without behavioral disturbance: Secondary | ICD-10-CM | POA: Diagnosis not present

## 2016-05-05 DIAGNOSIS — F0390 Unspecified dementia without behavioral disturbance: Secondary | ICD-10-CM | POA: Diagnosis not present

## 2016-05-06 DIAGNOSIS — F0390 Unspecified dementia without behavioral disturbance: Secondary | ICD-10-CM | POA: Diagnosis not present

## 2016-05-07 DIAGNOSIS — F0390 Unspecified dementia without behavioral disturbance: Secondary | ICD-10-CM | POA: Diagnosis not present

## 2016-05-08 DIAGNOSIS — F0390 Unspecified dementia without behavioral disturbance: Secondary | ICD-10-CM | POA: Diagnosis not present

## 2016-05-11 DIAGNOSIS — M6281 Muscle weakness (generalized): Secondary | ICD-10-CM | POA: Diagnosis not present

## 2016-05-11 DIAGNOSIS — F0390 Unspecified dementia without behavioral disturbance: Secondary | ICD-10-CM | POA: Diagnosis not present

## 2016-05-12 DIAGNOSIS — F0390 Unspecified dementia without behavioral disturbance: Secondary | ICD-10-CM | POA: Diagnosis not present

## 2016-05-12 DIAGNOSIS — M6281 Muscle weakness (generalized): Secondary | ICD-10-CM | POA: Diagnosis not present

## 2016-05-13 DIAGNOSIS — F0390 Unspecified dementia without behavioral disturbance: Secondary | ICD-10-CM | POA: Diagnosis not present

## 2016-05-13 DIAGNOSIS — M6281 Muscle weakness (generalized): Secondary | ICD-10-CM | POA: Diagnosis not present

## 2016-05-14 DIAGNOSIS — F0390 Unspecified dementia without behavioral disturbance: Secondary | ICD-10-CM | POA: Diagnosis not present

## 2016-05-14 DIAGNOSIS — M6281 Muscle weakness (generalized): Secondary | ICD-10-CM | POA: Diagnosis not present

## 2016-05-20 ENCOUNTER — Encounter: Payer: Self-pay | Admitting: Adult Health

## 2016-05-20 ENCOUNTER — Non-Acute Institutional Stay (SKILLED_NURSING_FACILITY): Payer: Medicare Other | Admitting: Adult Health

## 2016-05-20 DIAGNOSIS — F0150 Vascular dementia without behavioral disturbance: Secondary | ICD-10-CM

## 2016-05-20 DIAGNOSIS — G8929 Other chronic pain: Secondary | ICD-10-CM

## 2016-05-20 DIAGNOSIS — E785 Hyperlipidemia, unspecified: Secondary | ICD-10-CM | POA: Diagnosis not present

## 2016-05-20 DIAGNOSIS — F22 Delusional disorders: Secondary | ICD-10-CM

## 2016-05-20 DIAGNOSIS — F0151 Vascular dementia with behavioral disturbance: Secondary | ICD-10-CM

## 2016-05-20 DIAGNOSIS — E119 Type 2 diabetes mellitus without complications: Secondary | ICD-10-CM | POA: Diagnosis not present

## 2016-05-20 DIAGNOSIS — J441 Chronic obstructive pulmonary disease with (acute) exacerbation: Secondary | ICD-10-CM

## 2016-05-20 DIAGNOSIS — F339 Major depressive disorder, recurrent, unspecified: Secondary | ICD-10-CM

## 2016-05-20 DIAGNOSIS — E1169 Type 2 diabetes mellitus with other specified complication: Secondary | ICD-10-CM

## 2016-05-20 DIAGNOSIS — M545 Low back pain, unspecified: Secondary | ICD-10-CM

## 2016-05-20 DIAGNOSIS — F0152 Vascular dementia, unspecified severity, with psychotic disturbance: Secondary | ICD-10-CM

## 2016-05-20 DIAGNOSIS — E876 Hypokalemia: Secondary | ICD-10-CM

## 2016-05-20 NOTE — Progress Notes (Signed)
Patient ID: [name] [name], female   DOB: 07-30-1941, 75 y.o.   MRN: [id number]    Location:   [facility] Room Number: 232-B Place of Service:  SNF (31)   CODE STATUS: Full Code  Allergies  Allergen Reactions  . Avelox [Moxifloxacin Hcl In Nacl] Hives  . Codeine     Reports her throat swelled in the past but has tolerated since.  . Compazine [Prochlorperazine Edisylate] Nausea And Vomiting  . Morphine And Related     Patient doesn't recall  . Mycobacterium   . Penicillins     Patient doesn't recall.  . Phenothiazines     Patient doesn't recall  . Prednisone     Insomnia, confusion.  Marland Kitchen Reglan [Metoclopramide]     Patient doesn't recall "but it was awful"  . Sulfonamide Derivatives Hives    Chief Complaint  Patient presents with  . Medical Management of Chronic Issues    Follow up    HPI:  She is a long term resident of this facility being seen for the management of her chronic illnesses. Overall her status is stable. She does spend nealry off her time in bed per her choice. She is not voicing any complaints. There are no nursing concerns at this time.    Past Medical History:  Diagnosis Date  . Abnormality of gait   . Anxiety state, unspecified   . Candidiasis of other urogenital sites   . Chronic airway obstruction, not elsewhere classified   . Depressive disorder, not elsewhere classified   . Esophageal reflux   . Lumbago   . Osteoporosis   . Spinal stenosis     Past Surgical History:  Procedure Laterality Date  . ABDOMINAL HYSTERECTOMY    . APPENDECTOMY    . CHOLECYSTECTOMY    . VERTEBROPLASTY      Social History   Social History  . Marital status: Divorced    Spouse name: N/A  . Number of children: N/A  . Years of education: N/A   Occupational History  . Not on file.   Social History Main Topics  . Smoking status: Former Research scientist (life sciences)  . Smokeless tobacco: Not on file  . Alcohol use No  . Drug use: No  . Sexual activity: No    Other Topics Concern  . Not on file   Social History Narrative  . No narrative on file   History reviewed. No pertinent family history.    VITAL SIGNS BP 138/70   Pulse 72   Temp 97.2 F (36.2 C) (Oral)   Resp 18   Ht 5\' 2"  (1.575 m)   Wt 200 lb (90.7 kg)   SpO2 95%   BMI 36.58 kg/m   Patient's Medications  New Prescriptions   No medications on file  Previous Medications   ACETAMINOPHEN (TYLENOL) 325 MG TABLET    Take 325 mg by mouth every 4 (four) hours as needed (pain).    ASPIRIN EC 81 MG TABLET    Take 1 tablet (81 mg total) by mouth daily.   CHOLECALCIFEROL 10000 UNITS TABS    Take 2 tablets by mouth daily.   DULOXETINE (CYMBALTA) 60 MG CAPSULE    Take 60 mg by mouth daily. For depression   FLUTICASONE-SALMETEROL (ADVAIR) 250-50 MCG/DOSE AEPB    Inhale 1 puff into the lungs 2 (two) times daily. For COPD   GABAPENTIN (NEURONTIN) 100 MG CAPSULE    Take 1 capsule (100 mg total) by mouth 3 (three) times daily.  GEMFIBROZIL (LOPID) 600 MG TABLET    Take 600 mg by mouth 2 (two) times daily before a meal.    LIDOCAINE (LIDODERM) 5 %    Apply two patches to lower back in evening for pain. Remove & Discard patch after 12 hours.   LISINOPRIL (PRINIVIL,ZESTRIL) 5 MG TABLET    Take 0.5 tablets (2.5 mg total) by mouth daily.   MEMANTINE HCL-DONEPEZIL HCL (NAMZARIC) 28-10 MG CP24    Take 1 capsule by mouth every evening. For dementia   POTASSIUM CHLORIDE SA (K-DUR,KLOR-CON) 20 MEQ TABLET    Take 40 mEq by mouth daily. Muscle weakness   PRAVASTATIN (PRAVACHOL) 40 MG TABLET    Take 40 mg by mouth every evening.    RANITIDINE (ZANTAC) 150 MG TABLET    Take 150 mg by mouth 2 (two) times daily. In the morning and at bedtime For reflux   SKIN PROTECTANTS, MISC. (CALAZIME SKIN PROTECTANT EX)    Apply to buttocks every day and night shift for skin integrity   TRAZODONE (DESYREL) 25 MG TABS TABLET    Take 25 mg by mouth at bedtime.   Modified Medications   No medications on file   Discontinued Medications   DIPHENHYDRAMINE (SOMINEX) 25 MG TABLET    Take 25 mg by mouth every 6 (six) hours as needed for itching.    IPRATROPIUM-ALBUTEROL (DUONEB) 0.5-2.5 (3) MG/3ML SOLN    Take 3 mLs by nebulization every 6 (six) hours as needed (shortness of breath).     SIGNIFICANT DIAGNOSTIC EXAMS  04-20-15: chol 160; ldl 81; trig 123; hdl 55; hgb a1c 7.6  11-08-15:wbc 10.6; hgb 12.2; hct 40.7; mcv 89.;4 plt 377; glucose 205; un 15.3; creat 0.56; k+ 4.0; na++135; liver normal albumin 4.0; hgb a1c 7.7  chol 154; ldl 76; trig 107; hdl 57     Review of Systems Constitutional: Negative for appetite change and fatigue.  HENT: Negative for congestion.   Respiratory: Negative for cough, chest tightness and shortness of breath.   Cardiovascular: Negative for chest pain, palpitations and leg swelling.  Gastrointestinal: Negative for nausea, abdominal pain, diarrhea and constipation.  Musculoskeletal: Negative for myalgias and arthralgias.  Skin: Negative for pallor.  Neurological: Negative for dizziness.  Psychiatric/Behavioral: The patient is not nervous/anxious.       Physical Exam Constitutional: She is oriented to person, place, and time. No distress.  Overweight   Eyes: Conjunctivae are normal.  Neck: Neck supple. No JVD present. No thyromegaly present.  Cardiovascular: Normal rate, regular rhythm and intact distal pulses.   Respiratory: Effort normal and breath sounds normal. No respiratory distress. She has no wheezes.  GI: Soft. Bowel sounds are normal. She exhibits no distension. There is no tenderness.  Musculoskeletal: She exhibits no edema.  Able to move all extremities   Lymphadenopathy:    She has no cervical adenopathy.  Neurological: She is alert and oriented to person, place, and time.  Skin: Skin is warm and dry. She is not diaphoretic.  Psychiatric: She has a normal mood and affect.       ASSESSMENT/ PLAN:  1. COPD: she is stable will continue advair  250/50 twice daily; ad will monitor  2. Dyslipidemia: will continue pravachol  40 mg daily will continue lopid 600 mg twice daily ldl is 76; trig 107   3. Hypokalemia: will continue k+ 40 meq daily  4. Vascular dementia: without change in her status; will continue namzaric 28-10 mg daily and will monitor   5. Gerd: will  continue zantac 150 mg twice daily   6. Chronic low back pain: she is presently stable; will continue lidoderm 2 patches to lower back daily; neurontin 100 mg three times daily; cymbalta 60 mg daily  will monitor her status.   7. Anxiety: she is stable and is benefiting from cymbalta 60 mg daily will not make changes will monitor   8. Psychosis: she has been off the risperdal for a prolonged period of time and  is no longer on medications; will continue to monitor her status.   9. Diabetes: is presently not on medications; her hgb a1c is 7.7.  will continue lisinopril 2.5 mg daily and asa 81 mg daily   Will not make changes; may need to consider starting medication in the future if her a1c continues to climb    Will check cbc; cmp lipids; hgb a1c   Ok Edwards NP [facility]  Contact [phone] Monday through Friday 8am- 5pm  After hours call [phone]

## 2016-05-21 DIAGNOSIS — E11618 Type 2 diabetes mellitus with other diabetic arthropathy: Secondary | ICD-10-CM | POA: Diagnosis not present

## 2016-05-21 DIAGNOSIS — E785 Hyperlipidemia, unspecified: Secondary | ICD-10-CM | POA: Diagnosis not present

## 2016-05-21 DIAGNOSIS — J449 Chronic obstructive pulmonary disease, unspecified: Secondary | ICD-10-CM | POA: Diagnosis not present

## 2016-05-21 LAB — CBC AND DIFFERENTIAL
HEMATOCRIT: 40 % (ref 36–46)
Hemoglobin: 13 g/dL (ref 12.0–16.0)
PLATELETS: 355 10*3/uL (ref 150–399)
WBC: 9.4 10^3/mL

## 2016-05-21 LAB — LIPID PANEL
Cholesterol: 158 mg/dL (ref 0–200)
HDL: 52 mg/dL (ref 35–70)
LDL CALC: 90 mg/dL
TRIGLYCERIDES: 81 mg/dL (ref 40–160)

## 2016-05-21 LAB — BASIC METABOLIC PANEL
BUN: 13 mg/dL (ref 4–21)
Creatinine: 0.6 mg/dL (ref 0.5–1.1)
GLUCOSE: 200 mg/dL
Potassium: 4.4 mmol/L (ref 3.4–5.3)
Sodium: 138 mmol/L (ref 137–147)

## 2016-05-21 LAB — HEPATIC FUNCTION PANEL
ALK PHOS: 105 U/L (ref 25–125)
ALT: 9 U/L (ref 7–35)
AST: 12 U/L — AB (ref 13–35)
BILIRUBIN, TOTAL: 0.2 mg/dL

## 2016-05-21 LAB — HEMOGLOBIN A1C: Hemoglobin A1C: 9

## 2016-05-29 ENCOUNTER — Non-Acute Institutional Stay (SKILLED_NURSING_FACILITY): Payer: Medicare Other | Admitting: Adult Health

## 2016-05-29 ENCOUNTER — Encounter: Payer: Self-pay | Admitting: Adult Health

## 2016-05-29 DIAGNOSIS — J441 Chronic obstructive pulmonary disease with (acute) exacerbation: Secondary | ICD-10-CM | POA: Diagnosis not present

## 2016-05-29 NOTE — Progress Notes (Signed)
Patient ID: [name] [name], female   DOB: 06-05-1941, 75 y.o.   MRN: [id number]   Location:   [facility] Room Number: 232-B Place of Service:  SNF (31)   CODE STATUS: Full Code  Allergies  Allergen Reactions  . Avelox [Moxifloxacin Hcl In Nacl] Hives  . Codeine     Reports her throat swelled in the past but has tolerated since.  . Compazine [Prochlorperazine Edisylate] Nausea And Vomiting  . Morphine And Related     Patient doesn't recall  . Mycobacterium   . Penicillins     Patient doesn't recall.  . Phenothiazines     Patient doesn't recall  . Prednisone     Insomnia, confusion.  Marland Kitchen Reglan [Metoclopramide]     Patient doesn't recall "but it was awful"  . Sulfonamide Derivatives Hives    Chief Complaint  Patient presents with  . Acute Visit    Congestion    HPI:  She reports that she is having a cough. She states that the cough is occasional; she denies any shortness of breath and denies any chest pain. There are no reports of fever present.   Past Medical History:  Diagnosis Date  . Abnormality of gait   . Anxiety state, unspecified   . Candidiasis of other urogenital sites   . Chronic airway obstruction, not elsewhere classified   . Depressive disorder, not elsewhere classified   . Esophageal reflux   . Lumbago   . Osteoporosis   . Spinal stenosis     Past Surgical History:  Procedure Laterality Date  . ABDOMINAL HYSTERECTOMY    . APPENDECTOMY    . CHOLECYSTECTOMY    . VERTEBROPLASTY      Social History   Social History  . Marital status: Divorced    Spouse name: N/A  . Number of children: N/A  . Years of education: N/A   Occupational History  . Not on file.   Social History Main Topics  . Smoking status: Former Research scientist (life sciences)  . Smokeless tobacco: Not on file  . Alcohol use No  . Drug use: No  . Sexual activity: No   Other Topics Concern  . Not on file   Social History Narrative  . No narrative on file   History reviewed. No  pertinent family history.    VITAL SIGNS BP 122/61   Pulse 72   Temp 97.2 F (36.2 C) (Oral)   Resp 18   Ht 5\' 2"  (1.575 m)   Wt 200 lb 8 oz (90.9 kg)   SpO2 95%   BMI 36.67 kg/m   Patient's Medications  New Prescriptions   No medications on file  Previous Medications   ACETAMINOPHEN (TYLENOL) 325 MG TABLET    Take 325 mg by mouth every 4 (four) hours as needed (pain).    ASPIRIN EC 81 MG TABLET    Take 1 tablet (81 mg total) by mouth daily.   CHOLECALCIFEROL 10000 UNITS TABS    Take 2 tablets by mouth daily.   DULOXETINE (CYMBALTA) 60 MG CAPSULE    Take 60 mg by mouth daily. For depression   FLUTICASONE-SALMETEROL (ADVAIR) 250-50 MCG/DOSE AEPB    Inhale 1 puff into the lungs 2 (two) times daily. For COPD   GABAPENTIN (NEURONTIN) 100 MG CAPSULE    Take 1 capsule (100 mg total) by mouth 3 (three) times daily.   GEMFIBROZIL (LOPID) 600 MG TABLET    Take 600 mg by mouth 2 (two) times daily before  a meal.    LIDOCAINE (LIDODERM) 5 %    Apply two patches to lower back in evening for pain. Remove & Discard patch after 12 hours.   LISINOPRIL (PRINIVIL,ZESTRIL) 5 MG TABLET    Take 0.5 tablets (2.5 mg total) by mouth daily.   MEMANTINE HCL-DONEPEZIL HCL (NAMZARIC) 28-10 MG CP24    Take 1 capsule by mouth every evening. For dementia   POTASSIUM CHLORIDE SA (K-DUR,KLOR-CON) 20 MEQ TABLET    Take 40 mEq by mouth daily. Muscle weakness   PRAVASTATIN (PRAVACHOL) 40 MG TABLET    Take 40 mg by mouth every evening.    RANITIDINE (ZANTAC) 150 MG TABLET    Take 150 mg by mouth 2 (two) times daily. In the morning and at bedtime For reflux   SKIN PROTECTANTS, MISC. (CALAZIME SKIN PROTECTANT EX)    Apply to buttocks every day and night shift for skin integrity   TRAZODONE (DESYREL) 25 MG TABS TABLET    Take 25 mg by mouth at bedtime.   Modified Medications   No medications on file  Discontinued Medications   No medications on file     SIGNIFICANT DIAGNOSTIC EXAMS    LABS REVIEWED:     11-08-15:wbc 10.6; hgb 12.2; hct 40.7; mcv 89.;4 plt 377; glucose 205; un 15.3; creat 0.56; k+ 4.0; na++135; liver normal albumin 4.0; hgb a1c 7.7  chol 154; ldl 76; trig 107; hdl 57     Review of Systems Constitutional: Negative for appetite change and fatigue.  HENT: Negative for congestion.   Respiratory: Negative for cough, chest tightness and shortness of breath.   Cardiovascular: Negative for chest pain, palpitations and leg swelling.  Gastrointestinal: Negative for nausea, abdominal pain, diarrhea and constipation.  Musculoskeletal: Negative for myalgias and arthralgias.  Skin: Negative for pallor.  Neurological: Negative for dizziness.  Psychiatric/Behavioral: The patient is not nervous/anxious.       Physical Exam Constitutional: She is oriented to person, place, and time. No distress.  Overweight   Eyes: Conjunctivae are normal.  Neck: Neck supple. No JVD present. No thyromegaly present.  Cardiovascular: Normal rate, regular rhythm and intact distal pulses.   Respiratory: Effort normal and breath sounds normal. No respiratory distress. Has few scattered wheeezes.  GI: Soft. Bowel sounds are normal. She exhibits no distension. There is no tenderness.  Musculoskeletal: She exhibits no edema.  Able to move all extremities   Lymphadenopathy:    She has no cervical adenopathy.  Neurological: She is alert and oriented to person, place, and time.  Skin: Skin is warm and dry. She is not diaphoretic.  Psychiatric: She has a normal mood and affect.     ASSESSMENT/PLAN   1. COPD: she is stable will continue advair 250/50 twice daily; and duoneb every 6 hours as needed  Will begin mucinex twice daily for 3 days then as needed and will monitor her status.      [name] Edwards NP [facility]  Contact [phone] Monday through Friday 8am- 5pm  After hours call [phone]

## 2016-06-04 DIAGNOSIS — F0390 Unspecified dementia without behavioral disturbance: Secondary | ICD-10-CM | POA: Diagnosis not present

## 2016-06-04 DIAGNOSIS — F329 Major depressive disorder, single episode, unspecified: Secondary | ICD-10-CM | POA: Diagnosis not present

## 2016-06-04 DIAGNOSIS — G4700 Insomnia, unspecified: Secondary | ICD-10-CM | POA: Diagnosis not present

## 2016-06-23 ENCOUNTER — Encounter: Payer: Self-pay | Admitting: Adult Health

## 2016-06-23 ENCOUNTER — Non-Acute Institutional Stay (SKILLED_NURSING_FACILITY): Payer: Medicare Other | Admitting: Adult Health

## 2016-06-23 DIAGNOSIS — E1165 Type 2 diabetes mellitus with hyperglycemia: Secondary | ICD-10-CM

## 2016-06-23 DIAGNOSIS — M545 Low back pain, unspecified: Secondary | ICD-10-CM

## 2016-06-23 DIAGNOSIS — F22 Delusional disorders: Secondary | ICD-10-CM

## 2016-06-23 DIAGNOSIS — F339 Major depressive disorder, recurrent, unspecified: Secondary | ICD-10-CM

## 2016-06-23 DIAGNOSIS — J441 Chronic obstructive pulmonary disease with (acute) exacerbation: Secondary | ICD-10-CM

## 2016-06-23 DIAGNOSIS — F0151 Vascular dementia with behavioral disturbance: Secondary | ICD-10-CM

## 2016-06-23 DIAGNOSIS — G8929 Other chronic pain: Secondary | ICD-10-CM

## 2016-06-23 DIAGNOSIS — E876 Hypokalemia: Secondary | ICD-10-CM

## 2016-06-23 DIAGNOSIS — E1139 Type 2 diabetes mellitus with other diabetic ophthalmic complication: Secondary | ICD-10-CM

## 2016-06-23 DIAGNOSIS — E1169 Type 2 diabetes mellitus with other specified complication: Secondary | ICD-10-CM

## 2016-06-23 DIAGNOSIS — E785 Hyperlipidemia, unspecified: Secondary | ICD-10-CM

## 2016-06-23 DIAGNOSIS — K219 Gastro-esophageal reflux disease without esophagitis: Secondary | ICD-10-CM | POA: Diagnosis not present

## 2016-06-23 DIAGNOSIS — F0152 Vascular dementia, unspecified severity, with psychotic disturbance: Secondary | ICD-10-CM

## 2016-06-23 DIAGNOSIS — F0150 Vascular dementia without behavioral disturbance: Secondary | ICD-10-CM

## 2016-06-23 NOTE — Progress Notes (Signed)
Patient ID: [name] [name], female   DOB: 1941-04-24, 75 y.o.   MRN: [id number]   Location:   [place] Room Number: 232-B Place of Service:  SNF (31)   CODE STATUS: Full Code  Allergies  Allergen Reactions  . Avelox [Moxifloxacin Hcl In Nacl] Hives  . Codeine     Reports her throat swelled in the past but has tolerated since.  . Compazine [Prochlorperazine Edisylate] Nausea And Vomiting  . Morphine And Related     Patient doesn't recall  . Mycobacterium   . Penicillins     Patient doesn't recall.  . Phenothiazines     Patient doesn't recall  . Prednisone     Insomnia, confusion.  Marland Kitchen Reglan [Metoclopramide]     Patient doesn't recall "but it was awful"  . Sulfonamide Derivatives Hives    Chief Complaint  Patient presents with  . Medical Management of Chronic Issues    Follow up    HPI:  She is  Long term resident of this facility being seen for the management of her chronic illnesses. Overall her status is stable. She does complain of an occasional cough. There are no reports of fevers present. She denies any shortness of breath or chest pain. There are no nursing concerns at this time.     Past Medical History:  Diagnosis Date  . Abnormality of gait   . Anxiety state, unspecified   . Candidiasis of other urogenital sites   . Chronic airway obstruction, not elsewhere classified   . Depressive disorder, not elsewhere classified   . Esophageal reflux   . Lumbago   . Osteoporosis   . Spinal stenosis     Past Surgical History:  Procedure Laterality Date  . ABDOMINAL HYSTERECTOMY    . APPENDECTOMY    . CHOLECYSTECTOMY    . VERTEBROPLASTY      Social History   Social History  . Marital status: Divorced    Spouse name: N/A  . Number of children: N/A  . Years of education: N/A   Occupational History  . Not on file.   Social History Main Topics  . Smoking status: Former Research scientist (life sciences)  . Smokeless tobacco: Not on file  . Alcohol use No  . Drug  use: No  . Sexual activity: No   Other Topics Concern  . Not on file   Social History Narrative  . No narrative on file   History reviewed. No pertinent family history.    VITAL SIGNS BP 130/70   Pulse 72   Temp 97.2 F (36.2 C) (Oral)   Resp 18   Ht 5\' 2"  (1.575 m)   Wt 202 lb (91.6 kg)   SpO2 95%   BMI 36.95 kg/m   Patient's Medications  New Prescriptions   No medications on file  Previous Medications   ACETAMINOPHEN (TYLENOL) 325 MG TABLET    Take 325 mg by mouth every 4 (four) hours as needed (pain).    ASPIRIN EC 81 MG TABLET    Take 1 tablet (81 mg total) by mouth daily.   CHOLECALCIFEROL 10000 UNITS TABS    Take 2 tablets by mouth daily.   DULOXETINE (CYMBALTA) 60 MG CAPSULE    Take 60 mg by mouth daily. For depression   FLUTICASONE-SALMETEROL (ADVAIR) 250-50 MCG/DOSE AEPB    Inhale 1 puff into the lungs 2 (two) times daily. For COPD   GABAPENTIN (NEURONTIN) 100 MG CAPSULE    Take 1 capsule (100 mg total)  by mouth 3 (three) times daily.   GEMFIBROZIL (LOPID) 600 MG TABLET    Take 600 mg by mouth 2 (two) times daily before a meal.    LIDOCAINE (LIDODERM) 5 %    Apply two patches to lower back in evening for pain. Remove & Discard patch after 12 hours.   LISINOPRIL (PRINIVIL,ZESTRIL) 5 MG TABLET    Take 0.5 tablets (2.5 mg total) by mouth daily.   MEMANTINE HCL-DONEPEZIL HCL (NAMZARIC) 28-10 MG CP24    Take 1 capsule by mouth every evening. For dementia   POTASSIUM CHLORIDE SA (K-DUR,KLOR-CON) 20 MEQ TABLET    Take 40 mEq by mouth daily. Muscle weakness   PRAVASTATIN (PRAVACHOL) 40 MG TABLET    Take 40 mg by mouth every evening.    RANITIDINE (ZANTAC) 150 MG TABLET    Take 150 mg by mouth 2 (two) times daily. In the morning and at bedtime For reflux   SKIN PROTECTANTS, MISC. (CALAZIME SKIN PROTECTANT EX)    Apply to buttocks every day and night shift for skin integrity   TRAZODONE (DESYREL) 25 MG TABS TABLET    Take 25 mg by mouth at bedtime.   Modified Medications    No medications on file  Discontinued Medications   No medications on file     SIGNIFICANT DIAGNOSTIC EXAMS  LABS REVIEWED:    11-08-15:wbc 10.6; hgb 12.2; hct 40.7; mcv 89.;4 plt 377; glucose 205; bun 15.3; creat 0.56; k+ 4.0; na++135; liver normal albumin 4.0; hgb a1c 7.7  chol 154; ldl 76; trig 107; hdl 57 05-21-16: wbc 9.4; hgb 13.0; hct 39.9 ;mcv 86.5; plt 355; glucose 200; bun 13.0; creat 0.61; k+ 4.4 ;na++ 138; liver normal albumin 4.3; hgb a1c 9.0 chol 158; ldl 90; trig 81; hdl 52     Review of Systems Constitutional: Negative for appetite change and fatigue.  HENT: Negative for congestion.   Respiratory: Negative for chest tightness and shortness of breath.  has occasional cough Cardiovascular: Negative for chest pain, palpitations and leg swelling.  Gastrointestinal: Negative for nausea, abdominal pain, diarrhea and constipation.  Musculoskeletal: Negative for myalgias and arthralgias.  Skin: Negative for pallor.  Neurological: Negative for dizziness.  Psychiatric/Behavioral: The patient is not nervous/anxious.       Physical Exam Constitutional: She is oriented to person, place, and time. No distress.  Overweight   Eyes: Conjunctivae are normal.  Neck: Neck supple. No JVD present. No thyromegaly present.  Cardiovascular: Normal rate, regular rhythm and intact distal pulses.   Respiratory: Effort normal and breath sounds normal. No respiratory distress. Has few scattered wheeezes.  GI: Soft. Bowel sounds are normal. She exhibits no distension. There is no tenderness.  Musculoskeletal: She exhibits no edema.  Able to move all extremities   Lymphadenopathy:    She has no cervical adenopathy.  Neurological: She is alert and oriented to person, place, and time.  Skin: Skin is warm and dry. She is not diaphoretic.  Psychiatric: She has a normal mood and affect.     ASSESSMENT/PLAN  1. COPD: she is stable will continue advair 250/50 twice daily; will begin mucinex  1200 mg  twice daily   2. Dyslipidemia: will continue pravachol  40 mg daily will continue lopid 600 mg twice daily ldl is 90; trig 52   3. Hypokalemia: will continue k+ 40 meq daily  4. Vascular dementia: without change in her status; will continue namzaric 28-10 mg daily and will monitor her weight is 202 pounds.   5.  Gerd: will continue zantac 150 mg twice daily   6. Chronic low back pain: she is presently stable; will continue lidoderm 2 patches to lower back daily; neurontin 100 mg three times daily; cymbalta 60 mg daily  will monitor her status.   7. Anxiety: she is stable and is benefiting from cymbalta 60 mg daily will not make changes will continue trazodone 25 mg nightly for sleep.   8. Diabetes: her hgb a1c is 9.0.  Will begin tradjenta 5  mg daily   will continue lisinopril 2.5 mg daily and asa 81 mg daily        Ok Edwards NP [facility]  Contact [phone] Monday through Friday 8am- 5pm  After hours call [phone]

## 2016-06-25 DIAGNOSIS — D649 Anemia, unspecified: Secondary | ICD-10-CM | POA: Diagnosis not present

## 2016-06-25 DIAGNOSIS — E0840 Diabetes mellitus due to underlying condition with diabetic neuropathy, unspecified: Secondary | ICD-10-CM | POA: Diagnosis not present

## 2016-06-25 DIAGNOSIS — F329 Major depressive disorder, single episode, unspecified: Secondary | ICD-10-CM | POA: Diagnosis not present

## 2016-06-25 DIAGNOSIS — E11628 Type 2 diabetes mellitus with other skin complications: Secondary | ICD-10-CM | POA: Diagnosis not present

## 2016-06-25 DIAGNOSIS — F0390 Unspecified dementia without behavioral disturbance: Secondary | ICD-10-CM | POA: Diagnosis not present

## 2016-06-25 DIAGNOSIS — G4700 Insomnia, unspecified: Secondary | ICD-10-CM | POA: Diagnosis not present

## 2016-06-25 DIAGNOSIS — E11618 Type 2 diabetes mellitus with other diabetic arthropathy: Secondary | ICD-10-CM | POA: Diagnosis not present

## 2016-06-25 LAB — MICROALBUMIN, URINE: MICROALB UR: 1.2

## 2016-06-27 DIAGNOSIS — Z23 Encounter for immunization: Secondary | ICD-10-CM | POA: Diagnosis not present

## 2016-07-08 ENCOUNTER — Encounter: Payer: Self-pay | Admitting: Adult Health

## 2016-07-08 ENCOUNTER — Non-Acute Institutional Stay (SKILLED_NURSING_FACILITY): Payer: Medicare Other | Admitting: Adult Health

## 2016-07-08 DIAGNOSIS — E1139 Type 2 diabetes mellitus with other diabetic ophthalmic complication: Secondary | ICD-10-CM | POA: Diagnosis not present

## 2016-07-08 DIAGNOSIS — E1165 Type 2 diabetes mellitus with hyperglycemia: Secondary | ICD-10-CM

## 2016-07-08 NOTE — Progress Notes (Signed)
Patient ID: [name] [name], female   DOB: [birth year], 75 y.o.   MRN: [id number]   Location:   [place] Room Number: 232-B Place of Service:  SNF (31)   CODE STATUS: Full Code  Allergies  Allergen Reactions  . Avelox [Moxifloxacin Hcl In Nacl] Hives  . Codeine     Reports her throat swelled in the past but has tolerated since.  . Compazine [Prochlorperazine Edisylate] Nausea And Vomiting  . Morphine And Related     Patient doesn't recall  . Mycobacterium   . Penicillins     Patient doesn't recall.  . Phenothiazines     Patient doesn't recall  . Prednisone     Insomnia, confusion.  Marland Kitchen Reglan [Metoclopramide]     Patient doesn't recall "but it was awful"  . Sulfonamide Derivatives Hives    Chief Complaint  Patient presents with  . Acute Visit    Diabetes    HPI:  Her hgb a1c is 9.0. Her cbg readings are elevated. She is presently taking tradjenta daily for her diabetes. She will require medication change. I did discuss with her the use of lantus and she is in agreement with this plan.    Past Medical History:  Diagnosis Date  . Abnormality of gait   . Anxiety state, unspecified   . Candidiasis of other urogenital sites   . Chronic airway obstruction, not elsewhere classified   . Depressive disorder, not elsewhere classified   . Esophageal reflux   . Lumbago   . Osteoporosis   . Spinal stenosis     Past Surgical History:  Procedure Laterality Date  . ABDOMINAL HYSTERECTOMY    . APPENDECTOMY    . CHOLECYSTECTOMY    . VERTEBROPLASTY      Social History   Social History  . Marital status: Divorced    Spouse name: N/A  . Number of children: N/A  . Years of education: N/A   Occupational History  . Not on file.   Social History Main Topics  . Smoking status: Former Research scientist (life sciences)  . Smokeless tobacco: Not on file  . Alcohol use No  . Drug use: No  . Sexual activity: No   Other Topics Concern  . Not on file   Social History Narrative  . No  narrative on file   History reviewed. No pertinent family history.    VITAL SIGNS BP (!) 112/54   Pulse 98   Temp 98.4 F (36.9 C) (Oral)   Resp 18   Ht 5\' 2"  (1.575 m)   Wt 202 lb (91.6 kg)   SpO2 97%   BMI 36.95 kg/m   Patient's Medications  New Prescriptions   No medications on file  Previous Medications   ACETAMINOPHEN (TYLENOL) 325 MG TABLET    Take 325 mg by mouth every 4 (four) hours as needed (pain).    ASPIRIN EC 81 MG TABLET    Take 1 tablet (81 mg total) by mouth daily.   CHOLECALCIFEROL 10000 UNITS TABS    Take 2 tablets by mouth daily.   DULOXETINE (CYMBALTA) 60 MG CAPSULE    Take 60 mg by mouth daily. For depression   FLUTICASONE-SALMETEROL (ADVAIR) 250-50 MCG/DOSE AEPB    Inhale 1 puff into the lungs 2 (two) times daily. For COPD   GABAPENTIN (NEURONTIN) 100 MG CAPSULE    Take 1 capsule (100 mg total) by mouth 3 (three) times daily.   GEMFIBROZIL (LOPID) 600 MG TABLET    Take 600  mg by mouth 2 (two) times daily before a meal.    GUAIFENESIN (MUCINEX MAXIMUM STRENGTH) 1200 MG TB12    Take by mouth 2 (two) times daily.   LIDOCAINE (LIDODERM) 5 %    Apply two patches to lower back in evening for pain. Remove & Discard patch after 12 hours.   LINAGLIPTIN (TRADJENTA) 5 MG TABS TABLET    Take 5 mg by mouth daily.   LISINOPRIL (PRINIVIL,ZESTRIL) 5 MG TABLET    Take 0.5 tablets (2.5 mg total) by mouth daily.   MEMANTINE HCL-DONEPEZIL HCL (NAMZARIC) 28-10 MG CP24    Take 1 capsule by mouth every evening. For dementia   POTASSIUM CHLORIDE SA (K-DUR,KLOR-CON) 20 MEQ TABLET    Take 40 mEq by mouth daily. Muscle weakness   PRAVASTATIN (PRAVACHOL) 40 MG TABLET    Take 40 mg by mouth every evening.    RANITIDINE (ZANTAC) 150 MG TABLET    Take 150 mg by mouth 2 (two) times daily. In the morning and at bedtime For reflux   SKIN PROTECTANTS, MISC. (CALAZIME SKIN PROTECTANT EX)    Apply to buttocks every day and night shift for skin integrity   TRAZODONE (DESYREL) 25 MG TABS TABLET     Take 25 mg by mouth at bedtime.   Modified Medications   No medications on file  Discontinued Medications   No medications on file     SIGNIFICANT DIAGNOSTIC EXAMS  LABS REVIEWED:    11-08-15:wbc 10.6; hgb 12.2; hct 40.7; mcv 89.;4 plt 377; glucose 205; bun 15.3; creat 0.56; k+ 4.0; na++135; liver normal albumin 4.0; hgb a1c 7.7  chol 154; ldl 76; trig 107; hdl 57 05-21-16: wbc 9.4; hgb 13.0; hct 39.9 ;mcv 86.5; plt 355; glucose 200; bun 13.0; creat 0.61; k+ 4.4 ;na++ 138; liver normal albumin 4.3; hgb a1c 9.0 chol 158; ldl 90; trig 81; hdl 52     Review of Systems Constitutional: Negative for appetite change and fatigue.  HENT: Negative for congestion.   Respiratory: Negative for chest tightness and shortness of breath.   Cardiovascular: Negative for chest pain, palpitations and leg swelling.  Gastrointestinal: Negative for nausea, abdominal pain, diarrhea and constipation.  Musculoskeletal: Negative for myalgias and arthralgias.  Skin: Negative for pallor.  Neurological: Negative for dizziness.  Psychiatric/Behavioral: The patient is not nervous/anxious.       Physical Exam Constitutional: She is oriented to person, place, and time. No distress.  Overweight   Eyes: Conjunctivae are normal.  Neck: Neck supple. No JVD present. No thyromegaly present.  Cardiovascular: Normal rate, regular rhythm and intact distal pulses.   Respiratory: Effort normal and breath sounds normal. No respiratory distress. Lungs clear GI: Soft. Bowel sounds are normal. She exhibits no distension. There is no tenderness.  Musculoskeletal: She exhibits no edema.  Able to move all extremities   Lymphadenopathy:    She has no cervical adenopathy.  Neurological: She is alert and oriented to person, place, and time.  Skin: Skin is warm and dry. She is not diaphoretic.  Psychiatric: She has a normal mood and affect.     ASSESSMENT/PLAN  1. Diabetes: her hgb a1c is 9.0.   Will begin lantus 10  units nightly and will check cbg daily  Will continue  tradjenta 5  mg daily   will continue lisinopril 2.5 mg daily and asa 81 mg daily               MD is aware of resident's narcotic use and is  in agreement with current plan of care. We will attempt to wean resident as apropriate   [name] Edwards NP [facility]  Contact [phone] Monday through Friday 8am- 5pm  After hours call [phone]

## 2016-07-24 ENCOUNTER — Non-Acute Institutional Stay (SKILLED_NURSING_FACILITY): Payer: Medicare Other | Admitting: Adult Health

## 2016-07-24 ENCOUNTER — Encounter: Payer: Self-pay | Admitting: Adult Health

## 2016-07-24 DIAGNOSIS — E876 Hypokalemia: Secondary | ICD-10-CM | POA: Diagnosis not present

## 2016-07-24 DIAGNOSIS — E1165 Type 2 diabetes mellitus with hyperglycemia: Secondary | ICD-10-CM | POA: Diagnosis not present

## 2016-07-24 DIAGNOSIS — J441 Chronic obstructive pulmonary disease with (acute) exacerbation: Secondary | ICD-10-CM | POA: Diagnosis not present

## 2016-07-24 DIAGNOSIS — M545 Low back pain, unspecified: Secondary | ICD-10-CM

## 2016-07-24 DIAGNOSIS — E1169 Type 2 diabetes mellitus with other specified complication: Secondary | ICD-10-CM | POA: Diagnosis not present

## 2016-07-24 DIAGNOSIS — F0151 Vascular dementia with behavioral disturbance: Secondary | ICD-10-CM | POA: Diagnosis not present

## 2016-07-24 DIAGNOSIS — K219 Gastro-esophageal reflux disease without esophagitis: Secondary | ICD-10-CM | POA: Diagnosis not present

## 2016-07-24 DIAGNOSIS — E785 Hyperlipidemia, unspecified: Secondary | ICD-10-CM

## 2016-07-24 DIAGNOSIS — F0152 Vascular dementia, unspecified severity, with psychotic disturbance: Secondary | ICD-10-CM

## 2016-07-24 DIAGNOSIS — F22 Delusional disorders: Secondary | ICD-10-CM

## 2016-07-24 DIAGNOSIS — F0150 Vascular dementia without behavioral disturbance: Secondary | ICD-10-CM

## 2016-07-24 DIAGNOSIS — G8929 Other chronic pain: Secondary | ICD-10-CM

## 2016-07-24 DIAGNOSIS — E1139 Type 2 diabetes mellitus with other diabetic ophthalmic complication: Secondary | ICD-10-CM | POA: Diagnosis not present

## 2016-07-24 NOTE — Progress Notes (Signed)
Patient ID: [name] [name], female   DOB: August 27, 1940, 75 y.o.   MRN: [id number]    Location:   [facility] Room Number: 232-B Place of Service:  SNF (31)   CODE STATUS: Full Code  Allergies  Allergen Reactions  . Avelox [Moxifloxacin Hcl In Nacl] Hives  . Codeine     Reports her throat swelled in the past but has tolerated since.  . Compazine [Prochlorperazine Edisylate] Nausea And Vomiting  . Morphine And Related     Patient doesn't recall  . Mycobacterium   . Penicillins     Patient doesn't recall.  . Phenothiazines     Patient doesn't recall  . Prednisone     Insomnia, confusion.  Marland Kitchen Reglan [Metoclopramide]     Patient doesn't recall "but it was awful"  . Sulfonamide Derivatives Hives    Chief Complaint  Patient presents with  . Medical Management of Chronic Issues    Follow up    HPI:  She is a long term resident of this facility being seen for the management of her chronic illnesses. She is having increased wheezing present and she tells me that she is more short of breath. She denies any cough or fever.    Past Medical History:  Diagnosis Date  . Abnormality of gait   . Anxiety state, unspecified   . Candidiasis of other urogenital sites   . Chronic airway obstruction, not elsewhere classified   . Depressive disorder, not elsewhere classified   . Esophageal reflux   . Lumbago   . Osteoporosis   . Spinal stenosis     Past Surgical History:  Procedure Laterality Date  . ABDOMINAL HYSTERECTOMY    . APPENDECTOMY    . CHOLECYSTECTOMY    . VERTEBROPLASTY      Social History   Social History  . Marital status: Divorced    Spouse name: N/A  . Number of children: N/A  . Years of education: N/A   Occupational History  . Not on file.   Social History Main Topics  . Smoking status: Former Research scientist (life sciences)  . Smokeless tobacco: Not on file  . Alcohol use No  . Drug use: No  . Sexual activity: No   Other Topics Concern  . Not on file   Social  History Narrative  . No narrative on file   History reviewed. No pertinent family history.    VITAL SIGNS BP (!) 98/48   Pulse 98   Temp 98.4 F (36.9 C) (Oral)   Resp 18   Ht 5\' 2"  (1.575 m)   Wt 202 lb (91.6 kg)   SpO2 98%   BMI 36.95 kg/m   Patient's Medications  New Prescriptions   No medications on file  Previous Medications   ACETAMINOPHEN (TYLENOL) 325 MG TABLET    Take 325 mg by mouth every 4 (four) hours as needed (pain).    ASPIRIN EC 81 MG TABLET    Take 1 tablet (81 mg total) by mouth daily.   CHOLECALCIFEROL 10000 UNITS TABS    Take 2 tablets by mouth daily.   DULOXETINE (CYMBALTA) 60 MG CAPSULE    Take 60 mg by mouth daily. For depression   FLUTICASONE-SALMETEROL (ADVAIR) 250-50 MCG/DOSE AEPB    Inhale 1 puff into the lungs 2 (two) times daily. For COPD   GABAPENTIN (NEURONTIN) 100 MG CAPSULE    Take 1 capsule (100 mg total) by mouth 3 (three) times daily.   GEMFIBROZIL (LOPID) 600 MG TABLET  Take 600 mg by mouth 2 (two) times daily before a meal.    GUAIFENESIN (MUCINEX MAXIMUM STRENGTH) 1200 MG TB12    Take by mouth 2 (two) times daily.   INSULIN GLARGINE (LANTUS) 100 UNIT/ML INJECTION    Inject 10 Units into the skin at bedtime.   LIDOCAINE (LIDODERM) 5 %    Apply two patches to lower back in evening for pain. Remove & Discard patch after 12 hours.   LINAGLIPTIN (TRADJENTA) 5 MG TABS TABLET    Take 5 mg by mouth daily.   LISINOPRIL (PRINIVIL,ZESTRIL) 2.5 MG TABLET    Take 2.5 mg by mouth daily.   LISINOPRIL (PRINIVIL,ZESTRIL) 5 MG TABLET    Take 0.5 tablets (2.5 mg total) by mouth daily.   MEMANTINE HCL-DONEPEZIL HCL (NAMZARIC) 28-10 MG CP24    Take 1 capsule by mouth every evening. For dementia   POTASSIUM CHLORIDE SA (K-DUR,KLOR-CON) 20 MEQ TABLET    Take 40 mEq by mouth daily. Muscle weakness   PRAVASTATIN (PRAVACHOL) 40 MG TABLET    Take 40 mg by mouth every evening.    RANITIDINE (ZANTAC) 150 MG TABLET    Take 150 mg by mouth 2 (two) times daily. In the  morning and at bedtime For reflux   SKIN PROTECTANTS, MISC. (CALAZIME SKIN PROTECTANT EX)    Apply to buttocks every day and night shift for skin integrity   TRAZODONE (DESYREL) 25 MG TABS TABLET    Take 25 mg by mouth at bedtime.   Modified Medications   No medications on file  Discontinued Medications   No medications on file     SIGNIFICANT DIAGNOSTIC EXAMS  LABS REVIEWED:    11-08-15:wbc 10.6; hgb 12.2; hct 40.7; mcv 89.;4 plt 377; glucose 205; bun 15.3; creat 0.56; k+ 4.0; na++135; liver normal albumin 4.0; hgb a1c 7.7  chol 154; ldl 76; trig 107; hdl 57 05-21-16: wbc 9.4; hgb 13.0; hct 39.9 ;mcv 86.5; plt 355; glucose 200; bun 13.0; creat 0.61; k+ 4.4 ;na++ 138; liver normal albumin 4.3; hgb a1c 9.0 chol 158; ldl 90; trig 81; hdl 52  06-25-16: urine micro-albumin: 1.2     Review of Systems Constitutional: Negative for appetite change and fatigue.  HENT: Negative for congestion.   Respiratory: has wheezing and shortness of breath  Cardiovascular: Negative for chest pain, palpitations and leg swelling.  Gastrointestinal: Negative for nausea, abdominal pain, diarrhea and constipation.  Musculoskeletal: Negative for myalgias and arthralgias.  Skin: Negative for pallor.  Neurological: Negative for dizziness.  Psychiatric/Behavioral: The patient is not nervous/anxious.       Physical Exam Constitutional: She is oriented to person, place, and time. No distress.  Overweight   Eyes: Conjunctivae are normal.  Neck: Neck supple. No JVD present. No thyromegaly present.  Cardiovascular: Normal rate, regular rhythm and intact distal pulses.   Respiratory: increased effort present has wheezing and diminished breath sounds.  GI: Soft. Bowel sounds are normal. She exhibits no distension. There is no tenderness.  Musculoskeletal: She exhibits no edema.  Able to move all extremities   Lymphadenopathy:    She has no cervical adenopathy.  Neurological: She is alert and oriented to  person, place, and time.  Skin: Skin is warm and dry. She is not diaphoretic.  Psychiatric: She has a normal mood and affect.     ASSESSMENT/PLAN   1. COPD:  will continue advair 250/50 twice daily;mucinex 1200 mg  twice daily  Will begin spiriva 18 mcg daily and will begin duoneb every  6 hours for 3 days then every 6 hours as needed; will begin ceftin 500 mg twice daily for 10 days with florastor   2. Dyslipidemia: will continue pravachol  40 mg daily will continue lopid 600 mg twice daily ldl is 90; trig 52   3. Hypokalemia: will continue k+ 40 meq daily  4. Vascular dementia: without change in her status; will continue namzaric 28-10 mg daily and will monitor her weight is 202 pounds.   5. Gerd: will continue zantac 150 mg twice daily   6. Chronic low back pain: she is presently stable; will continue lidoderm 2 patches to lower back daily; neurontin 100 mg three times daily; cymbalta 60 mg daily  will monitor her status.   7. Anxiety: she is stable and is benefiting from cymbalta 60 mg daily will not make changes will continue trazodone 25 mg nightly for sleep.   8. Diabetes: her hgb a1c is 9.0.  Will continue  tradjenta 5  mg daily will begin lantus 10 units nightly   will continue lisinopril 2.5 mg daily and asa 81 mg daily       MD is aware of resident's narcotic use and is in agreement with current plan of care. We will attempt to wean resident as appropriate.    Ok Edwards NP [facility]  Contact [phone] Monday through Friday 8am- 5pm  After hours call [phone]

## 2016-08-11 ENCOUNTER — Encounter: Payer: Self-pay | Admitting: Internal Medicine

## 2016-08-11 ENCOUNTER — Non-Acute Institutional Stay (SKILLED_NURSING_FACILITY): Payer: Medicare Other | Admitting: Internal Medicine

## 2016-08-11 DIAGNOSIS — L89819 Pressure ulcer of head, unspecified stage: Secondary | ICD-10-CM | POA: Diagnosis not present

## 2016-08-11 DIAGNOSIS — F0152 Vascular dementia, unspecified severity, with psychotic disturbance: Secondary | ICD-10-CM

## 2016-08-11 DIAGNOSIS — E1165 Type 2 diabetes mellitus with hyperglycemia: Secondary | ICD-10-CM | POA: Diagnosis not present

## 2016-08-11 DIAGNOSIS — F0151 Vascular dementia with behavioral disturbance: Secondary | ICD-10-CM

## 2016-08-11 DIAGNOSIS — F0150 Vascular dementia without behavioral disturbance: Secondary | ICD-10-CM

## 2016-08-11 DIAGNOSIS — E1139 Type 2 diabetes mellitus with other diabetic ophthalmic complication: Secondary | ICD-10-CM

## 2016-08-11 DIAGNOSIS — F22 Delusional disorders: Secondary | ICD-10-CM | POA: Diagnosis not present

## 2016-08-11 NOTE — Progress Notes (Signed)
Patient ID: [name] [name], female   DOB: Feb 26, 1941, 76 y.o.   MRN: [id number]    DATE: 08/11/2016  Location:    [place] [place] Room Number: [id number] Place of Service: SNF (31)   Extended Emergency Contact Information Primary Emergency Contact: [name]) Address: [address]          [name], [place] Phone: [phone] Relation: Son Secondary Emergency Contact: Charletta Cousin [place] Phone: [phone] Work Phone: [phone] Relation: Son  Advanced Directive information Does Patient Have a Medical Advance Directive?: Yes, Would patient like information on creating a medical advance directive?: No - Patient declined, Type of Advance Directive: Out of facility DNR (pink MOST or yellow form), Pre-existing out of facility DNR order (yellow form or pink MOST form): Pink MOST form placed in chart (order not valid for inpatient use)  Chief Complaint  Patient presents with  . Acute Visit    rash on head    HPI:  76 yo female long term resident seen today for head pain and redness of forehead x several days. No known trauma. Nursing reports she likes to lie in a fetal position on her bed often and also she sleeps with her face on her arms. No falls or other trauma. No f/c, HA or dizziness. No neck pain. no known insect bites and no recent travel hx. She is a poor historian due to dementia. Hx obtained from chart  COPD - stable on advair 250/50 twice daily; mucinex 1200 mg  twice dailyspiriva 18 mcg daily; duoneb every 6 hours prn. She completed ceftin 500 mg twice daily for 10 days for COPD exacerbation in Dec 2017   Dyslipidemia - stable on pravachol  40 mg daily; lopid 600 mg twice daily. LDL 90; TG 52   Hypokalemia - stable on k+ 40 meq daily  Vascular dementia - stable on namzaric 28-10 mg daily  GERD - stable on zantac 150 mg twice daily   Chronic low back pain - pain stable on lidoderm 2 patches to  lower back daily; neurontin 100 mg three times daily; cymbalta 60 mg daily.   Anxiety/insomnia- stable on cymbalta 60 mg daily; trazodone 25 mg nightly for sleep.   DM -  A1c 9%.  She takes tradjenta 5  mg daily and  lantus 10 units nightly   She is on lisinopril 2.5 mg daily and asa 81 mg daily  Past Medical History:  Diagnosis Date  . Abnormality of gait   . Anxiety state, unspecified   . Candidiasis of other urogenital sites   . Chronic airway obstruction, not elsewhere classified   . Depressive disorder, not elsewhere classified   . Esophageal reflux   . Lumbago   . Osteoporosis   . Spinal stenosis     Past Surgical History:  Procedure Laterality Date  . ABDOMINAL HYSTERECTOMY    . APPENDECTOMY    . CHOLECYSTECTOMY    . VERTEBROPLASTY      Patient Care Team: Gildardo Cranker, DO as PCP - General (Internal Medicine) Gerlene Fee, NP as Nurse Practitioner ([place]) [facility] ([place])  Social History   Social History  . Marital status: Divorced    Spouse name: N/A  . Number of children: N/A  . Years of education: N/A   Occupational History  . Not on file.   Social History Main Topics  . Smoking status: Former Research scientist (life sciences)  . Smokeless tobacco: Not  on file  . Alcohol use No  . Drug use: No  . Sexual activity: No   Other Topics Concern  . Not on file   Social History Narrative  . No narrative on file     reports that she has quit smoking. She does not have any smokeless tobacco history on file. She reports that she does not drink alcohol or use drugs.  History reviewed. No pertinent family history. No family status information on file.    Immunization History  Administered Date(s) Administered  . Influenza Whole 05/22/2013  . Influenza-Unspecified 05/23/2014, 05/20/2015, 06/27/2016  . Pneumococcal-Unspecified 08/31/2011    Allergies  Allergen Reactions  . Avelox [Moxifloxacin Hcl In Nacl] Hives  . Codeine       Reports her throat swelled in the past but has tolerated since.  . Compazine [Prochlorperazine Edisylate] Nausea And Vomiting  . Morphine And Related     Patient doesn't recall  . Mycobacterium   . Penicillins     Patient doesn't recall.  . Phenothiazines     Patient doesn't recall  . Prednisone     Insomnia, confusion.  Marland Kitchen Reglan [Metoclopramide]     Patient doesn't recall "but it was awful"  . Sulfonamide Derivatives Hives    Medications: Patient's Medications  New Prescriptions   No medications on file  Previous Medications   ACETAMINOPHEN (TYLENOL) 325 MG TABLET    Take 325 mg by mouth every 4 (four) hours as needed (pain).    ASPIRIN EC 81 MG TABLET    Take 1 tablet (81 mg total) by mouth daily.   CHOLECALCIFEROL 10000 UNITS TABS    Take 2 tablets by mouth daily.   DULOXETINE (CYMBALTA) 60 MG CAPSULE    Take 60 mg by mouth daily. For depression   FLUTICASONE-SALMETEROL (ADVAIR) 250-50 MCG/DOSE AEPB    Inhale 1 puff into the lungs 2 (two) times daily. For COPD   GABAPENTIN (NEURONTIN) 100 MG CAPSULE    Take 1 capsule (100 mg total) by mouth 3 (three) times daily.   GEMFIBROZIL (LOPID) 600 MG TABLET    Take 600 mg by mouth 2 (two) times daily before a meal.    GUAIFENESIN (MUCINEX MAXIMUM STRENGTH) 1200 MG TB12    Take by mouth 2 (two) times daily.   INSULIN GLARGINE (LANTUS) 100 UNIT/ML INJECTION    Inject 10 Units into the skin at bedtime.   LIDOCAINE (LIDODERM) 5 %    Apply two patches to lower back in evening for pain. Remove & Discard patch after 12 hours.   LINAGLIPTIN (TRADJENTA) 5 MG TABS TABLET    Take 5 mg by mouth daily.   LISINOPRIL (PRINIVIL,ZESTRIL) 2.5 MG TABLET    Take 2.5 mg by mouth daily.   MEMANTINE HCL-DONEPEZIL HCL (NAMZARIC) 28-10 MG CP24    Take 1 capsule by mouth every evening. For dementia   POTASSIUM CHLORIDE SA (K-DUR,KLOR-CON) 20 MEQ TABLET    Take 40 mEq by mouth daily. Muscle weakness   PRAVASTATIN (PRAVACHOL) 40 MG TABLET    Take 40 mg by mouth  every evening.    RANITIDINE (ZANTAC) 150 MG TABLET    Take 150 mg by mouth 2 (two) times daily. In the morning and at bedtime For reflux   SACCHAROMYCES BOULARDII (FLORASTOR) 250 MG CAPSULE    Take 250 mg by mouth 2 (two) times daily.   SKIN PROTECTANTS, MISC. (CALAZIME SKIN PROTECTANT EX)    Apply to buttocks every day and night shift for skin  integrity   TIOTROPIUM (SPIRIVA) 18 MCG INHALATION CAPSULE    Place 18 mcg into inhaler and inhale daily.   TRAZODONE (DESYREL) 25 MG TABS TABLET    Take 25 mg by mouth at bedtime.   Modified Medications   No medications on file  Discontinued Medications   LISINOPRIL (PRINIVIL,ZESTRIL) 5 MG TABLET    Take 0.5 tablets (2.5 mg total) by mouth daily.    Review of Systems  Unable to perform ROS: Dementia    Vitals:   08/11/16 1413  BP: (!) 107/94  Pulse: 90  Resp: 17  Temp: 97.6 F (36.4 C)  TempSrc: Oral  Weight: 193 lb 6.4 oz (87.7 kg)   Body mass index is 35.37 kg/m.  Physical Exam  Constitutional:  Lying in bed in NAD, frail appearing  HENT:  Head:    Musculoskeletal: She exhibits tenderness.  Frontal bone stuck down; CRI intact  Neurological: She is alert.  Skin: Skin is warm and dry. No rash noted. There is erythema (blancheable, TTP with forehead redness). No pallor.  No ulceration or vesicular formation  Psychiatric: She has a normal mood and affect. Her speech is normal and behavior is normal. Cognition and memory are impaired.     Labs reviewed: Nursing Home on 07/24/2016  Component Date Value Ref Range Status  . Microalb, Ur 06/25/2016 1.2   Final  Nursing Home on 06/23/2016  Component Date Value Ref Range Status  . Hemoglobin 05/21/2016 13.0  12.0 - 16.0 g/dL Final  . HCT 05/21/2016 40  36 - 46 % Final  . Platelets 05/21/2016 355  150 - 399 K/L Final  . WBC 05/21/2016 9.4  10^3/mL Final  . Glucose 05/21/2016 200  mg/dL Final  . BUN 05/21/2016 13  4 - 21 mg/dL Final  . Creatinine 05/21/2016 0.6  0.5 - 1.1 mg/dL  Final  . Potassium 05/21/2016 4.4  3.4 - 5.3 mmol/L Final  . Sodium 05/21/2016 138  137 - 147 mmol/L Final  . Triglycerides 05/21/2016 81  40 - 160 mg/dL Final  . Cholesterol 05/21/2016 158  0 - 200 mg/dL Final  . HDL 05/21/2016 52  35 - 70 mg/dL Final  . LDL Cholesterol 05/21/2016 90  mg/dL Final  . Alkaline Phosphatase 05/21/2016 105  25 - 125 U/L Final  . ALT 05/21/2016 9  7 - 35 U/L Final  . AST 05/21/2016 12* 13 - 35 U/L Final  . Bilirubin, Total 05/21/2016 0.2  mg/dL Final  . Hemoglobin A1C 05/21/2016 9.0   Final    No results found.   Assessment/Plan   ICD-9-CM ICD-10-CM   1. Decubitus ulcer of head, unspecified ulcer stage 707.09 L89.819    707.20     probable stage 1  2. Vascular dementia with paranoia ([place]) 290.42 F01.51     F22   3. 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 Recommend pad bed rails  Cont current meds as ordered  Will follow  Harrol Novello S. Perlie Gold  [facility] [address] [place], [place] [address] [phone] Cell (Monday-Friday 8 AM - 5 PM) [phone] After 5 PM and follow prompts

## 2016-09-15 ENCOUNTER — Non-Acute Institutional Stay (SKILLED_NURSING_FACILITY): Payer: Medicare Other | Admitting: Adult Health

## 2016-09-15 DIAGNOSIS — E1139 Type 2 diabetes mellitus with other diabetic ophthalmic complication: Secondary | ICD-10-CM | POA: Diagnosis not present

## 2016-09-15 DIAGNOSIS — E876 Hypokalemia: Secondary | ICD-10-CM | POA: Diagnosis not present

## 2016-09-15 DIAGNOSIS — E1169 Type 2 diabetes mellitus with other specified complication: Secondary | ICD-10-CM

## 2016-09-15 DIAGNOSIS — F0151 Vascular dementia with behavioral disturbance: Secondary | ICD-10-CM

## 2016-09-15 DIAGNOSIS — F22 Delusional disorders: Secondary | ICD-10-CM

## 2016-09-15 DIAGNOSIS — F339 Major depressive disorder, recurrent, unspecified: Secondary | ICD-10-CM | POA: Diagnosis not present

## 2016-09-15 DIAGNOSIS — E1165 Type 2 diabetes mellitus with hyperglycemia: Secondary | ICD-10-CM | POA: Diagnosis not present

## 2016-09-15 DIAGNOSIS — F0150 Vascular dementia without behavioral disturbance: Secondary | ICD-10-CM

## 2016-09-15 DIAGNOSIS — M545 Low back pain, unspecified: Secondary | ICD-10-CM

## 2016-09-15 DIAGNOSIS — J441 Chronic obstructive pulmonary disease with (acute) exacerbation: Secondary | ICD-10-CM | POA: Diagnosis not present

## 2016-09-15 DIAGNOSIS — G8929 Other chronic pain: Secondary | ICD-10-CM

## 2016-09-15 DIAGNOSIS — E785 Hyperlipidemia, unspecified: Secondary | ICD-10-CM | POA: Diagnosis not present

## 2016-09-15 DIAGNOSIS — K219 Gastro-esophageal reflux disease without esophagitis: Secondary | ICD-10-CM

## 2016-09-15 DIAGNOSIS — F0152 Vascular dementia, unspecified severity, with psychotic disturbance: Secondary | ICD-10-CM

## 2016-09-16 DIAGNOSIS — E0840 Diabetes mellitus due to underlying condition with diabetic neuropathy, unspecified: Secondary | ICD-10-CM | POA: Diagnosis not present

## 2016-09-18 ENCOUNTER — Non-Acute Institutional Stay (SKILLED_NURSING_FACILITY): Payer: Medicare Other | Admitting: Adult Health

## 2016-09-18 DIAGNOSIS — E1165 Type 2 diabetes mellitus with hyperglycemia: Secondary | ICD-10-CM | POA: Diagnosis not present

## 2016-09-18 DIAGNOSIS — E1139 Type 2 diabetes mellitus with other diabetic ophthalmic complication: Secondary | ICD-10-CM

## 2016-09-21 DIAGNOSIS — E11628 Type 2 diabetes mellitus with other skin complications: Secondary | ICD-10-CM | POA: Diagnosis not present

## 2016-09-21 LAB — HEMOGLOBIN A1C: HEMOGLOBIN A1C: 8.6

## 2016-09-25 DIAGNOSIS — E11628 Type 2 diabetes mellitus with other skin complications: Secondary | ICD-10-CM | POA: Diagnosis not present

## 2016-10-13 ENCOUNTER — Encounter: Payer: Self-pay | Admitting: Adult Health

## 2016-10-13 NOTE — Progress Notes (Signed)
Location:   [place] Room Number: [id number] of Service:  SNF (31)   CODE STATUS: Full Code  Allergies  Allergen Reactions  . Avelox [Moxifloxacin Hcl In Nacl] Hives  . Codeine     Reports her throat swelled in the past but has tolerated since.  . Compazine [Prochlorperazine Edisylate] Nausea And Vomiting  . Morphine And Related     Patient doesn't recall  . Mycobacterium   . Penicillins     Patient doesn't recall.  . Phenothiazines     Patient doesn't recall  . Prednisone     Insomnia, confusion.  Marland Kitchen Reglan [Metoclopramide]     Patient doesn't recall "but it was awful"  . Sulfonamide Derivatives Hives    Chief Complaint  Patient presents with  . Acute Visit    Eye Redness    HPI:    Past Medical History:  Diagnosis Date  . Abnormality of gait   . Anxiety state, unspecified   . Candidiasis of other urogenital sites   . Chronic airway obstruction, not elsewhere classified   . Depressive disorder, not elsewhere classified   . Esophageal reflux   . Lumbago   . Osteoporosis   . Spinal stenosis     Past Surgical History:  Procedure Laterality Date  . ABDOMINAL HYSTERECTOMY    . APPENDECTOMY    . CHOLECYSTECTOMY    . VERTEBROPLASTY      Social History   Social History  . Marital status: Divorced    Spouse name: N/A  . Number of children: N/A  . Years of education: N/A   Occupational History  . Not on file.   Social History Main Topics  . Smoking status: Former Research scientist (life sciences)  . Smokeless tobacco: Never Used  . Alcohol use No  . Drug use: No  . Sexual activity: No   Other Topics Concern  . Not on file   Social History Narrative  . No narrative on file   History reviewed. No pertinent family history.    VITAL SIGNS BP (!) 112/58   Pulse 98   Temp 98.2 F (36.8 C)   Resp 18   Ht 5\' 2"  (1.575 m)   SpO2 97%   Patient's Medications  New Prescriptions   No medications on file  Previous Medications   ACETAMINOPHEN (TYLENOL)  325 MG TABLET    Take 325 mg by mouth every 4 (four) hours as needed (pain).    ALBUTEROL (PROVENTIL) (2.5 MG/3ML) 0.083% NEBULIZER SOLUTION    Take 2.5 mg by nebulization every 6 (six) hours as needed for wheezing or shortness of breath.   ASPIRIN EC 81 MG TABLET    Take 1 tablet (81 mg total) by mouth daily.   CHOLECALCIFEROL 10000 UNITS TABS    Take 2 tablets by mouth daily.   DULOXETINE (CYMBALTA) 60 MG CAPSULE    Take 60 mg by mouth daily. For depression   FLUTICASONE-SALMETEROL (ADVAIR) 250-50 MCG/DOSE AEPB    Inhale 1 puff into the lungs 2 (two) times daily. For COPD   GABAPENTIN (NEURONTIN) 100 MG CAPSULE    Take 1 capsule (100 mg total) by mouth 3 (three) times daily.   GEMFIBROZIL (LOPID) 600 MG TABLET    Take 600 mg by mouth 2 (two) times daily before a meal.    GUAIFENESIN (MUCINEX MAXIMUM STRENGTH) 1200 MG TB12    Take by mouth 2 (two) times daily.   INSULIN GLARGINE (LANTUS) 100 UNIT/ML INJECTION    Inject 15  Units into the skin at bedtime.    LIDOCAINE (LIDODERM) 5 %    Apply two patches to lower back in evening for pain. Remove & Discard patch after 12 hours.   LINAGLIPTIN (TRADJENTA) 5 MG TABS TABLET    Take 5 mg by mouth daily.   MEMANTINE HCL-DONEPEZIL HCL (NAMZARIC) 28-10 MG CP24    Take 1 capsule by mouth every evening. For dementia   PRAVASTATIN (PRAVACHOL) 40 MG TABLET    Take 40 mg by mouth every evening.    RANITIDINE (ZANTAC) 150 MG TABLET    Take 150 mg by mouth 2 (two) times daily. In the morning and at bedtime For reflux   SACCHAROMYCES BOULARDII (FLORASTOR) 250 MG CAPSULE    Take 250 mg by mouth 2 (two) times daily.   SKIN PROTECTANTS, MISC. (CALAZIME SKIN PROTECTANT EX)    Apply to buttocks every day and night shift for skin integrity   TIOTROPIUM (SPIRIVA) 18 MCG INHALATION CAPSULE    Place 18 mcg into inhaler and inhale daily.   TRAZODONE (DESYREL) 25 MG TABS TABLET    Take 25 mg by mouth at bedtime.   Modified Medications   No medications on file  Discontinued  Medications   LISINOPRIL (PRINIVIL,ZESTRIL) 2.5 MG TABLET    Take 2.5 mg by mouth daily.   POTASSIUM CHLORIDE SA (K-DUR,KLOR-CON) 20 MEQ TABLET    Take 40 mEq by mouth daily. Muscle weakness     SIGNIFICANT DIAGNOSTIC EXAMS  LABS REVIEWED:    11-08-15:wbc 10.6; hgb 12.2; hct 40.7; mcv 89.;4 plt 377; glucose 205; bun 15.3; creat 0.56; k+ 4.0; na++135; liver normal albumin 4.0; hgb a1c 7.7  chol 154; ldl 76; trig 107; hdl 57 05-21-16: wbc 9.4; hgb 13.0; hct 39.9 ;mcv 86.5; plt 355; glucose 200; bun 13.0; creat 0.61; k+ 4.4 ;na++ 138; liver normal albumin 4.3; hgb a1c 9.0 chol 158; ldl 90; trig 81; hdl 52  06-25-16: urine micro-albumin: 1.2     Review of Systems Constitutional: Negative for appetite change and fatigue.  HENT: Negative for congestion.   Respiratory: has wheezing and shortness of breath  Cardiovascular: Negative for chest pain, palpitations and leg swelling.  Gastrointestinal: Negative for nausea, abdominal pain, diarrhea and constipation.  Musculoskeletal: Negative for myalgias and arthralgias.  Skin: Negative for pallor.  Neurological: Negative for dizziness.  Psychiatric/Behavioral: The patient is not nervous/anxious.       Physical Exam Constitutional: She is oriented to person, place, and time. No distress.  Overweight   Eyes: Conjunctivae are normal.  Neck: Neck supple. No JVD present. No thyromegaly present.  Cardiovascular: Normal rate, regular rhythm and intact distal pulses.   Respiratory: increased effort present has wheezing and diminished breath sounds.  GI: Soft. Bowel sounds are normal. She exhibits no distension. There is no tenderness.  Musculoskeletal: She exhibits no edema.  Able to move all extremities   Lymphadenopathy:    She has no cervical adenopathy.  Neurological: She is alert and oriented to person, place, and time.  Skin: Skin is warm and dry. She is not diaphoretic.  Psychiatric: She has a normal mood and affect.      ASSESSMENT/PLAN   1. COPD:  will continue advair 250/50 twice daily;mucinex 1200 mg  twice daily  Will begin spiriva 18 mcg daily and will begin duoneb every 6 hours for 3 days then every 6 hours as needed; will begin ceftin 500 mg twice daily for 10 days with florastor   2. Dyslipidemia: will continue pravachol  40 mg daily will continue lopid 600 mg twice daily ldl is 90; trig 52   3. Hypokalemia: will continue k+ 40 meq daily  4. Vascular dementia: without change in her status; will continue namzaric 28-10 mg daily and will monitor her weight is 202 pounds.   5. Gerd: will continue zantac 150 mg twice daily   6. Chronic low back pain: she is presently stable; will continue lidoderm 2 patches to lower back daily; neurontin 100 mg three times daily; cymbalta 60 mg daily  will monitor her status.   7. Anxiety: she is stable and is benefiting from cymbalta 60 mg daily will not make changes will continue trazodone 25 mg nightly for sleep.   8. Diabetes: her hgb a1c is 9.0.  Will continue  tradjenta 5  mg daily will begin lantus 10 units nightly   will continue lisinopril 2.5 mg daily and asa 81 mg daily      MD is aware of resident's narcotic use and is in agreement with current plan of care. We will attempt to wean resident as apropriate     Ok Edwards NP [facility]  Contact [phone] Monday through Friday 8am- 5pm  After hours call [phone]

## 2016-10-16 ENCOUNTER — Encounter: Payer: Self-pay | Admitting: Adult Health

## 2016-10-16 ENCOUNTER — Non-Acute Institutional Stay (SKILLED_NURSING_FACILITY): Payer: Medicare Other | Admitting: Adult Health

## 2016-10-16 DIAGNOSIS — G8929 Other chronic pain: Secondary | ICD-10-CM | POA: Diagnosis not present

## 2016-10-16 DIAGNOSIS — E785 Hyperlipidemia, unspecified: Secondary | ICD-10-CM

## 2016-10-16 DIAGNOSIS — J441 Chronic obstructive pulmonary disease with (acute) exacerbation: Secondary | ICD-10-CM

## 2016-10-16 DIAGNOSIS — F0152 Vascular dementia, unspecified severity, with psychotic disturbance: Secondary | ICD-10-CM

## 2016-10-16 DIAGNOSIS — E1139 Type 2 diabetes mellitus with other diabetic ophthalmic complication: Secondary | ICD-10-CM

## 2016-10-16 DIAGNOSIS — F0150 Vascular dementia without behavioral disturbance: Secondary | ICD-10-CM

## 2016-10-16 DIAGNOSIS — F22 Delusional disorders: Secondary | ICD-10-CM

## 2016-10-16 DIAGNOSIS — E1169 Type 2 diabetes mellitus with other specified complication: Secondary | ICD-10-CM

## 2016-10-16 DIAGNOSIS — E1165 Type 2 diabetes mellitus with hyperglycemia: Secondary | ICD-10-CM

## 2016-10-16 DIAGNOSIS — F0151 Vascular dementia with behavioral disturbance: Secondary | ICD-10-CM

## 2016-10-16 DIAGNOSIS — M545 Low back pain: Secondary | ICD-10-CM

## 2016-10-16 DIAGNOSIS — F339 Major depressive disorder, recurrent, unspecified: Secondary | ICD-10-CM | POA: Diagnosis not present

## 2016-10-16 NOTE — Progress Notes (Signed)
Location:   [facility] Room Number: 952 W UXLKG of Service:  SNF (31)   CODE STATUS: Full Code  Allergies  Allergen Reactions  . Avelox [Moxifloxacin Hcl In Nacl] Hives  . Codeine     Reports her throat swelled in the past but has tolerated since.  . Compazine [Prochlorperazine Edisylate] Nausea And Vomiting  . Morphine And Related     Patient doesn't recall  . Mycobacterium   . Penicillins     Patient doesn't recall.  . Phenothiazines     Patient doesn't recall  . Prednisone     Insomnia, confusion.  Marland Kitchen Reglan [Metoclopramide]     Patient doesn't recall "but it was awful"  . Sulfonamide Derivatives Hives    Chief Complaint  Patient presents with  . Medical Management of Chronic Issues    Routine Visit    HPI:  She is a long term resident of this facility being seen for the management of her chronic illnesses. Overall her status is stable. She is not voicing any complaints at this time. She tells me that she is feeling good. There are no nursing concerns at this time.    Past Medical History:  Diagnosis Date  . Abnormality of gait   . Anxiety state, unspecified   . Candidiasis of other urogenital sites   . Chronic airway obstruction, not elsewhere classified   . Depressive disorder, not elsewhere classified   . Esophageal reflux   . Lumbago   . Osteoporosis   . Spinal stenosis     Past Surgical History:  Procedure Laterality Date  . ABDOMINAL HYSTERECTOMY    . APPENDECTOMY    . CHOLECYSTECTOMY    . VERTEBROPLASTY      Social History   Social History  . Marital status: Divorced    Spouse name: N/A  . Number of children: N/A  . Years of education: N/A   Occupational History  . Not on file.   Social History Main Topics  . Smoking status: Former Research scientist (life sciences)  . Smokeless tobacco: Never Used  . Alcohol use No  . Drug use: No  . Sexual activity: No   Other Topics Concern  . Not on file   Social History Narrative  . No narrative on file    History reviewed. No pertinent family history.    VITAL SIGNS BP (!) 112/58   Pulse 98   Temp 97.7 F (36.5 C)   Resp 18   Ht 5\' 2"  (1.575 m)   Wt 188 lb (85.3 kg)   SpO2 99%   BMI 34.39 kg/m   Patient's Medications  New Prescriptions   No medications on file  Previous Medications   ACETAMINOPHEN (TYLENOL) 325 MG TABLET    Take 325 mg by mouth every 4 (four) hours as needed (pain).    ALBUTEROL (PROVENTIL) (2.5 MG/3ML) 0.083% NEBULIZER SOLUTION    Take 2.5 mg by nebulization every 6 (six) hours as needed for wheezing or shortness of breath.   ASPIRIN EC 81 MG TABLET    Take 1 tablet (81 mg total) by mouth daily.   CHOLECALCIFEROL 10000 UNITS TABS    Take 2 tablets by mouth daily.   DULOXETINE (CYMBALTA) 60 MG CAPSULE    Take 60 mg by mouth daily. For depression   FLUTICASONE-SALMETEROL (ADVAIR) 250-50 MCG/DOSE AEPB    Inhale 1 puff into the lungs 2 (two) times daily. For COPD   GABAPENTIN (NEURONTIN) 100 MG CAPSULE    Take 1 capsule (  100 mg total) by mouth 3 (three) times daily.   GEMFIBROZIL (LOPID) 600 MG TABLET    Take 600 mg by mouth 2 (two) times daily before a meal.    GUAIFENESIN (MUCINEX MAXIMUM STRENGTH) 1200 MG TB12    Take by mouth 2 (two) times daily.   INSULIN GLARGINE (LANTUS) 100 UNIT/ML INJECTION    Inject 15 Units into the skin at bedtime.    LIDOCAINE (LIDODERM) 5 %    Apply two patches to lower back in evening for pain. Remove & Discard patch after 12 hours.   LINAGLIPTIN (TRADJENTA) 5 MG TABS TABLET    Take 5 mg by mouth daily.   MEMANTINE HCL-DONEPEZIL HCL (NAMZARIC) 28-10 MG CP24    Take 1 capsule by mouth every evening. For dementia   PRAVASTATIN (PRAVACHOL) 40 MG TABLET    Take 40 mg by mouth every evening.    RANITIDINE (ZANTAC) 150 MG TABLET    Take 150 mg by mouth 2 (two) times daily. In the morning and at bedtime For reflux   SACCHAROMYCES BOULARDII (FLORASTOR) 250 MG CAPSULE    Take 250 mg by mouth 2 (two) times daily.   SKIN PROTECTANTS, MISC.  (CALAZIME SKIN PROTECTANT EX)    Apply to buttocks every day and night shift for skin integrity   TIOTROPIUM (SPIRIVA) 18 MCG INHALATION CAPSULE    Place 18 mcg into inhaler and inhale daily.   TRAZODONE (DESYREL) 25 MG TABS TABLET    Take 25 mg by mouth at bedtime.   Modified Medications   No medications on file  Discontinued Medications   No medications on file     SIGNIFICANT DIAGNOSTIC EXAMS  LABS REVIEWED:    11-08-15:wbc 10.6; hgb 12.2; hct 40.7; mcv 89.;4 plt 377; glucose 205; bun 15.3; creat 0.56; k+ 4.0; na++135; liver normal albumin 4.0; hgb a1c 7.7  chol 154; ldl 76; trig 107; hdl 57 05-21-16: wbc 9.4; hgb 13.0; hct 39.9 ;mcv 86.5; plt 355; glucose 200; bun 13.0; creat 0.61; k+ 4.4 ;na++ 138; liver normal albumin 4.3; hgb a1c 9.0 chol 158; ldl 90; trig 81; hdl 52  06-25-16: urine micro-albumin: 1.2  09-21-16: hgb a1c 8.6 09-25-16: hgb a1c 8.4    Review of Systems Constitutional: Negative for appetite change and fatigue.  HENT: Negative for congestion.   Respiratory: no cough or shortness of breath  Cardiovascular: Negative for chest pain, palpitations and leg swelling.  Gastrointestinal: Negative for nausea, abdominal pain, diarrhea and constipation.  Musculoskeletal: Negative for myalgias and arthralgias.  Skin: Negative for pallor.  Neurological: Negative for dizziness.  Psychiatric/Behavioral: The patient is not nervous/anxious.       Physical Exam Constitutional: She is oriented to person, place, and time. No distress.  Overweight   Eyes: Conjunctivae are normal.  Neck: Neck supple. No JVD present. No thyromegaly present.  Cardiovascular: Normal rate, regular rhythm and intact distal pulses.   Respiratory: lung sounds are clear no wheezing  GI: Soft. Bowel sounds are normal. She exhibits no distension. There is no tenderness.  Musculoskeletal: She exhibits no edema.  Able to move all extremities   Lymphadenopathy:    She has no cervical adenopathy.    Neurological: She is alert and oriented to person, place, and time.  Skin: Skin is warm and dry. She is not diaphoretic.  Psychiatric: She has a normal mood and affect.     ASSESSMENT/PLAN  1. COPD:  will continue advair 250/50 twice daily;mucinex 1200 mg  twice daily spiriva 18 mcg  daily   2. Dyslipidemia: will continue pravachol  40 mg daily will continue lopid 600 mg twice daily ldl is 90; trig 52   3. Hypokalemia: will continue k+ 40 meq daily  4. Vascular dementia: without change in her status; will continue namzaric 28-10 mg daily and will monitor her weight is 188 pounds.   5. Gerd: will continue zantac 150 mg twice daily   6. Chronic low back pain: she is presently stable; will continue lidoderm 2 patches to lower back daily; neurontin 100 mg three times daily; cymbalta 60 mg daily  will monitor her status.   7. Anxiety: she is stable and is benefiting from cymbalta 60 mg daily will not make changes will continue trazodone 25 mg nightly for sleep.   8. Diabetes: her hgb a1c is 8.4.  Will continue  tradjenta 5  mg daily will change to lantus 20 units nightly   will continue lisinopril 2.5 mg daily and asa 81 mg daily      Ok Edwards NP [facility]  Contact [phone] Monday through Friday 8am- 5pm  After hours call [phone]

## 2016-10-19 ENCOUNTER — Encounter: Payer: Self-pay | Admitting: Adult Health

## 2016-10-19 NOTE — Progress Notes (Signed)
Location:   [facility] of Service:  SNF (31)   CODE STATUS: full code   Allergies  Allergen Reactions  . Avelox [Moxifloxacin Hcl In Nacl] Hives  . Codeine     Reports her throat swelled in the past but has tolerated since.  . Compazine [Prochlorperazine Edisylate] Nausea And Vomiting  . Morphine And Related     Patient doesn't recall  . Mycobacterium   . Penicillins     Patient doesn't recall.  . Phenothiazines     Patient doesn't recall  . Prednisone     Insomnia, confusion.  Marland Kitchen Reglan [Metoclopramide]     Patient doesn't recall "but it was awful"  . Sulfonamide Derivatives Hives    Chief Complaint  Patient presents with  . Acute Visit    diabetes     HPI:  Her cbg's are elevated. Her lisinopril was stopped due to low readings. She is not voicing any complaints at this time. There are no nursing concerns at this time.   Past Medical History:  Diagnosis Date  . Abnormality of gait   . Anxiety state, unspecified   . Candidiasis of other urogenital sites   . Chronic airway obstruction, not elsewhere classified   . Depressive disorder, not elsewhere classified   . Esophageal reflux   . Lumbago   . Osteoporosis   . Spinal stenosis     Past Surgical History:  Procedure Laterality Date  . ABDOMINAL HYSTERECTOMY    . APPENDECTOMY    . CHOLECYSTECTOMY    . VERTEBROPLASTY      Social History   Social History  . Marital status: Divorced    Spouse name: N/A  . Number of children: N/A  . Years of education: N/A   Occupational History  . Not on file.   Social History Main Topics  . Smoking status: Former Research scientist (life sciences)  . Smokeless tobacco: Never Used  . Alcohol use No  . Drug use: No  . Sexual activity: No   Other Topics Concern  . Not on file   Social History Narrative  . No narrative on file   No family history on file.    VITAL SIGNS BP (!) 112/58   Pulse 98   Temp 98.2 F (36.8 C)   Resp 18   Ht 5\' 2"  (1.575 m)   Wt 192 lb  (87.1 kg)   SpO2 97%   BMI 35.12 kg/m   Patient's Medications  New Prescriptions   No medications on file  Previous Medications   ACETAMINOPHEN (TYLENOL) 325 MG TABLET    Take 325 mg by mouth every 4 (four) hours as needed (pain).    ALBUTEROL (PROVENTIL) (2.5 MG/3ML) 0.083% NEBULIZER SOLUTION    Take 2.5 mg by nebulization every 6 (six) hours as needed for wheezing or shortness of breath.   ASPIRIN EC 81 MG TABLET    Take 1 tablet (81 mg total) by mouth daily.   CHOLECALCIFEROL 10000 UNITS TABS    Take 2 tablets by mouth daily.   DULOXETINE (CYMBALTA) 60 MG CAPSULE    Take 60 mg by mouth daily. For depression   FLUTICASONE-SALMETEROL (ADVAIR) 250-50 MCG/DOSE AEPB    Inhale 1 puff into the lungs 2 (two) times daily. For COPD   GABAPENTIN (NEURONTIN) 100 MG CAPSULE    Take 1 capsule (100 mg total) by mouth 3 (three) times daily.   GEMFIBROZIL (LOPID) 600 MG TABLET    Take 600 mg by mouth  2 (two) times daily before a meal.    GUAIFENESIN (MUCINEX MAXIMUM STRENGTH) 1200 MG TB12    Take by mouth 2 (two) times daily.   INSULIN GLARGINE (LANTUS) 100 UNIT/ML INJECTION    Inject 10 Units into the skin at bedtime.    LIDOCAINE (LIDODERM) 5 %    Apply two patches to lower back in evening for pain. Remove & Discard patch after 12 hours.   LINAGLIPTIN (TRADJENTA) 5 MG TABS TABLET    Take 5 mg by mouth daily.   MEMANTINE HCL-DONEPEZIL HCL (NAMZARIC) 28-10 MG CP24    Take 1 capsule by mouth every evening. For dementia   PRAVASTATIN (PRAVACHOL) 40 MG TABLET    Take 40 mg by mouth every evening.    RANITIDINE (ZANTAC) 150 MG TABLET    Take 150 mg by mouth 2 (two) times daily. In the morning and at bedtime For reflux   SACCHAROMYCES BOULARDII (FLORASTOR) 250 MG CAPSULE    Take 250 mg by mouth 2 (two) times daily.   SKIN PROTECTANTS, MISC. (CALAZIME SKIN PROTECTANT EX)    Apply to buttocks every day and night shift for skin integrity   TIOTROPIUM (SPIRIVA) 18 MCG INHALATION CAPSULE    Place 18 mcg into  inhaler and inhale daily.   TRAZODONE (DESYREL) 25 MG TABS TABLET    Take 25 mg by mouth at bedtime.   Modified Medications   No medications on file  Discontinued Medications   No medications on file     SIGNIFICANT DIAGNOSTIC EXAMS   LABS REVIEWED:    11-08-15:wbc 10.6; hgb 12.2; hct 40.7; mcv 89.;4 plt 377; glucose 205; bun 15.3; creat 0.56; k+ 4.0; na++135; liver normal albumin 4.0; hgb a1c 7.7  chol 154; ldl 76; trig 107; hdl 57 05-21-16: wbc 9.4; hgb 13.0; hct 39.9 ;mcv 86.5; plt 355; glucose 200; bun 13.0; creat 0.61; k+ 4.4 ;na++ 138; liver normal albumin 4.3; hgb a1c 9.0 chol 158; ldl 90; trig 81; hdl 52  06-25-16: urine micro-albumin: 1.2     Review of Systems Constitutional: Negative for appetite change and fatigue.  HENT: Negative for congestion.   Respiratory no cough and no shortness of breath  Cardiovascular: Negative for chest pain, palpitations and leg swelling.  Gastrointestinal: Negative for nausea, abdominal pain, diarrhea and constipation.  Musculoskeletal: Negative for myalgias and arthralgias.  Skin: Negative for pallor.  Neurological: Negative for dizziness.  Psychiatric/Behavioral: The patient is not nervous/anxious.       Physical Exam Constitutional: She is oriented to person, place, and time. No distress.  Overweight   Eyes: Conjunctivae are normal.  Neck: Neck supple. No JVD present. No thyromegaly present.  Cardiovascular: Normal rate, regular rhythm and intact distal pulses.   Respiratory: normal effort and rate; lung sounds clear  GI: Soft. Bowel sounds are normal. She exhibits no distension. There is no tenderness.  Musculoskeletal: She exhibits no edema.  Able to move all extremities   Lymphadenopathy:    She has no cervical adenopathy.  Neurological: She is alert and oriented to person, place, and time.  Skin: Skin is warm and dry. She is not diaphoretic.  Psychiatric: She has a normal mood and affect.     ASSESSMENT/PLAN  1.  Diabetes: her hgb a1c is 9.0.  Will continue  tradjenta 5  mg daily will increase her lantus to 15 units nightly she could not tolerate lisinopril will have staff cbg three times daily   Is on asa  and statin  MD is aware of resident's narcotic use and is in agreement with current plan of care. We will attempt to wean resident as apropriate   Ok Edwards NP [facility]  Contact [phone] Monday through Friday 8am- 5pm  After hours call [phone]

## 2016-10-19 NOTE — Progress Notes (Signed)
Location:   [facility]   Place of Service:  SNF (31)   CODE STATUS: full code   Allergies  Allergen Reactions  . Avelox [Moxifloxacin Hcl In Nacl] Hives  . Codeine     Reports her throat swelled in the past but has tolerated since.  . Compazine [Prochlorperazine Edisylate] Nausea And Vomiting  . Morphine And Related     Patient doesn't recall  . Mycobacterium   . Penicillins     Patient doesn't recall.  . Phenothiazines     Patient doesn't recall  . Prednisone     Insomnia, confusion.  Marland Kitchen Reglan [Metoclopramide]     Patient doesn't recall "but it was awful"  . Sulfonamide Derivatives Hives    Chief Complaint  Patient presents with  . Medical Management of Chronic Issues    HPI:  She is a long term resident of this facility being seen for the management of her chronic illnesses. Overall her status is stable. She tells me that she is feeling good. There are no nursing concerns at this time.    Past Medical History:  Diagnosis Date  . Abnormality of gait   . Anxiety state, unspecified   . Candidiasis of other urogenital sites   . Chronic airway obstruction, not elsewhere classified   . Depressive disorder, not elsewhere classified   . Esophageal reflux   . Lumbago   . Osteoporosis   . Spinal stenosis     Past Surgical History:  Procedure Laterality Date  . ABDOMINAL HYSTERECTOMY    . APPENDECTOMY    . CHOLECYSTECTOMY    . VERTEBROPLASTY      Social History   Social History  . Marital status: Divorced    Spouse name: N/A  . Number of children: N/A  . Years of education: N/A   Occupational History  . Not on file.   Social History Main Topics  . Smoking status: Former Research scientist (life sciences)  . Smokeless tobacco: Never Used  . Alcohol use No  . Drug use: No  . Sexual activity: No   Other Topics Concern  . Not on file   Social History Narrative  . No narrative on file   No family history on file.    VITAL SIGNS BP (!) 112/58   Pulse 98   Temp 98.2 F  (36.8 C)   Resp 18   Ht 5\' 2"  (1.575 m)   Wt 192 lb (87.1 kg)   SpO2 97%   BMI 35.12 kg/m   Patient's Medications  New Prescriptions   No medications on file  Previous Medications   ACETAMINOPHEN (TYLENOL) 325 MG TABLET    Take 325 mg by mouth every 4 (four) hours as needed (pain).    ALBUTEROL (PROVENTIL) (2.5 MG/3ML) 0.083% NEBULIZER SOLUTION    Take 2.5 mg by nebulization every 6 (six) hours as needed for wheezing or shortness of breath.   ASPIRIN EC 81 MG TABLET    Take 1 tablet (81 mg total) by mouth daily.   CHOLECALCIFEROL 10000 UNITS TABS    Take 2 tablets by mouth daily.   DULOXETINE (CYMBALTA) 60 MG CAPSULE    Take 60 mg by mouth daily. For depression   FLUTICASONE-SALMETEROL (ADVAIR) 250-50 MCG/DOSE AEPB    Inhale 1 puff into the lungs 2 (two) times daily. For COPD   GABAPENTIN (NEURONTIN) 100 MG CAPSULE    Take 1 capsule (100 mg total) by mouth 3 (three) times daily.   GEMFIBROZIL (LOPID) 600 MG TABLET  Take 600 mg by mouth 2 (two) times daily before a meal.    GUAIFENESIN (MUCINEX MAXIMUM STRENGTH) 1200 MG TB12    Take by mouth 2 (two) times daily.   INSULIN GLARGINE (LANTUS) 100 UNIT/ML INJECTION    Inject 10 Units into the skin at bedtime.    LIDOCAINE (LIDODERM) 5 %    Apply two patches to lower back in evening for pain. Remove & Discard patch after 12 hours.   LINAGLIPTIN (TRADJENTA) 5 MG TABS TABLET    Take 5 mg by mouth daily.   MEMANTINE HCL-DONEPEZIL HCL (NAMZARIC) 28-10 MG CP24    Take 1 capsule by mouth every evening. For dementia   PRAVASTATIN (PRAVACHOL) 40 MG TABLET    Take 40 mg by mouth every evening.    RANITIDINE (ZANTAC) 150 MG TABLET    Take 150 mg by mouth 2 (two) times daily. In the morning and at bedtime For reflux   SACCHAROMYCES BOULARDII (FLORASTOR) 250 MG CAPSULE    Take 250 mg by mouth 2 (two) times daily.   SKIN PROTECTANTS, MISC. (CALAZIME SKIN PROTECTANT EX)    Apply to buttocks every day and night shift for skin integrity   TIOTROPIUM  (SPIRIVA) 18 MCG INHALATION CAPSULE    Place 18 mcg into inhaler and inhale daily.   TRAZODONE (DESYREL) 25 MG TABS TABLET    Take 25 mg by mouth at bedtime.   Modified Medications   No medications on file  Discontinued Medications   No medications on file     SIGNIFICANT DIAGNOSTIC EXAMS   LABS REVIEWED:    11-08-15:wbc 10.6; hgb 12.2; hct 40.7; mcv 89.;4 plt 377; glucose 205; bun 15.3; creat 0.56; k+ 4.0; na++135; liver normal albumin 4.0; hgb a1c 7.7  chol 154; ldl 76; trig 107; hdl 57 05-21-16: wbc 9.4; hgb 13.0; hct 39.9 ;mcv 86.5; plt 355; glucose 200; bun 13.0; creat 0.61; k+ 4.4 ;na++ 138; liver normal albumin 4.3; hgb a1c 9.0 chol 158; ldl 90; trig 81; hdl 52  06-25-16: urine micro-albumin: 1.2     Review of Systems Constitutional: Negative for appetite change and fatigue.  HENT: Negative for congestion.   Respiratory no cough and no shortness of breath  Cardiovascular: Negative for chest pain, palpitations and leg swelling.  Gastrointestinal: Negative for nausea, abdominal pain, diarrhea and constipation.  Musculoskeletal: Negative for myalgias and arthralgias.  Skin: Negative for pallor.  Neurological: Negative for dizziness.  Psychiatric/Behavioral: The patient is not nervous/anxious.       Physical Exam Constitutional: She is oriented to person, place, and time. No distress.  Overweight   Eyes: Conjunctivae are normal.  Neck: Neck supple. No JVD present. No thyromegaly present.  Cardiovascular: Normal rate, regular rhythm and intact distal pulses.   Respiratory: normal effort and rate; lung sounds clear  GI: Soft. Bowel sounds are normal. She exhibits no distension. There is no tenderness.  Musculoskeletal: She exhibits no edema.  Able to move all extremities   Lymphadenopathy:    She has no cervical adenopathy.  Neurological: She is alert and oriented to person, place, and time.  Skin: Skin is warm and dry. She is not diaphoretic.  Psychiatric: She has a  normal mood and affect.     ASSESSMENT/PLAN   1. COPD:  will continue advair 250/50 twice daily;mucinex 1200 mg  twice daily spiriva 18 mcg daily and will begin duoneb every 6 hours for 3 days then every 6 hours as needed;    2. Dyslipidemia: will continue  pravachol  40 mg daily will continue lopid 600 mg twice daily ldl is 90; trig 52   3. Hypokalemia: will continue k+ 40 meq daily  4. Vascular dementia: without change in her status; will continue namzaric 28-10 mg daily and will monitor her weight is 202 pounds.   5. Gerd: will continue zantac 150 mg twice daily   6. Chronic low back pain: she is presently stable; will continue lidoderm 2 patches to lower back daily; neurontin 100 mg three times daily; cymbalta 60 mg daily  will monitor her status.   7. Anxiety: she is stable and is benefiting from cymbalta 60 mg daily will not make changes will continue trazodone 25 mg nightly for sleep.   8. Diabetes: her hgb a1c is 9.0.  Will continue  tradjenta 5  mg daily and lantus 10 units nightly   Is on asa  and statin     Will check hgb a1c     Ok Edwards NP [facility]  Contact [phone] Monday through Friday 8am- 5pm  After hours call [phone]

## 2016-11-01 NOTE — Progress Notes (Signed)
This encounter was created in error - please disregard.

## 2016-11-04 ENCOUNTER — Encounter: Payer: Self-pay | Admitting: Adult Health

## 2016-11-04 ENCOUNTER — Non-Acute Institutional Stay (SKILLED_NURSING_FACILITY): Payer: Medicare Other | Admitting: Adult Health

## 2016-11-04 DIAGNOSIS — J0100 Acute maxillary sinusitis, unspecified: Secondary | ICD-10-CM | POA: Diagnosis not present

## 2016-11-04 NOTE — Progress Notes (Signed)
Location:   [facility] Room Number: 324 M WNUUV of Service:  SNF (31)   CODE STATUS: Full Code   Allergies  Allergen Reactions  . Avelox [Moxifloxacin Hcl In Nacl] Hives  . Codeine     Reports her throat swelled in the past but has tolerated since.  . Compazine [Prochlorperazine Edisylate] Nausea And Vomiting  . Morphine And Related     Patient doesn't recall  . Mycobacterium   . Penicillins     Patient doesn't recall.  . Phenothiazines     Patient doesn't recall  . Prednisone     Insomnia, confusion.  Marland Kitchen Reglan [Metoclopramide]     Patient doesn't recall "but it was awful"  . Sulfonamide Derivatives Hives    Chief Complaint  Patient presents with  . Acute Visit    Headache    HPI:  She is complaining of increased sinus congestion and headache. Has a nonproductive cough. There are no reports of fever present. She tells me that she has had theses symptoms for a long time.   Past Medical History:  Diagnosis Date  . Abnormality of gait   . Anxiety state, unspecified   . Candidiasis of other urogenital sites   . Chronic airway obstruction, not elsewhere classified   . Depressive disorder, not elsewhere classified   . Esophageal reflux   . Lumbago   . Osteoporosis   . Spinal stenosis     Past Surgical History:  Procedure Laterality Date  . ABDOMINAL HYSTERECTOMY    . APPENDECTOMY    . CHOLECYSTECTOMY    . VERTEBROPLASTY      Social History   Social History  . Marital status: Divorced    Spouse name: N/A  . Number of children: N/A  . Years of education: N/A   Occupational History  . Not on file.   Social History Main Topics  . Smoking status: Former Research scientist (life sciences)  . Smokeless tobacco: Never Used  . Alcohol use No  . Drug use: No  . Sexual activity: No   Other Topics Concern  . Not on file   Social History Narrative  . No narrative on file   History reviewed. No pertinent family history.    VITAL SIGNS Ht 5\' 2"  (1.575 m)   Wt 188  lb (85.3 kg)   BMI 34.39 kg/m   Patient's Medications  New Prescriptions   No medications on file  Previous Medications   ACETAMINOPHEN (TYLENOL) 325 MG TABLET    Take 325 mg by mouth every 4 (four) hours as needed (pain).    ALBUTEROL (PROVENTIL) (2.5 MG/3ML) 0.083% NEBULIZER SOLUTION    Take 2.5 mg by nebulization every 6 (six) hours as needed for wheezing or shortness of breath.   ASPIRIN EC 81 MG TABLET    Take 1 tablet (81 mg total) by mouth daily.   CHOLECALCIFEROL 10000 UNITS TABS    Take 2 tablets by mouth daily.   DULOXETINE (CYMBALTA) 60 MG CAPSULE    Take 60 mg by mouth daily. For depression   FLUTICASONE-SALMETEROL (ADVAIR) 250-50 MCG/DOSE AEPB    Inhale 1 puff into the lungs 2 (two) times daily. For COPD   GABAPENTIN (NEURONTIN) 100 MG CAPSULE    Take 1 capsule (100 mg total) by mouth 3 (three) times daily.   GEMFIBROZIL (LOPID) 600 MG TABLET    Take 600 mg by mouth 2 (two) times daily before a meal.    GUAIFENESIN (MUCINEX MAXIMUM STRENGTH) 1200 MG TB12  Take by mouth 2 (two) times daily.   INSULIN GLARGINE (LANTUS) 100 UNIT/ML INJECTION    Inject 20 Units into the skin at bedtime.    LIDOCAINE (LIDODERM) 5 %    Apply two patches to lower back in evening for pain. Remove & Discard patch after 12 hours.   LINAGLIPTIN (TRADJENTA) 5 MG TABS TABLET    Take 5 mg by mouth daily.   MEMANTINE HCL-DONEPEZIL HCL (NAMZARIC) 28-10 MG CP24    Take 1 capsule by mouth every evening. For dementia   PRAVASTATIN (PRAVACHOL) 40 MG TABLET    Take 40 mg by mouth every evening.    RANITIDINE (ZANTAC) 150 MG TABLET    Take 150 mg by mouth 2 (two) times daily. In the morning and at bedtime For reflux   SACCHAROMYCES BOULARDII (FLORASTOR) 250 MG CAPSULE    Take 250 mg by mouth 2 (two) times daily.   SKIN PROTECTANTS, MISC. (CALAZIME SKIN PROTECTANT EX)    Apply to buttocks every day and night shift for skin integrity   TIOTROPIUM (SPIRIVA) 18 MCG INHALATION CAPSULE    Place 18 mcg into inhaler and  inhale daily.   TRAZODONE (DESYREL) 25 MG TABS TABLET    Take 25 mg by mouth at bedtime.   Modified Medications   No medications on file  Discontinued Medications   No medications on file     SIGNIFICANT DIAGNOSTIC EXAMS  LABS REVIEWED:    11-08-15:wbc 10.6; hgb 12.2; hct 40.7; mcv 89.;4 plt 377; glucose 205; bun 15.3; creat 0.56; k+ 4.0; na++135; liver normal albumin 4.0; hgb a1c 7.7  chol 154; ldl 76; trig 107; hdl 57 05-21-16: wbc 9.4; hgb 13.0; hct 39.9 ;mcv 86.5; plt 355; glucose 200; bun 13.0; creat 0.61; k+ 4.4 ;na++ 138; liver normal albumin 4.3; hgb a1c 9.0 chol 158; ldl 90; trig 81; hdl 52  06-25-16: urine micro-albumin: 1.2  09-21-16: hgb a1c 8.6 09-25-16: hgb a1c 8.4    Review of Systems Constitutional: Negative for appetite change and fatigue.  HENT: has sinus congestion and headache Respiratory: no shortness of breath has cough  Cardiovascular: Negative for chest pain, palpitations and leg swelling.  Gastrointestinal: Negative for nausea, abdominal pain, diarrhea and constipation.  Musculoskeletal: Negative for myalgias and arthralgias.  Skin: Negative for pallor.  Neurological: Negative for dizziness.  Psychiatric/Behavioral: The patient is not nervous/anxious.       Physical Exam Constitutional: She is oriented to person, place, and time. No distress.  Overweight Has facial tenderness and slight facial swelling present    Eyes: Conjunctivae are normal.  Neck: Neck supple. No JVD present. No thyromegaly present.  Cardiovascular: Normal rate, regular rhythm and intact distal pulses.   Respiratory: lung sounds are clear no wheezing  GI: Soft. Bowel sounds are normal. She exhibits no distension. There is no tenderness.  Musculoskeletal: She exhibits no edema.  Able to move all extremities   Lymphadenopathy:    She has no cervical adenopathy.  Neurological: She is alert and oriented to person, place, and time.  Skin: Skin is warm and dry. She is not  diaphoretic.  Psychiatric: She has a normal mood and affect.     ASSESSMENT/PLAN  1.  Acute sinusitis: will begin flonase 2 puffs per nostril nightly will begin doxycycline 100 mg twice daily for one week with florastor   Ok Edwards NP [facility]  Contact [phone] Monday through Friday 8am- 5pm  After hours call [phone]

## 2016-11-13 DIAGNOSIS — G4700 Insomnia, unspecified: Secondary | ICD-10-CM | POA: Diagnosis not present

## 2016-11-13 DIAGNOSIS — F0390 Unspecified dementia without behavioral disturbance: Secondary | ICD-10-CM | POA: Diagnosis not present

## 2016-11-13 DIAGNOSIS — F329 Major depressive disorder, single episode, unspecified: Secondary | ICD-10-CM | POA: Diagnosis not present

## 2016-11-17 ENCOUNTER — Non-Acute Institutional Stay (SKILLED_NURSING_FACILITY): Payer: Medicare Other | Admitting: Internal Medicine

## 2016-11-17 DIAGNOSIS — E1139 Type 2 diabetes mellitus with other diabetic ophthalmic complication: Secondary | ICD-10-CM | POA: Diagnosis not present

## 2016-11-17 DIAGNOSIS — J441 Chronic obstructive pulmonary disease with (acute) exacerbation: Secondary | ICD-10-CM

## 2016-11-17 DIAGNOSIS — F0151 Vascular dementia with behavioral disturbance: Secondary | ICD-10-CM

## 2016-11-17 DIAGNOSIS — E1165 Type 2 diabetes mellitus with hyperglycemia: Secondary | ICD-10-CM

## 2016-11-17 DIAGNOSIS — F0152 Vascular dementia, unspecified severity, with psychotic disturbance: Secondary | ICD-10-CM

## 2016-11-17 DIAGNOSIS — F22 Delusional disorders: Secondary | ICD-10-CM | POA: Diagnosis not present

## 2016-11-17 DIAGNOSIS — E876 Hypokalemia: Secondary | ICD-10-CM

## 2016-11-17 DIAGNOSIS — F0150 Vascular dementia without behavioral disturbance: Secondary | ICD-10-CM

## 2016-11-17 DIAGNOSIS — M545 Low back pain: Secondary | ICD-10-CM

## 2016-11-17 DIAGNOSIS — G8929 Other chronic pain: Secondary | ICD-10-CM

## 2016-11-17 NOTE — Progress Notes (Signed)
Location:   [facility] Room Number: 626 R SWNIO of Service:  SNF (31)   CODE STATUS: Full Code       Allergies  Allergen Reactions  . Avelox [Moxifloxacin Hcl In Nacl] Hives  . Codeine     Reports her throat swelled in the past but has tolerated since.  . Compazine [Prochlorperazine Edisylate] Nausea And Vomiting  . Morphine And Related     Patient doesn't recall  . Mycobacterium   . Penicillins     Patient doesn't recall.  . Phenothiazines     Patient doesn't recall  . Prednisone     Insomnia, confusion.  Marland Kitchen Reglan [Metoclopramide]     Patient doesn't recall "but it was awful"  . Sulfonamide Derivatives Hives        Chief Complaint  Patient presents with  . Medical Management of Chronic Issues    Routine Visit  For medical management of COPD-dementia-back pain-anxiety-diabetes type 2  HPI:  She is a long term resident of this facility being seen for the management of her chronic illnesses. Overall her status is stable. She is not voicing any complaints at this time. Her.  She recently completed a course of doxycycline for suspected sinusitis she says she is feeling better does not complaining any cough shortness of breath or headache  She does have a history of COPD continues on Advair as well as Mucinex and Spiriva.  She also has a history of type 2 diabetes she is on Lantus 20 units daily at bedtime as well as Tradjenta 5 mg a day-blood sugars appear to be fairly stable generally in the low mid 100s with occasional but not frequent spikes into the 200+ range  Currently she is resting in bed comfortably and he has no complaints appears to be in good spirits        Past Medical History:  Diagnosis Date  . Abnormality of gait   . Anxiety state, unspecified   . Candidiasis of other urogenital sites   . Chronic airway obstruction, not elsewhere classified   . Depressive disorder, not elsewhere classified   .  Esophageal reflux   . Lumbago   . Osteoporosis   . Spinal stenosis          Past Surgical History:  Procedure Laterality Date  . ABDOMINAL HYSTERECTOMY    . APPENDECTOMY    . CHOLECYSTECTOMY    . VERTEBROPLASTY      Social History        Social History  . Marital status: Divorced    Spouse name: N/A  . Number of children: N/A  . Years of education: N/A      Occupational History  . Not on file.       Social History Main Topics  . Smoking status: Former Research scientist (life sciences)  . Smokeless tobacco: Never Used  . Alcohol use No  . Drug use: No  . Sexual activity: No       Other Topics Concern  . Not on file      Social History Narrative  . No narrative on file   History reviewed. No pertinent family history.    VITAL SIGNS  Temperature is 97.8 pulse 79 respirations 16 blood pressure 100/53-112/58 most recently.  Weight is stable at 187.6 pounds      Patient's Medications  New Prescriptions   No medications on file  Previous Medications   ACETAMINOPHEN (TYLENOL) 325 MG TABLET    Take 325 mg by mouth  every 4 (four) hours as needed (pain).    ALBUTEROL (PROVENTIL) (2.5 MG/3ML) 0.083% NEBULIZER SOLUTION    Take 2.5 mg by nebulization every 6 (six) hours as needed for wheezing or shortness of breath.   ASPIRIN EC 81 MG TABLET    Take 1 tablet (81 mg total) by mouth daily.   CHOLECALCIFEROL 10000 UNITS TABS    Take 2 tablets by mouth daily.   DULOXETINE (CYMBALTA) 60 MG CAPSULE    Take 60 mg by mouth daily. For depression   FLUTICASONE-SALMETEROL (ADVAIR) 250-50 MCG/DOSE AEPB    Inhale 1 puff into the lungs 2 (two) times daily. For COPD   GABAPENTIN (NEURONTIN) 100 MG CAPSULE    Take 1 capsule (100 mg total) by mouth 3 (three) times daily.   GEMFIBROZIL (LOPID) 600 MG TABLET    Take 600 mg by mouth 2 (two) times daily before a meal.    GUAIFENESIN (MUCINEX MAXIMUM STRENGTH) 1200 MG TB12    Take by mouth 2 (two) times daily.   INSULIN  GLARGINE (LANTUS) 100 UNIT/ML INJECTION    Inject 20 Units into the skin at bedtime.    LIDOCAINE (LIDODERM) 5 %    Apply two patches to lower back in evening for pain. Remove & Discard patch after 12 hours.   LINAGLIPTIN (TRADJENTA) 5 MG TABS TABLET    Take 5 mg by mouth daily.   MEMANTINE HCL-DONEPEZIL HCL (NAMZARIC) 28-10 MG CP24    Take 1 capsule by mouth every evening. For dementia   PRAVASTATIN (PRAVACHOL) 40 MG TABLET    Take 40 mg by mouth every evening.    RANITIDINE (ZANTAC) 150 MG TABLET    Take 150 mg by mouth 2 (two) times daily. In the morning and at bedtime For reflux   SACCHAROMYCES BOULARDII (FLORASTOR) 250 MG CAPSULE    Take 250 mg by mouth 2 (two) times daily.   SKIN PROTECTANTS, MISC. (CALAZIME SKIN PROTECTANT EX)    Apply to buttocks every day and night shift for skin integrity   TIOTROPIUM (SPIRIVA) 18 MCG INHALATION CAPSULE    Place 18 mcg into inhaler and inhale daily.   TRAZODONE (DESYREL) 25 MG TABS TABLET    Take 25 mg by mouth at bedtime.   Modified Medications   No medications on file  Discontinued Medications   No medications on file     SIGNIFICANT DIAGNOSTIC EXAMS  LABS REVIEWED:    11-08-15:wbc 10.6; hgb 12.2; hct 40.7; mcv 89.;4 plt 377; glucose 205; bun 15.3; creat 0.56; k+ 4.0; na++135; liver normal albumin 4.0; hgb a1c 7.7  chol 154; ldl 76; trig 107; hdl 57 05-21-16: wbc 9.4; hgb 13.0; hct 39.9 ;mcv 86.5; plt 355; glucose 200; bun 13.0; creat 0.61; k+ 4.4 ;na++ 138; liver normal albumin 4.3; hgb a1c 9.0 chol 158; ldl 90; trig 81; hdl 52  06-25-16: urine micro-albumin: 1.2  09-21-16: hgb a1c 8.6 09-25-16: hgb a1c 8.4      Review of Systems Constitutional: Negative for appetite change and fatigue. her weight has been stable  HENT: Negative for congestion.   Respiratory: no cough or shortness of breath  Cardiovascular: Negative for chest pain, palpitations and leg swelling.  Gastrointestinal: Negative for nausea, abdominal pain,  diarrhea and constipation.  Musculoskeletal: Negative for myalgias and arthralgias.  Skin: Negative for palloror itching.  Neurological: Negative for dizziness. Or headache  Psychiatric/Behavioral: The patient is not nervous/anxious.       Physical Exam Constitutional: She is oriented to person, place, and  time. No distress.  Overweight   Eyes: Conjunctivae are normal. Some slight left eye ptosis  Neck: Neck supple. No JVD present. No thyromegaly present.  Cardiovascular: Normal rate, regular rhythm and intact distal pulses.   Respiratory: lung sounds are clear no wheezing no labored breathing GI: Soft. Bowel sounds are normal. She exhibits no distension. There is no tenderness. Abdomen is obese  Musculoskeletal: She exhibits no edema.  Able to move all extremities   Lymphadenopathy:    She has no cervical adenopathy.  Neurological: She is alert and oriented to person, place, and time. No lateralizing findings Skin: Skin is warm and dry. She is not diaphoretic.  Psychiatric: She has a normal mood and affect pleasant and appropriate.     ASSESSMENT/PLAN  1. COPD:  will continue advair 250/50 twice daily;mucinex 1200 mg  twice daily spiriva 18 mcg daily  Recently treated for sinusitis this appears resolved  2. Dyslipidemia: will continue pravachol  40 mg daily will continue lopid 600 mg twice daily ldl is 90; trig 52   3. History of hypokalemia will update a metabolic panel   4. Vascular dementia: without change in her status; will continue namzaric 28-10 mg daily and will monitor her weight is 187 pounds.   5. Gerd: will continue zantac 150 mg twice daily   6. Chronic low back pain: she is presently stable; will continue lidoderm 2 patches to lower back daily; neurontin 100 mg three times daily; cymbalta 60 mg daily  will monitor her status.   7. Anxiety: she is stable and is benefiting from cymbalta 60 mg daily will not make changes will continue trazodone 25 mg  nightly for sleep.   8. Diabetes: her hgb a1c is 8.4.  Will continue  tradjenta 5  mg daily recently increased lantus 20 units nightly   will continue lisinopril 2.5 mg daily and asa 81 mg daily Sugars and I suspect to have improved with running largely in the low to mid 100s occasional spikes above 200,     Again will update a CBC and CMP for updated values clinically she appears stable.  [id number]

## 2016-11-18 DIAGNOSIS — E785 Hyperlipidemia, unspecified: Secondary | ICD-10-CM | POA: Diagnosis not present

## 2016-11-18 DIAGNOSIS — D649 Anemia, unspecified: Secondary | ICD-10-CM | POA: Diagnosis not present

## 2016-11-18 DIAGNOSIS — E0840 Diabetes mellitus due to underlying condition with diabetic neuropathy, unspecified: Secondary | ICD-10-CM | POA: Diagnosis not present

## 2016-11-18 DIAGNOSIS — E11628 Type 2 diabetes mellitus with other skin complications: Secondary | ICD-10-CM | POA: Diagnosis not present

## 2016-11-18 DIAGNOSIS — E11618 Type 2 diabetes mellitus with other diabetic arthropathy: Secondary | ICD-10-CM | POA: Diagnosis not present

## 2016-11-18 LAB — CBC AND DIFFERENTIAL
HCT: 41 % (ref 36–46)
HEMOGLOBIN: 13.1 g/dL (ref 12.0–16.0)
Neutrophils Absolute: 7 /uL
Platelets: 305 10*3/uL (ref 150–399)
WBC: 10.3 10^3/mL

## 2016-11-18 LAB — HEPATIC FUNCTION PANEL
ALT: 5 U/L — AB (ref 7–35)
AST: 8 U/L — AB (ref 13–35)
Alkaline Phosphatase: 96 U/L (ref 25–125)
Bilirubin, Total: 0.3 mg/dL

## 2016-11-18 LAB — BASIC METABOLIC PANEL
BUN: 13 mg/dL (ref 4–21)
CREATININE: 0.5 mg/dL (ref 0.5–1.1)
Glucose: 125 mg/dL
POTASSIUM: 3.9 mmol/L (ref 3.4–5.3)
SODIUM: 145 mmol/L (ref 137–147)

## 2016-11-23 DIAGNOSIS — H40023 Open angle with borderline findings, high risk, bilateral: Secondary | ICD-10-CM | POA: Diagnosis not present

## 2016-11-23 DIAGNOSIS — E113293 Type 2 diabetes mellitus with mild nonproliferative diabetic retinopathy without macular edema, bilateral: Secondary | ICD-10-CM | POA: Diagnosis not present

## 2016-11-23 DIAGNOSIS — Z794 Long term (current) use of insulin: Secondary | ICD-10-CM | POA: Diagnosis not present

## 2016-11-23 DIAGNOSIS — Z961 Presence of intraocular lens: Secondary | ICD-10-CM | POA: Diagnosis not present

## 2016-12-25 ENCOUNTER — Non-Acute Institutional Stay (SKILLED_NURSING_FACILITY): Payer: Medicare Other | Admitting: Adult Health

## 2016-12-25 ENCOUNTER — Encounter: Payer: Self-pay | Admitting: Adult Health

## 2016-12-25 DIAGNOSIS — F22 Delusional disorders: Secondary | ICD-10-CM | POA: Diagnosis not present

## 2016-12-25 DIAGNOSIS — E1165 Type 2 diabetes mellitus with hyperglycemia: Secondary | ICD-10-CM | POA: Diagnosis not present

## 2016-12-25 DIAGNOSIS — G8929 Other chronic pain: Secondary | ICD-10-CM | POA: Diagnosis not present

## 2016-12-25 DIAGNOSIS — F0150 Vascular dementia without behavioral disturbance: Secondary | ICD-10-CM

## 2016-12-25 DIAGNOSIS — E785 Hyperlipidemia, unspecified: Secondary | ICD-10-CM | POA: Diagnosis not present

## 2016-12-25 DIAGNOSIS — J441 Chronic obstructive pulmonary disease with (acute) exacerbation: Secondary | ICD-10-CM

## 2016-12-25 DIAGNOSIS — E1159 Type 2 diabetes mellitus with other circulatory complications: Secondary | ICD-10-CM | POA: Diagnosis not present

## 2016-12-25 DIAGNOSIS — I10 Essential (primary) hypertension: Secondary | ICD-10-CM | POA: Diagnosis not present

## 2016-12-25 DIAGNOSIS — E1169 Type 2 diabetes mellitus with other specified complication: Secondary | ICD-10-CM | POA: Diagnosis not present

## 2016-12-25 DIAGNOSIS — F0151 Vascular dementia with behavioral disturbance: Secondary | ICD-10-CM | POA: Diagnosis not present

## 2016-12-25 DIAGNOSIS — F0152 Vascular dementia, unspecified severity, with psychotic disturbance: Secondary | ICD-10-CM

## 2016-12-25 DIAGNOSIS — M545 Low back pain, unspecified: Secondary | ICD-10-CM

## 2016-12-25 DIAGNOSIS — F339 Major depressive disorder, recurrent, unspecified: Secondary | ICD-10-CM

## 2016-12-25 DIAGNOSIS — E1139 Type 2 diabetes mellitus with other diabetic ophthalmic complication: Secondary | ICD-10-CM

## 2016-12-25 NOTE — Progress Notes (Signed)
Location:   [facility] Room Number: 242 A STMHD of Service:  SNF (31)   CODE STATUS: Full Code  Allergies  Allergen Reactions  . Avelox [Moxifloxacin Hcl In Nacl] Hives  . Codeine     Reports her throat swelled in the past but has tolerated since.  . Compazine [Prochlorperazine Edisylate] Nausea And Vomiting  . Morphine And Related     Patient doesn't recall  . Mycobacterium   . Penicillins     Patient doesn't recall.  . Phenothiazines     Patient doesn't recall  . Prednisone     Insomnia, confusion.  Marland Kitchen Reglan [Metoclopramide]     Patient doesn't recall "but it was awful"  . Sulfonamide Derivatives Hives    Chief Complaint  Patient presents with  . Medical Management of Chronic Issues    1 month follow up    HPI:  She is a long term resident of this facility being seen for the management of her chronic illnesses. Overall there is little change in her status. She does spend nearly all of her time in bed per her choice. She is not voicing any complaints at this time and there are no nursing concerns a this time.    Past Medical History:  Diagnosis Date  . Abnormality of gait   . Anxiety state, unspecified   . Candidiasis of other urogenital sites   . Chronic airway obstruction, not elsewhere classified   . Depressive disorder, not elsewhere classified   . Esophageal reflux   . Lumbago   . Osteoporosis   . Spinal stenosis     Past Surgical History:  Procedure Laterality Date  . ABDOMINAL HYSTERECTOMY    . APPENDECTOMY    . CHOLECYSTECTOMY    . VERTEBROPLASTY      Social History   Social History  . Marital status: Divorced    Spouse name: N/A  . Number of children: N/A  . Years of education: N/A   Occupational History  . Not on file.   Social History Main Topics  . Smoking status: Former Research scientist (life sciences)  . Smokeless tobacco: Never Used  . Alcohol use No  . Drug use: No  . Sexual activity: No   Other Topics Concern  . Not on file    Social History Narrative  . No narrative on file   History reviewed. No pertinent family history.    VITAL SIGNS BP (!) 112/58   Pulse 78   Temp (!) 95.4 F (35.2 C)   Resp 18   Ht 5\' 2"  (1.575 m)   Wt 186 lb 9.6 oz (84.6 kg)   SpO2 92%   BMI 34.13 kg/m   Patient's Medications  New Prescriptions   No medications on file  Previous Medications   ACETAMINOPHEN (TYLENOL) 325 MG TABLET    Take 325 mg by mouth every 4 (four) hours as needed (pain).    ALBUTEROL (PROVENTIL) (2.5 MG/3ML) 0.083% NEBULIZER SOLUTION    Take 2.5 mg by nebulization every 6 (six) hours as needed for wheezing or shortness of breath.   ASPIRIN EC 81 MG TABLET    Take 1 tablet (81 mg total) by mouth daily.   CHOLECALCIFEROL 1000 UNITS TABLET    Take 1,000 Units by mouth at bedtime.   DULOXETINE (CYMBALTA) 60 MG CAPSULE    Take 60 mg by mouth daily. For depression   FLUTICASONE (FLONASE) 50 MCG/ACT NASAL SPRAY    Place 2 sprays into both nostrils at bedtime.  FLUTICASONE-SALMETEROL (ADVAIR) 250-50 MCG/DOSE AEPB    Inhale 1 puff into the lungs 2 (two) times daily. For COPD   GABAPENTIN (NEURONTIN) 100 MG CAPSULE    Take 1 capsule (100 mg total) by mouth 3 (three) times daily.   GEMFIBROZIL (LOPID) 600 MG TABLET    Take 600 mg by mouth 2 (two) times daily before a meal.    GUAIFENESIN (MUCINEX MAXIMUM STRENGTH) 1200 MG TB12    Take by mouth 2 (two) times daily.   INSULIN GLARGINE (LANTUS) 100 UNIT/ML INJECTION    Inject 20 Units into the skin at bedtime.    LIDOCAINE (LIDODERM) 5 %    Apply two patches to lower back daily for pain. Remove & Discard patch after 12 hours.   LINAGLIPTIN (TRADJENTA) 5 MG TABS TABLET    Take 5 mg by mouth daily.   MEMANTINE HCL-DONEPEZIL HCL (NAMZARIC) 28-10 MG CP24    Take 1 capsule by mouth every evening. For dementia   PRAVASTATIN (PRAVACHOL) 40 MG TABLET    Take 40 mg by mouth every evening.    RANITIDINE (ZANTAC) 150 MG TABLET    Take 150 mg by mouth 2 (two) times daily. In  the morning and at bedtime For reflux   SACCHAROMYCES BOULARDII (FLORASTOR) 250 MG CAPSULE    Take 250 mg by mouth 2 (two) times daily.   SKIN PROTECTANTS, MISC. (CALAZIME SKIN PROTECTANT EX)    Apply to buttocks every day and night shift for skin integrity   TIOTROPIUM (SPIRIVA) 18 MCG INHALATION CAPSULE    Place 18 mcg into inhaler and inhale daily.   TRAZODONE (DESYREL) 25 MG TABS TABLET    Take 25 mg by mouth at bedtime.   Modified Medications   No medications on file  Discontinued Medications   CHOLECALCIFEROL 10000 UNITS TABS    Take 2 tablets by mouth daily.     SIGNIFICANT DIAGNOSTIC EXAMS  LABS REVIEWED:    05-21-16: wbc 9.4; hgb 13.0; hct 39.9 ;mcv 86.5; plt 355; glucose 200; bun 13.0; creat 0.61; k+ 4.4 ;na++ 138; liver normal albumin 4.3; hgb a1c 9.0 chol 158; ldl 90; trig 81; hdl 52  06-25-16: urine micro-albumin: 1.2  09-21-16: hgb a1c 8.6 09-25-16: hgb a1c 8.4    Review of Systems Constitutional: Negative for appetite change and fatigue.  HENT no complaints of congestion no headaches Respiratory: no shortness of breath no cough  Cardiovascular: Negative for chest pain, palpitations and leg swelling.  Gastrointestinal: Negative for nausea, abdominal pain, diarrhea and constipation.  Musculoskeletal: Negative for myalgias and arthralgias.  Skin: Negative for pallor.  Neurological: Negative for dizziness.  Psychiatric/Behavioral: The patient is not nervous/anxious.       Physical Exam Constitutional: She is oriented to person, place, and time. No distress.  Overweight    Eyes: Conjunctivae are normal.  Neck: Neck supple. No JVD present. No thyromegaly present.  Cardiovascular: Normal rate, regular rhythm and intact distal pulses.   Respiratory: lung sounds are clear no wheezing  GI: Soft. Bowel sounds are normal. She exhibits no distension. There is no tenderness.  Musculoskeletal: She exhibits no edema.  Able to move all extremities   Lymphadenopathy:    She  has no cervical adenopathy.  Neurological: She is alert and oriented to person, place, and time.  Skin: Skin is warm and dry. She is not diaphoretic.  Psychiatric: She has a normal mood and affect.     ASSESSMENT/PLAN  1. COPD:  will continue advair 250/50 twice daily;mucinex 1200  mg  twice daily spiriva 18 mcg daily   2. Dyslipidemia: will continue pravachol  40 mg daily will continue lopid 600 mg twice daily ldl is 90; trig 52   3. Hypokalemia: will continue k+ 40 meq daily  4. Vascular dementia: without change in her status; will continue namzaric 28-10 mg daily and will monitor her weight is 186 pounds.   5. Gerd: will continue zantac 150 mg twice daily   6. Chronic low back pain: she is presently stable; will continue lidoderm 2 patches to lower back daily; neurontin 100 mg three times daily; cymbalta 60 mg daily  will monitor her status.   7. Anxiety: she is stable and is benefiting from cymbalta 60 mg daily will not make changes will continue trazodone 25 mg nightly for sleep.   8. Diabetes: her hgb a1c is 8.4.  Will continue  tradjenta 5  mg daily will change to lantus 20 units nightly    9.  Hypertension: b/p 112/58 will continue lisinopril 2.5 mg daily and asa 81 mg daily       Will check cbc; cmp lipids hgb a1c   MD is aware of resident's narcotic use and is in agreement with current plan of care. We will attempt to wean resident as apropriate     Ok Edwards NP [facility]  Contact [phone] Monday through Friday 8am- 5pm  After hours call [phone]

## 2016-12-26 DIAGNOSIS — E11628 Type 2 diabetes mellitus with other skin complications: Secondary | ICD-10-CM | POA: Diagnosis not present

## 2016-12-26 DIAGNOSIS — E785 Hyperlipidemia, unspecified: Secondary | ICD-10-CM | POA: Diagnosis not present

## 2016-12-26 DIAGNOSIS — E0840 Diabetes mellitus due to underlying condition with diabetic neuropathy, unspecified: Secondary | ICD-10-CM | POA: Diagnosis not present

## 2016-12-26 DIAGNOSIS — D649 Anemia, unspecified: Secondary | ICD-10-CM | POA: Diagnosis not present

## 2016-12-26 DIAGNOSIS — E11618 Type 2 diabetes mellitus with other diabetic arthropathy: Secondary | ICD-10-CM | POA: Diagnosis not present

## 2016-12-26 DIAGNOSIS — E119 Type 2 diabetes mellitus without complications: Secondary | ICD-10-CM | POA: Diagnosis not present

## 2016-12-26 LAB — BASIC METABOLIC PANEL
BUN: 14 mg/dL (ref 4–21)
Creatinine: 0.5 mg/dL (ref 0.5–1.1)
GLUCOSE: 112 mg/dL
Potassium: 4.1 mmol/L (ref 3.4–5.3)
SODIUM: 141 mmol/L (ref 137–147)

## 2016-12-26 LAB — HEPATIC FUNCTION PANEL
ALT: 9 U/L (ref 7–35)
AST: 9 U/L — AB (ref 13–35)
Alkaline Phosphatase: 85 U/L (ref 25–125)
BILIRUBIN, TOTAL: 0.2 mg/dL

## 2016-12-26 LAB — CBC AND DIFFERENTIAL
HCT: 39 % (ref 36–46)
Hemoglobin: 13 g/dL (ref 12.0–16.0)
NEUTROS ABS: 7 /uL
Platelets: 327 10*3/uL (ref 150–399)
WBC: 10.2 10^3/mL

## 2016-12-26 LAB — HEMOGLOBIN A1C: Hemoglobin A1C: 7.3

## 2016-12-29 ENCOUNTER — Encounter: Payer: Self-pay | Admitting: Adult Health

## 2016-12-29 ENCOUNTER — Non-Acute Institutional Stay (SKILLED_NURSING_FACILITY): Payer: Medicare Other | Admitting: Adult Health

## 2016-12-29 DIAGNOSIS — N390 Urinary tract infection, site not specified: Secondary | ICD-10-CM | POA: Diagnosis not present

## 2016-12-29 DIAGNOSIS — Z79899 Other long term (current) drug therapy: Secondary | ICD-10-CM | POA: Diagnosis not present

## 2016-12-29 DIAGNOSIS — E11618 Type 2 diabetes mellitus with other diabetic arthropathy: Secondary | ICD-10-CM | POA: Diagnosis not present

## 2016-12-29 DIAGNOSIS — E0840 Diabetes mellitus due to underlying condition with diabetic neuropathy, unspecified: Secondary | ICD-10-CM | POA: Diagnosis not present

## 2016-12-29 DIAGNOSIS — R319 Hematuria, unspecified: Secondary | ICD-10-CM | POA: Diagnosis not present

## 2016-12-29 DIAGNOSIS — D649 Anemia, unspecified: Secondary | ICD-10-CM | POA: Diagnosis not present

## 2016-12-29 NOTE — Progress Notes (Signed)
Location:   [facility] Room Number: 557 D UKGUR of Service:  SNF (31)   CODE STATUS: Full Code  Allergies  Allergen Reactions  . Avelox [Moxifloxacin Hcl In Nacl] Hives  . Codeine     Reports her throat swelled in the past but has tolerated since.  . Compazine [Prochlorperazine Edisylate] Nausea And Vomiting  . Morphine And Related     Patient doesn't recall  . Mycobacterium   . Penicillins     Patient doesn't recall.  . Phenothiazines     Patient doesn't recall  . Prednisone     Insomnia, confusion.  Marland Kitchen Reglan [Metoclopramide]     Patient doesn't recall "but it was awful"  . Sulfonamide Derivatives Hives    Chief Complaint  Patient presents with  . Acute Visit    Possible UTI    HPI:  She is having severe pain when she voids; she tells me it makes her cry. She tells me that her stomach hurts as well.  There are no reports of fever present. There are no reports of change in appetite.    Past Medical History:  Diagnosis Date  . Abnormality of gait   . Anxiety state, unspecified   . Candidiasis of other urogenital sites   . Chronic airway obstruction, not elsewhere classified   . Chronic obstructive lung disease ([place])   . Depressive disorder, not elsewhere classified   . Esophageal reflux   . Hyperlipemia   . Lumbago   . Osteoporosis   . Spinal stenosis   . 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 Unspecified dementia without behavioral disturbance     Past Surgical History:  Procedure Laterality Date  . ABDOMINAL HYSTERECTOMY    . APPENDECTOMY    . CHOLECYSTECTOMY    . VERTEBROPLASTY      Social History   Social History  . Marital status: Divorced    Spouse name: N/A  . Number of children: N/A  . Years of education: N/A   Occupational History  . Not on file.   Social History Main Topics  . Smoking status: Former Research scientist (life sciences)  . Smokeless tobacco: Never Used  . Alcohol use No  . Drug use: No  . Sexual  activity: No   Other Topics Concern  . Not on file   Social History Narrative  . No narrative on file   History reviewed. No pertinent family history.    VITAL SIGNS BP (!) 161/60   Pulse 72   Temp 97.1 F (36.2 C)   Resp 18   Ht 5\' 2"  (1.575 m)   Wt 186 lb 9.6 oz (84.6 kg)   SpO2 99%   BMI 34.13 kg/m   Patient's Medications  New Prescriptions   No medications on file  Previous Medications   ACETAMINOPHEN (TYLENOL) 325 MG TABLET    Take 325 mg by mouth every 4 (four) hours as needed (pain).    ALBUTEROL (PROVENTIL) (2.5 MG/3ML) 0.083% NEBULIZER SOLUTION    Take 2.5 mg by nebulization every 6 (six) hours as needed for wheezing or shortness of breath.   ASPIRIN EC 81 MG TABLET    Take 1 tablet (81 mg total) by mouth daily.   CHOLECALCIFEROL 1000 UNITS TABLET    Take 1,000 Units by mouth at bedtime.   DULOXETINE (CYMBALTA) 60 MG CAPSULE    Take 60 mg by mouth daily. For depression   ERTAPENEM (INVANZ) IVPB    Inject into the  vein. Use 1 gram IV in rhe afternoon for UTI for 10 days   FLUTICASONE (FLONASE) 50 MCG/ACT NASAL SPRAY    Place 2 sprays into both nostrils at bedtime.   FLUTICASONE-SALMETEROL (ADVAIR) 250-50 MCG/DOSE AEPB    Inhale 1 puff into the lungs 2 (two) times daily. For COPD   GABAPENTIN (NEURONTIN) 100 MG CAPSULE    Take 1 capsule (100 mg total) by mouth 3 (three) times daily.   GEMFIBROZIL (LOPID) 600 MG TABLET    Take 600 mg by mouth 2 (two) times daily before a meal.    GUAIFENESIN (MUCINEX MAXIMUM STRENGTH) 1200 MG TB12    Take by mouth 2 (two) times daily.   INSULIN GLARGINE (LANTUS) 100 UNIT/ML INJECTION    Inject 20 Units into the skin at bedtime.    LIDOCAINE (LIDODERM) 5 %    Apply two patches to lower back daily for pain. Remove & Discard patch after 12 hours.   LINAGLIPTIN (TRADJENTA) 5 MG TABS TABLET    Take 5 mg by mouth daily.   MEMANTINE HCL-DONEPEZIL HCL (NAMZARIC) 28-10 MG CP24    Take 1 capsule by mouth every evening. For dementia    PRAVASTATIN (PRAVACHOL) 40 MG TABLET    Take 40 mg by mouth every evening.    RANITIDINE (ZANTAC) 150 MG TABLET    Take 150 mg by mouth 2 (two) times daily. In the morning and at bedtime For reflux   SACCHAROMYCES BOULARDII (FLORASTOR) 250 MG CAPSULE    Take 250 mg by mouth 2 (two) times daily.   SKIN PROTECTANTS, MISC. (CALAZIME SKIN PROTECTANT EX)    Apply to buttocks every day and night shift for skin integrity   TIOTROPIUM (SPIRIVA) 18 MCG INHALATION CAPSULE    Place 18 mcg into inhaler and inhale daily.   TRAZODONE (DESYREL) 25 MG TABS TABLET    Take 25 mg by mouth at bedtime.   Modified Medications   No medications on file  Discontinued Medications   No medications on file     SIGNIFICANT DIAGNOSTIC EXAMS   LABS REVIEWED:    05-21-16: wbc 9.4; hgb 13.0; hct 39.9 ;mcv 86.5; plt 355; glucose 200; bun 13.0; creat 0.61; k+ 4.4 ;na++ 138; liver normal albumin 4.3; hgb a1c 9.0 chol 158; ldl 90; trig 81; hdl 52  06-25-16: urine micro-albumin: 1.2  09-21-16: hgb a1c 8.6 09-25-16: hgb a1c 8.4 12-26-16: wbc 10.2; hgb 13; hct 39; plt 327; glucose 112; bun 14; creat 0.5; k+ 4.1; nq++ 141; liver normal hgb a1c 7.3     Review of Systems Has dysuria  Constitutional: Negative for appetite change and fatigue.  HENT no complaints of congestion no headaches Respiratory: no shortness of breath no cough  Cardiovascular: Negative for chest pain, palpitations and leg swelling.  Gastrointestinal: Negative for nausea, diarrhea and constipation. has abdominal pain  Musculoskeletal: Negative for myalgias and arthralgias.  Skin: Negative for pallor.  Neurological: Negative for dizziness.  Psychiatric/Behavioral: The patient is not nervous/anxious.       Physical Exam Constitutional: She is oriented to person, place, and time. No distress.  Overweight    Eyes: Conjunctivae are normal.  Neck: Neck supple. No JVD present. No thyromegaly present.  Cardiovascular: Normal rate, regular rhythm and  intact distal pulses.   Respiratory: lung sounds are clear no wheezing  GI: Soft. Bowel sounds are normal. She exhibits no distension. Has suprapubic tenderness   Musculoskeletal: She exhibits no edema.  Able to move all extremities   Lymphadenopathy:  She has no cervical adenopathy.  Neurological: She is alert and oriented to person, place, and time.  Skin: Skin is warm and dry. She is not diaphoretic.  Psychiatric: She has a normal mood and affect.     ASSESSMENT/ PLAN:  1. UTI: will collect UA/C&S; will then begin macrobid 100 mg twice daily for one week with florastor; pending her culture reports.    MD is aware of resident's narcotic use and is in agreement with current plan of care. We will attempt to wean resident as apropriate   Ok Edwards NP [facility]  Contact [phone] Monday through Friday 8am- 5pm  After hours call [phone]

## 2017-01-04 DIAGNOSIS — I10 Essential (primary) hypertension: Secondary | ICD-10-CM

## 2017-01-04 DIAGNOSIS — I152 Hypertension secondary to endocrine disorders: Secondary | ICD-10-CM | POA: Insufficient documentation

## 2017-01-04 DIAGNOSIS — E1159 Type 2 diabetes mellitus with other circulatory complications: Secondary | ICD-10-CM | POA: Insufficient documentation

## 2017-01-06 ENCOUNTER — Non-Acute Institutional Stay (SKILLED_NURSING_FACILITY): Payer: Medicare Other | Admitting: Adult Health

## 2017-01-06 ENCOUNTER — Encounter: Payer: Self-pay | Admitting: Adult Health

## 2017-01-06 DIAGNOSIS — B9629 Other Escherichia coli [E. coli] as the cause of diseases classified elsewhere: Secondary | ICD-10-CM

## 2017-01-06 DIAGNOSIS — Z1612 Extended spectrum beta lactamase (ESBL) resistance: Secondary | ICD-10-CM

## 2017-01-06 DIAGNOSIS — N390 Urinary tract infection, site not specified: Secondary | ICD-10-CM | POA: Diagnosis not present

## 2017-01-06 NOTE — Progress Notes (Signed)
Location:   [facility] Room Number: 400Q Place of Service:  SNF (31)   CODE STATUS: full code   Allergies  Allergen Reactions  . Avelox [Moxifloxacin Hcl In Nacl] Hives  . Codeine     Reports her throat swelled in the past but has tolerated since.  . Compazine [Prochlorperazine Edisylate] Nausea And Vomiting  . Morphine And Related     Patient doesn't recall  . Mycobacterium   . Penicillins     Patient doesn't recall.  . Phenothiazines     Patient doesn't recall  . Prednisone     Insomnia, confusion.  Marland Kitchen Reglan [Metoclopramide]     Patient doesn't recall "but it was awful"  . Sulfonamide Derivatives Hives    Chief Complaint  Patient presents with  . Acute Visit    UTI    HPI:  She is currently on IV abt for her ESBL uti. She has pulled out her picc line for the third time. Will not reinsert. I have spoken with Dr. Eulas Post and will change her abt to po. There are reports of fever present. She tells me that she feels good.    Past Medical History:  Diagnosis Date  . Abnormality of gait   . Anxiety state, unspecified   . Candidiasis of other urogenital sites   . Chronic airway obstruction, not elsewhere classified   . Chronic obstructive lung disease ([place])   . Depressive disorder, not elsewhere classified   . Esophageal reflux   . Hyperlipemia   . Lumbago   . Osteoporosis   . Spinal stenosis   . 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 Unspecified dementia without behavioral disturbance     Past Surgical History:  Procedure Laterality Date  . ABDOMINAL HYSTERECTOMY    . APPENDECTOMY    . CHOLECYSTECTOMY    . VERTEBROPLASTY      Social History   Social History  . Marital status: Divorced    Spouse name: N/A  . Number of children: N/A  . Years of education: N/A   Occupational History  . Not on file.   Social History Main Topics  . Smoking status: Former Research scientist (life sciences)  . Smokeless tobacco: Never Used  . Alcohol use  No  . Drug use: No  . Sexual activity: No   Other Topics Concern  . Not on file   Social History Narrative  . No narrative on file   History reviewed. No pertinent family history.    VITAL SIGNS BP 134/70   Pulse 76   Temp 97.1 F (36.2 C)   Resp 18   Ht 5\' 2"  (1.575 m)   Wt 186 lb 9.6 oz (84.6 kg)   SpO2 99%   BMI 34.13 kg/m   Patient's Medications  New Prescriptions   No medications on file  Previous Medications   ACETAMINOPHEN (TYLENOL) 325 MG TABLET    Take 325 mg by mouth every 4 (four) hours as needed (pain).    ALBUTEROL (PROVENTIL) (2.5 MG/3ML) 0.083% NEBULIZER SOLUTION    Take 2.5 mg by nebulization every 6 (six) hours as needed for wheezing or shortness of breath.   ASPIRIN EC 81 MG TABLET    Take 1 tablet (81 mg total) by mouth daily.   CHOLECALCIFEROL 1000 UNITS TABLET    Take 1,000 Units by mouth at bedtime.   DULOXETINE (CYMBALTA) 60 MG CAPSULE    Take 60 mg by mouth daily. For depression  ERTAPENEM [facility]) IVPB    Inject into the vein. Use 1 gram IV in rhe afternoon for UTI for 10 days   FLUTICASONE (FLONASE) 50 MCG/ACT NASAL SPRAY    Place 2 sprays into both nostrils at bedtime.   FLUTICASONE-SALMETEROL (ADVAIR) 250-50 MCG/DOSE AEPB    Inhale 1 puff into the lungs 2 (two) times daily. For COPD   GABAPENTIN (NEURONTIN) 100 MG CAPSULE    Take 1 capsule (100 mg total) by mouth 3 (three) times daily.   GEMFIBROZIL (LOPID) 600 MG TABLET    Take 600 mg by mouth 2 (two) times daily before a meal.    GUAIFENESIN (MUCINEX MAXIMUM STRENGTH) 1200 MG TB12    Take by mouth 2 (two) times daily.   INSULIN GLARGINE (LANTUS) 100 UNIT/ML INJECTION    Inject 20 Units into the skin at bedtime.    LIDOCAINE (LIDODERM) 5 %    Apply two patches to lower back daily for pain. Remove & Discard patch after 12 hours.   LINAGLIPTIN (TRADJENTA) 5 MG TABS TABLET    Take 5 mg by mouth daily.   MEMANTINE HCL-DONEPEZIL HCL (NAMZARIC) 28-10 MG CP24    Take 1 capsule by mouth every evening.  For dementia   PRAVASTATIN (PRAVACHOL) 40 MG TABLET    Take 40 mg by mouth every evening.    RANITIDINE (ZANTAC) 150 MG TABLET    Take 150 mg by mouth 2 (two) times daily. In the morning and at bedtime For reflux   SACCHAROMYCES BOULARDII (FLORASTOR) 250 MG CAPSULE    Take 250 mg by mouth 2 (two) times daily.   SKIN PROTECTANTS, MISC. (CALAZIME SKIN PROTECTANT EX)    Apply to buttocks every day and night shift for skin integrity   TIOTROPIUM (SPIRIVA) 18 MCG INHALATION CAPSULE    Place 18 mcg into inhaler and inhale daily.   TRAZODONE (DESYREL) 25 MG TABS TABLET    Take 25 mg by mouth at bedtime.   Modified Medications   No medications on file  Discontinued Medications   No medications on file     SIGNIFICANT DIAGNOSTIC EXAMS    LABS REVIEWED:    05-21-16: wbc 9.4; hgb 13.0; hct 39.9 ;mcv 86.5; plt 355; glucose 200; bun 13.0; creat 0.61; k+ 4.4 ;na++ 138; liver normal albumin 4.3; hgb a1c 9.0 chol 158; ldl 90; trig 81; hdl 52  06-25-16: urine micro-albumin: 1.2  09-21-16: hgb a1c 8.6 09-25-16: hgb a1c 8.4 12-26-16: wbc 10.2; hgb 13; hct 39; plt 327; glucose 112; bun 14; creat 0.5; k+ 4.1; nq++ 141; liver normal hgb a1c 7.3  12-29-16: urine culture: ESBL e-coli    Review of Systems Constitutional: Negative for appetite change and fatigue.  HENT no complaints of congestion no headaches Respiratory: no shortness of breath no cough  Cardiovascular: Negative for chest pain, palpitations and leg swelling.  Gastrointestinal: Negative for nausea, diarrhea and constipation.  Musculoskeletal: Negative for myalgias and arthralgias.  Skin: Negative for pallor.  Neurological: Negative for dizziness.  Psychiatric/Behavioral: The patient is not nervous/anxious.       Physical Exam Constitutional: She is oriented to person, place, and time. No distress.  Overweight    Eyes: Conjunctivae are normal.  Neck: Neck supple. No JVD present. No thyromegaly present.  Cardiovascular: Normal rate,  regular rhythm and intact distal pulses.   Respiratory: lung sounds are clear no wheezing  GI: Soft. Bowel sounds are normal. She exhibits no distension. Has suprapubic tenderness   Musculoskeletal: She exhibits no edema.  Able to move all extremities   Lymphadenopathy:    She has no cervical adenopathy.  Neurological: She is alert and oriented to person, place, and time.  Skin: Skin is warm and dry. She is not diaphoretic.  Psychiatric: She has a normal mood and affect.     ASSESSMENT/ PLAN:  1. UTI: ESBL e-coli: will give her fosfomycin 3 gm one time and will monitor her status    MD is aware of resident's narcotic use and is in agreement with current plan of care. We will attempt to wean resident as apropriate   Ok Edwards NP [facility]  Contact [phone] Monday through Friday 8am- 5pm  After hours call [phone]

## 2017-01-12 DIAGNOSIS — N390 Urinary tract infection, site not specified: Secondary | ICD-10-CM | POA: Insufficient documentation

## 2017-01-18 DIAGNOSIS — Z1612 Extended spectrum beta lactamase (ESBL) resistance: Principal | ICD-10-CM

## 2017-01-18 DIAGNOSIS — N390 Urinary tract infection, site not specified: Principal | ICD-10-CM

## 2017-01-18 DIAGNOSIS — B9629 Other Escherichia coli [E. coli] as the cause of diseases classified elsewhere: Secondary | ICD-10-CM | POA: Insufficient documentation

## 2017-01-26 ENCOUNTER — Encounter: Payer: Self-pay | Admitting: Internal Medicine

## 2017-01-26 NOTE — Progress Notes (Signed)
Opened in error

## 2017-01-28 ENCOUNTER — Non-Acute Institutional Stay (SKILLED_NURSING_FACILITY): Payer: Medicare Other | Admitting: Internal Medicine

## 2017-01-28 ENCOUNTER — Encounter: Payer: Self-pay | Admitting: Internal Medicine

## 2017-01-28 DIAGNOSIS — M545 Low back pain, unspecified: Secondary | ICD-10-CM

## 2017-01-28 DIAGNOSIS — I10 Essential (primary) hypertension: Secondary | ICD-10-CM

## 2017-01-28 DIAGNOSIS — F22 Delusional disorders: Secondary | ICD-10-CM

## 2017-01-28 DIAGNOSIS — G8929 Other chronic pain: Secondary | ICD-10-CM | POA: Diagnosis not present

## 2017-01-28 DIAGNOSIS — J449 Chronic obstructive pulmonary disease, unspecified: Secondary | ICD-10-CM | POA: Diagnosis not present

## 2017-01-28 DIAGNOSIS — E1139 Type 2 diabetes mellitus with other diabetic ophthalmic complication: Secondary | ICD-10-CM

## 2017-01-28 DIAGNOSIS — F339 Major depressive disorder, recurrent, unspecified: Secondary | ICD-10-CM

## 2017-01-28 DIAGNOSIS — E1165 Type 2 diabetes mellitus with hyperglycemia: Secondary | ICD-10-CM

## 2017-01-28 DIAGNOSIS — F0151 Vascular dementia with behavioral disturbance: Secondary | ICD-10-CM

## 2017-01-28 DIAGNOSIS — I152 Hypertension secondary to endocrine disorders: Secondary | ICD-10-CM

## 2017-01-28 DIAGNOSIS — K219 Gastro-esophageal reflux disease without esophagitis: Secondary | ICD-10-CM

## 2017-01-28 DIAGNOSIS — F0152 Vascular dementia, unspecified severity, with psychotic disturbance: Secondary | ICD-10-CM

## 2017-01-28 DIAGNOSIS — E1159 Type 2 diabetes mellitus with other circulatory complications: Secondary | ICD-10-CM | POA: Diagnosis not present

## 2017-01-28 DIAGNOSIS — F0150 Vascular dementia without behavioral disturbance: Secondary | ICD-10-CM

## 2017-01-28 NOTE — Progress Notes (Signed)
Patient ID: [name] [name], female   DOB: 19-Apr-1941, 76 y.o.   MRN: [id number]    DATE:  01/28/2017  Location:    [facility] Room Number: 354 B Place of Service: SNF (31)   Extended Emergency Contact Information Primary Emergency Contact: Blanche East) Address: [address]          [name], [place] Phone: [phone] Relation: Son Secondary Emergency Contact: Charletta Cousin [place] Phone: [phone] Work Phone: [phone] Relation: Son  Advanced Directive information Does Patient Have a Medical Advance Directive?: Yes, Type of Advance Directive: Out of facility DNR (pink MOST or yellow form), Pre-existing out of facility DNR order (yellow form or pink MOST form): Yellow form placed in chart (order not valid for inpatient use);Pink MOST form placed in chart (order not valid for inpatient use), Does patient want to make changes to medical advance directive?: No - Patient declined  Chief Complaint  Patient presents with  . Medical Management of Chronic Issues    Routine Visit    HPI:  76 yo female long term resident seen today for f/u. She c/o severe indigestion. No N/V. No abdominal pain, f/c. Appetite reduced. Sleeps well. No recent falls. She was tx for ESBL E coli UTI last month. She is a poor historian due to dementia. Hx obtained from chart.  COPD - stable on advair 250/50 twice daily; mucinex 1200 mg  twice daily; spiriva 18 mcg daily   Dyslipidemia - stable on pravachol  40 mg daily; lopid 600 mg twice daily. LDL 90; TG 52   Hypokalemia - stable on KCl 40 meq daily  Vascular dementia - stable on namzaric 28-10 mg daily  GERD - suboptimally controlled on zantac 150 mg twice daily   Chronic low back pain - stable on lidoderm 2 patches to lower back daily; neurontin 100 mg three times daily; cymbalta 60 mg daily.   Anxiety/MDD - stable on cymbalta 60 mg daily;  trazodone 25 mg nightly for  sleep.   DM - uncontrolled. A1c 8.4%.  She takes tradjenta 5  mg daily; lantus 20 units nightly    Hypertension - BP stable on lisinopril 2.5 mg daily and asa 81 mg daily        Past Medical History:  Diagnosis Date  . Abnormality of gait   . Anxiety state, unspecified   . Candidiasis of other urogenital sites   . Chronic airway obstruction, not elsewhere classified   . Chronic obstructive lung disease ([place])   . Depressive disorder, not elsewhere classified   . Esophageal reflux   . Hyperlipemia   . Lumbago   . Osteoporosis   . Spinal stenosis   . 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 Unspecified dementia without behavioral disturbance     Past Surgical History:  Procedure Laterality Date  . ABDOMINAL HYSTERECTOMY    . APPENDECTOMY    . CHOLECYSTECTOMY    . VERTEBROPLASTY      Patient Care Team: Gildardo Cranker, DO as PCP - General (Internal Medicine) Nyoka Cowden Phylis Bougie, NP as Nurse Practitioner ([place], [place] ([place])  Social History   Social History  . Marital status: Divorced    Spouse name: N/A  . Number of children: N/A  . Years of education: N/A   Occupational History  . Not on file.   Social History Main Topics  . Smoking status: Former Research scientist (life sciences)  .  Smokeless tobacco: Never Used  . Alcohol use No  . Drug use: No  . Sexual activity: No   Other Topics Concern  . Not on file   Social History Narrative  . No narrative on file     reports that she has quit smoking. She has never used smokeless tobacco. She reports that she does not drink alcohol or use drugs.  History reviewed. No pertinent family history. No family status information on file.    Immunization History  Administered Date(s) Administered  . Influenza Whole 05/22/2013  . Influenza-Unspecified 05/23/2014, 05/20/2015, 06/27/2016  . PPD Test 05/01/2016, 05/08/2016  . Pneumococcal-Unspecified 08/31/2011     Allergies  Allergen Reactions  . Avelox [Moxifloxacin Hcl In Nacl] Hives  . Codeine     Reports her throat swelled in the past but has tolerated since.  . Compazine [Prochlorperazine Edisylate] Nausea And Vomiting  . Morphine And Related     Patient doesn't recall  . Mycobacterium   . Penicillins     Patient doesn't recall.  . Phenothiazines     Patient doesn't recall  . Prednisone     Insomnia, confusion.  Marland Kitchen Reglan [Metoclopramide]     Patient doesn't recall "but it was awful"  . Sulfonamide Derivatives Hives    Medications: Patient's Medications  New Prescriptions   No medications on file  Previous Medications   ACETAMINOPHEN (TYLENOL) 325 MG TABLET    Take 325 mg by mouth every 4 (four) hours as needed (pain).    ALBUTEROL (PROVENTIL) (2.5 MG/3ML) 0.083% NEBULIZER SOLUTION    Take 2.5 mg by nebulization every 6 (six) hours as needed for wheezing or shortness of breath.   ASPIRIN EC 81 MG TABLET    Take 1 tablet (81 mg total) by mouth daily.   CHOLECALCIFEROL 1000 UNITS TABLET    Take 1,000 Units by mouth at bedtime.   DULOXETINE (CYMBALTA) 60 MG CAPSULE    Take 60 mg by mouth daily. For depression   FLUTICASONE (FLONASE) 50 MCG/ACT NASAL SPRAY    Place 2 sprays into both nostrils at bedtime.   FLUTICASONE-SALMETEROL (ADVAIR) 250-50 MCG/DOSE AEPB    Inhale 1 puff into the lungs 2 (two) times daily. For COPD   GABAPENTIN (NEURONTIN) 100 MG CAPSULE    Take 1 capsule (100 mg total) by mouth 3 (three) times daily.   GEMFIBROZIL (LOPID) 600 MG TABLET    Take 600 mg by mouth 2 (two) times daily before a meal.    GUAIFENESIN (MUCINEX MAXIMUM STRENGTH) 1200 MG TB12    Take by mouth 2 (two) times daily.   INSULIN GLARGINE (LANTUS) 100 UNIT/ML INJECTION    Inject 20 Units into the skin at bedtime.    LINAGLIPTIN (TRADJENTA) 5 MG TABS TABLET    Take 5 mg by mouth daily.   MEMANTINE HCL-DONEPEZIL HCL (NAMZARIC) 28-10 MG CP24    Take 1 capsule by mouth every evening. For dementia    PRAVASTATIN (PRAVACHOL) 40 MG TABLET    Take 40 mg by mouth every evening.    RANITIDINE (ZANTAC) 150 MG TABLET    Take 150 mg by mouth 2 (two) times daily. In the morning and at bedtime For reflux   SKIN PROTECTANTS, MISC. (CALAZIME SKIN PROTECTANT EX)    Apply to buttocks every day and night shift for skin integrity   TRAZODONE (DESYREL) 25 MG TABS TABLET    Take 25 mg by mouth at bedtime.    UMECLIDINIUM BROMIDE (INCRUSE ELLIPTA) 62.5 MCG/INH AEPB  Inhale 2 puffs into the lungs daily.  Modified Medications   No medications on file  Discontinued Medications   No medications on file    Review of Systems  Unable to perform ROS: Dementia    Vitals:   01/28/17 1014  BP: 108/62  Pulse: 76  Resp: 16  Temp: 97.7 F (36.5 C)  TempSrc: Oral  SpO2: 97%  Weight: 184 lb 6.4 oz (83.6 kg)  Height: 5\' 2"  (1.575 m)   Body mass index is 33.73 kg/m.  Physical Exam  Constitutional: She appears well-developed.  Lying in bed in NAD, frail appearing  HENT:  Mouth/Throat: Oropharynx is clear and moist. No oropharyngeal exudate.  MMM; no oral thrush  Eyes: Pupils are equal, round, and reactive to light. No scleral icterus.  Neck: Neck supple. Carotid bruit is not present. No tracheal deviation present. No thyromegaly present.  Cardiovascular: Normal rate, regular rhythm and intact distal pulses.  Exam reveals no gallop and no friction rub.   Murmur heard. No LE edema b/l. no calf TTP.   Pulmonary/Chest: Effort normal. No stridor. No respiratory distress. She has no wheezes. Rales: 1/6 SEM.  Abdominal: Soft. Normal appearance and bowel sounds are normal. She exhibits distension (feels full). She exhibits no mass. There is no hepatomegaly. There is tenderness (epigastric; LLQ). There is no rigidity, no rebound and no guarding. No hernia.  Musculoskeletal: She exhibits edema.  Lymphadenopathy:    She has no cervical adenopathy.  Neurological: She is alert.  Skin: Skin is warm and dry. No rash  noted.  Psychiatric: She has a normal mood and affect. Her behavior is normal.     Labs reviewed: Abstract on 12/29/2016  Component Date Value Ref Range Status  . Hemoglobin 12/26/2016 13.0  12.0 - 16.0 g/dL Final  . HCT 12/26/2016 39  36 - 46 % Final  . Neutrophils Absolute 12/26/2016 7  /L Final  . Platelets 12/26/2016 327  150 - 399 K/L Final  . WBC 12/26/2016 10.2  10^3/mL Final  . Glucose 12/26/2016 112  mg/dL Final  . BUN 12/26/2016 14  4 - 21 mg/dL Final  . Creatinine 12/26/2016 0.5  0.5 - 1.1 mg/dL Final  . Potassium 12/26/2016 4.1  3.4 - 5.3 mmol/L Final  . Sodium 12/26/2016 141  137 - 147 mmol/L Final  . Alkaline Phosphatase 12/26/2016 85  25 - 125 U/L Final  . ALT 12/26/2016 9  7 - 35 U/L Final  . AST 12/26/2016 9* 13 - 35 U/L Final  . Bilirubin, Total 12/26/2016 0.2  mg/dL Final  . Hemoglobin A1C 12/26/2016 7.3   Final  Nursing Home on 11/04/2016  Component Date Value Ref Range Status  . Hemoglobin 11/18/2016 13.1  12.0 - 16.0 g/dL Final  . HCT 11/18/2016 41  36 - 46 % Final  . Neutrophils Absolute 11/18/2016 7  /L Final  . Platelets 11/18/2016 305  150 - 399 K/L Final  . WBC 11/18/2016 10.3  10^3/mL Final  . Glucose 11/18/2016 125  mg/dL Final  . BUN 11/18/2016 13  4 - 21 mg/dL Final  . Creatinine 11/18/2016 0.5  0.5 - 1.1 mg/dL Final  . Potassium 11/18/2016 3.9  3.4 - 5.3 mmol/L Final  . Sodium 11/18/2016 145  137 - 147 mmol/L Final  . Alkaline Phosphatase 11/18/2016 96  25 - 125 U/L Final  . ALT 11/18/2016 5* 7 - 35 U/L Final   <  . AST 11/18/2016 8* 13 - 35 U/L Final  .  Bilirubin, Total 11/18/2016 0.3  mg/dL Final    No results found.   Assessment/Plan   ICD-10-CM   1. Gastroesophageal reflux disease without esophagitis - uncontrolled K21.9   2. 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 Hypertension associated with diabetes ([place]) E11.59    I10   4. Episode of recurrent major depressive  disorder, unspecified depression episode severity ([place]) F33.9   5. Chronic low back pain without sciatica, unspecified back pain laterality M54.5    G89.29   6. Vascular dementia with paranoia ([place]) F01.51    F22   7. Chronic obstructive pulmonary disease, unspecified COPD type (HCC) J44.9      Add maalox 30 cc po BID prn indigestion. Cont zantac as ordered  Cont other meds as ordered  PT/Ot as indicated  Will follow  Weslynn Ke S. Perlie Gold  [facility] [address] [place], [place] [address] [phone] Cell (Monday-Friday 8 AM - 5 PM) [phone] After 5 PM and follow prompts

## 2017-02-24 ENCOUNTER — Non-Acute Institutional Stay (SKILLED_NURSING_FACILITY): Payer: Medicare Other | Admitting: Adult Health

## 2017-02-24 ENCOUNTER — Encounter: Payer: Self-pay | Admitting: Adult Health

## 2017-02-24 DIAGNOSIS — I10 Essential (primary) hypertension: Secondary | ICD-10-CM | POA: Diagnosis not present

## 2017-02-24 DIAGNOSIS — E1169 Type 2 diabetes mellitus with other specified complication: Secondary | ICD-10-CM | POA: Diagnosis not present

## 2017-02-24 DIAGNOSIS — E1139 Type 2 diabetes mellitus with other diabetic ophthalmic complication: Secondary | ICD-10-CM | POA: Diagnosis not present

## 2017-02-24 DIAGNOSIS — K219 Gastro-esophageal reflux disease without esophagitis: Secondary | ICD-10-CM | POA: Diagnosis not present

## 2017-02-24 DIAGNOSIS — F0151 Vascular dementia with behavioral disturbance: Secondary | ICD-10-CM

## 2017-02-24 DIAGNOSIS — G8929 Other chronic pain: Secondary | ICD-10-CM

## 2017-02-24 DIAGNOSIS — M545 Low back pain, unspecified: Secondary | ICD-10-CM

## 2017-02-24 DIAGNOSIS — F339 Major depressive disorder, recurrent, unspecified: Secondary | ICD-10-CM | POA: Diagnosis not present

## 2017-02-24 DIAGNOSIS — E1165 Type 2 diabetes mellitus with hyperglycemia: Secondary | ICD-10-CM | POA: Diagnosis not present

## 2017-02-24 DIAGNOSIS — F22 Delusional disorders: Secondary | ICD-10-CM | POA: Diagnosis not present

## 2017-02-24 DIAGNOSIS — E1159 Type 2 diabetes mellitus with other circulatory complications: Secondary | ICD-10-CM | POA: Diagnosis not present

## 2017-02-24 DIAGNOSIS — J441 Chronic obstructive pulmonary disease with (acute) exacerbation: Secondary | ICD-10-CM | POA: Diagnosis not present

## 2017-02-24 DIAGNOSIS — F0150 Vascular dementia without behavioral disturbance: Secondary | ICD-10-CM

## 2017-02-24 DIAGNOSIS — E785 Hyperlipidemia, unspecified: Secondary | ICD-10-CM | POA: Diagnosis not present

## 2017-02-24 DIAGNOSIS — F0152 Vascular dementia, unspecified severity, with psychotic disturbance: Secondary | ICD-10-CM

## 2017-02-24 DIAGNOSIS — I152 Hypertension secondary to endocrine disorders: Secondary | ICD-10-CM

## 2017-02-24 NOTE — Progress Notes (Addendum)
Provider:  Ok Edwards, NP Location:  [facility] Room Number: 80 B Place of Service:  SNF ([id number])   PCP: Gildardo Cranker, DO Patient Care Team: Gildardo Cranker, DO as PCP - General (Internal Medicine) Nyoka Cowden Phylis Bougie, NP as Nurse Practitioner ([facility] [place] ([place])  Extended Emergency Contact Information Primary Emergency Contact: [name]) Address: [place] [address]          [name] Phone: [phone] Relation: Son Secondary Emergency Contact: Charletta Cousin [place] Phone: [phone] Work Phone: [phone] Relation: Son  Code Status: Full Code Goals of Care: Advanced Directive information Advanced Directives 02/24/2017  Does Patient Have a Medical Advance Directive? Yes  Type of Advance Directive Out of facility DNR (pink MOST or yellow form)  Does patient want to make changes to medical advance directive? No - Patient declined  Copy of [place] in Chart? -  Would patient like information on creating a medical advance directive? -  Pre-existing out of facility DNR order (yellow form or pink MOST form) -      Allergies  Allergen Reactions  . Avelox [Moxifloxacin Hcl In Nacl] Hives  . Codeine     Reports her throat swelled in the past but has tolerated since.  . Compazine [Prochlorperazine Edisylate] Nausea And Vomiting  . Morphine And Related     Patient doesn't recall  . Mycobacterium   . Penicillins     Patient doesn't recall.  . Phenothiazines     Patient doesn't recall  . Prednisone     Insomnia, confusion.  Marland Kitchen Reglan [Metoclopramide]     Patient doesn't recall "but it was awful"  . Sulfonamide Derivatives Hives     Chief Complaint  Patient presents with  . Annual Exam    Yearly exam    HPI: Patient is a 76 y.o. female seen today for an annual comprehensive examination. She has not been  hospitalized over the past year. She has been treated for an uti in May of this year without complications. She tells me that she is feeling good; denies any pain. She rarely gets out of bed. Her appetite is good. There are no reports of any behavioral issues. There are no nursing concerns at this time. She is not a candidate for dexa; colonoscopy; or mammogram as she is unwilling to leave facility.    Past Medical History:  Diagnosis Date  . Abnormality of gait   . Anxiety state, unspecified   . Candidiasis of other urogenital sites   . Chronic airway obstruction, not elsewhere classified   . Chronic obstructive lung disease ([place])   . Depressive disorder, not elsewhere classified   . Esophageal reflux   . Hyperlipemia   . Lumbago   . Osteoporosis   . Spinal stenosis   . 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 Unspecified dementia without behavioral disturbance    Past Surgical History:  Procedure Laterality Date  . ABDOMINAL HYSTERECTOMY    . APPENDECTOMY    . CHOLECYSTECTOMY    . VERTEBROPLASTY      reports that she has quit smoking. She has never used smokeless tobacco. She reports that she does not drink alcohol or use drugs. Social History   Social History  . Marital status: Divorced    Spouse name: N/A  . Number of children: N/A  . Years of education: N/A   Occupational History  .  Not on file.   Social History Main Topics  . Smoking status: Former Research scientist (life sciences)  . Smokeless tobacco: Never Used  . Alcohol use No  . Drug use: No  . Sexual activity: No   Other Topics Concern  . Not on file   Social History Narrative  . No narrative on file   History reviewed. No pertinent family history.  Vitals:   02/24/17 1012  BP: (!) 149/58  Pulse: 75  Resp: 18  Temp: 97.8 F (36.6 C)  SpO2: 93%  Weight: 179 lb 6.4 oz (81.4 kg)  Height: 5\' 2"  (1.575 m)   Body mass index is 32.81 kg/m.  Outpatient Encounter Prescriptions as of 02/24/2017    Medication Sig  . acetaminophen (TYLENOL) 325 MG tablet Take 325 mg by mouth every 4 (four) hours as needed (pain).   Marland Kitchen albuterol (PROVENTIL) (2.5 MG/3ML) 0.083% nebulizer solution Take 2.5 mg by nebulization every 6 (six) hours as needed for wheezing or shortness of breath.  Marland Kitchen aluminum-magnesium hydroxide-simethicone (MAALOX) 295-188-41 MG/5ML SUSP Take 30 mLs by mouth every 12 (twelve) hours as needed.  Marland Kitchen aspirin EC 81 MG tablet Take 1 tablet (81 mg total) by mouth daily.  . Cholecalciferol 1000 units tablet Take 2,000 Units by mouth at bedtime.   . DULoxetine (CYMBALTA) 60 MG capsule Take 60 mg by mouth daily. For depression  . fluticasone (FLONASE) 50 MCG/ACT nasal spray Place 2 sprays into both nostrils at bedtime.  . Fluticasone-Salmeterol (ADVAIR) 250-50 MCG/DOSE AEPB Inhale 1 puff into the lungs 2 (two) times daily. For COPD  . gabapentin (NEURONTIN) 100 MG capsule Take 1 capsule (100 mg total) by mouth 3 (three) times daily.  Marland Kitchen gemfibrozil (LOPID) 600 MG tablet Take 600 mg by mouth 2 (two) times daily before a meal.   . Guaifenesin (MUCINEX MAXIMUM STRENGTH) 1200 MG TB12 Take 1 tablet by mouth 2 (two) times daily.   . insulin glargine (LANTUS) 100 UNIT/ML injection Inject 20 Units into the skin at bedtime.   Marland Kitchen linagliptin (TRADJENTA) 5 MG TABS tablet Take 5 mg by mouth daily.  . Memantine HCl-Donepezil HCl (NAMZARIC) 28-10 MG CP24 Take 1 capsule by mouth every evening. For dementia  . pravastatin (PRAVACHOL) 40 MG tablet Take 40 mg by mouth every evening.   . ranitidine (ZANTAC) 150 MG tablet Take 150 mg by mouth 2 (two) times daily. In the morning and at bedtime For reflux  . Skin Protectants, Misc. (CALAZIME SKIN PROTECTANT EX) Apply to buttocks every day and night shift for skin integrity  . traZODone (DESYREL) 25 mg TABS tablet Take 25 mg by mouth at bedtime.   Marland Kitchen umeclidinium bromide (INCRUSE ELLIPTA) 62.5 MCG/INH AEPB Inhale 2 puffs into the lungs daily.   No  facility-administered encounter medications on file as of 02/24/2017.     SIGNIFICANT DIAGNOSTIC EXAMS  LABS REVIEWED:    05-21-16: wbc 9.4; hgb 13.0; hct 39.9 ;mcv 86.5; plt 355; glucose 200; bun 13.0; creat 0.61; k+ 4.4 ;na++ 138; liver normal albumin 4.3; hgb a1c 9.0 chol 158; ldl 90; trig 81; hdl 52  06-25-16: urine micro-albumin: 1.2  09-21-16: hgb a1c 8.6 09-25-16: hgb a1c 8.4 12-26-16: wbc 10.2; hgb 13; hct 39; mcv 87.0 plt 327; glucose 112; bun 14; creat 0.5; k+ 4.1; na++ 141; liver normal hgb a1c 7.3  12-29-16: urine culture: ESBL e-coli    Review of Systems  Constitutional: Negative for malaise/fatigue.  HENT: Negative for congestion.   Eyes: Negative for pain.  Respiratory: Negative for  cough and shortness of breath.   Cardiovascular: Negative for chest pain, palpitations and leg swelling.  Gastrointestinal: Negative for abdominal pain, constipation and heartburn.  Genitourinary: Negative for dysuria.  Musculoskeletal: Negative for back pain, joint pain and myalgias.  Skin: Negative.   Neurological: Negative for dizziness.  Psychiatric/Behavioral: The patient is not nervous/anxious.    Physical Exam  Constitutional: No distress.  Overweight   Eyes: Conjunctivae are normal.  Neck: Neck supple. No JVD present. No thyromegaly present.  Cardiovascular: Normal rate, regular rhythm and intact distal pulses.   Respiratory: Effort normal and breath sounds normal. No respiratory distress. She has no wheezes.  GI: Soft. Bowel sounds are normal. She exhibits no distension. There is no tenderness.  Musculoskeletal: She exhibits no edema.  Able to move all extremities   Lymphadenopathy:    She has no cervical adenopathy.  Neurological: She is alert.  Skin: Skin is warm and dry. She is not diaphoretic.  Psychiatric: She has a normal mood and affect.  Is calm    ASSESSMENT/ PLAN:   1. COPD:  will continue advair 250/50 twice daily;mucinex 1200 mg  twice daily spiriva 18 mcg  daily   2. Dyslipidemia: will continue pravachol  40 mg daily will continue lopid 600 mg twice daily ldl is 90; trig 52   3. Hypokalemia: will continue k+ 40 meq daily  4. Vascular dementia: without change in her status; will continue namzaric 28-10 mg daily and will monitor her weight is 179 pounds.   5. Gerd: will continue zantac 150 mg twice daily   6. Chronic low back pain: she is presently stable; will continue lidoderm 2 patches to lower back daily; neurontin 100 mg three times daily; cymbalta 60 mg daily  will monitor her status.   7. Anxiety: she is stable and is benefiting from cymbalta 60 mg daily will not make changes will continue trazodone 25 mg nightly for sleep.   8. Diabetes: her hgb a1c is 8.4.  Will continue  tradjenta 5  mg daily will change to lantus 20 units nightly  Is on asa statin; could not tolerate low does ace   9.  Hypertension: b/p 149/58  will continue asa 81 mg daily    Could not tolerate ACE at low dose will monitor    Her health maintenance is up to date She is unable to participate in mammogram; colonoscopy; or dexa scan   Time spent with patient  45  minutes >50% time spent counseling; reviewing medical record; tests; labs; and developing future plan of care   Ok Edwards NP [facility]  Contact [phone] Monday through Friday 8am- 5pm  After hours call [phone]

## 2017-03-08 ENCOUNTER — Non-Acute Institutional Stay (SKILLED_NURSING_FACILITY): Payer: Medicare Other

## 2017-03-08 DIAGNOSIS — Z0000 Encounter for general adult medical examination without abnormal findings: Secondary | ICD-10-CM | POA: Diagnosis not present

## 2017-03-08 NOTE — Patient Instructions (Addendum)
[name] , Thank you for taking time to come for your Medicare Wellness Visit. I appreciate your ongoing commitment to your health goals. Please review the following plan we discussed and let me know if I can assist you in the future.   Screening recommendations/referrals: Colonoscopy excluded, pt long term Mammogram excluded, pt long term Bone Density up to date Recommended yearly ophthalmology/optometry visit for glaucoma screening and checkup Recommended yearly dental visit for hygiene and checkup  Vaccinations: Influenza vaccine up to date. Due 2018 fall season Pneumococcal vaccine up to date Tdap vaccine up to date. Due 12/05/24 Shingles vaccine not in records  Advanced directives: Copies of health care power of attorney and living will are needed    Conditions/risks identified: None  Next appointment: Dr Eulas Post makes rounds   Preventive Care 75 Years and Older, Female Preventive care refers to lifestyle choices and visits with your health care provider that can promote health and wellness. What does preventive care include?  A yearly physical exam. This is also called an annual well check.  Dental exams once or twice a year.  Routine eye exams. Ask your health care provider how often you should have your eyes checked.  Personal lifestyle choices, including:  Daily care of your teeth and gums.  Regular physical activity.  Eating a healthy diet.  Avoiding tobacco and drug use.  Limiting alcohol use.  Practicing safe sex.  Taking low-dose aspirin every day.  Taking vitamin and mineral supplements as recommended by your health care provider. What happens during an annual well check? The services and screenings done by your health care provider during your annual well check will depend on your age, overall health, lifestyle risk factors, and family history of disease. Counseling  Your health care provider may ask you questions about your:  Alcohol  use.  Tobacco use.  Drug use.  Emotional well-being.  Home and relationship well-being.  Sexual activity.  Eating habits.  History of falls.  Memory and ability to understand (cognition).  Work and work Statistician.  Reproductive health. Screening  You may have the following tests or measurements:  Height, weight, and BMI.  Blood pressure.  Lipid and cholesterol levels. These may be checked every 5 years, or more frequently if you are over 19 years old.  Skin check.  Lung cancer screening. You may have this screening every year starting at age 25 if you have a 30-pack-year history of smoking and currently smoke or have quit within the past 15 years.  Fecal occult blood test (FOBT) of the stool. You may have this test every year starting at age 66.  Flexible sigmoidoscopy or colonoscopy. You may have a sigmoidoscopy every 5 years or a colonoscopy every 10 years starting at age 64.  Hepatitis C blood test.  Hepatitis B blood test.  Sexually transmitted disease (STD) testing.  Diabetes screening. This is done by checking your blood sugar (glucose) after you have not eaten for a while (fasting). You may have this done every 1-3 years.  Bone density scan. This is done to screen for osteoporosis. You may have this done starting at age 24.  Mammogram. This may be done every 1-2 years. Talk to your health care provider about how often you should have regular mammograms. Talk with your health care provider about your test results, treatment options, and if necessary, the need for more tests. Vaccines  Your health care provider may recommend certain vaccines, such as:  Influenza vaccine. This is recommended every  year.  Tetanus, diphtheria, and acellular pertussis (Tdap, Td) vaccine. You may need a Td booster every 10 years.  Zoster vaccine. You may need this after age 61.  Pneumococcal 13-valent conjugate (PCV13) vaccine. One dose is recommended after age  11.  Pneumococcal polysaccharide (PPSV23) vaccine. One dose is recommended after age 51. Talk to your health care provider about which screenings and vaccines you need and how often you need them. This information is not intended to replace advice given to you by your health care provider. Make sure you discuss any questions you have with your health care provider. Document Released: 08/23/2015 Document Revised: 04/15/2016 Document Reviewed: 05/28/2015 Elsevier Interactive Patient Education  2017 [place] Prevention in the Home Falls can cause injuries. They can happen to people of all ages. There are many things you can do to make your home safe and to help prevent falls. What can I do on the outside of my home?  Regularly fix the edges of walkways and driveways and fix any cracks.  Remove anything that might make you trip as you walk through a door, such as a raised step or threshold.  Trim any bushes or trees on the path to your home.  Use bright outdoor lighting.  Clear any walking paths of anything that might make someone trip, such as rocks or tools.  Regularly check to see if handrails are loose or broken. Make sure that both sides of any steps have handrails.  Any raised decks and porches should have guardrails on the edges.  Have any leaves, snow, or ice cleared regularly.  Use sand or salt on walking paths during winter.  Clean up any spills in your garage right away. This includes oil or grease spills. What can I do in the bathroom?  Use night lights.  Install grab bars by the toilet and in the tub and shower. Do not use towel bars as grab bars.  Use non-skid mats or decals in the tub or shower.  If you need to sit down in the shower, use a plastic, non-slip stool.  Keep the floor dry. Clean up any water that spills on the floor as soon as it happens.  Remove soap buildup in the tub or shower regularly.  Attach bath mats securely with double-sided  non-slip rug tape.  Do not have throw rugs and other things on the floor that can make you trip. What can I do in the bedroom?  Use night lights.  Make sure that you have a light by your bed that is easy to reach.  Do not use any sheets or blankets that are too big for your bed. They should not hang down onto the floor.  Have a firm chair that has side arms. You can use this for support while you get dressed.  Do not have throw rugs and other things on the floor that can make you trip. What can I do in the kitchen?  Clean up any spills right away.  Avoid walking on wet floors.  Keep items that you use a lot in easy-to-reach places.  If you need to reach something above you, use a strong step stool that has a grab bar.  Keep electrical cords out of the way.  Do not use floor polish or wax that makes floors slippery. If you must use wax, use non-skid floor wax.  Do not have throw rugs and other things on the floor that can make you trip. What can  I do with my stairs?  Do not leave any items on the stairs.  Make sure that there are handrails on both sides of the stairs and use them. Fix handrails that are broken or loose. Make sure that handrails are as long as the stairways.  Check any carpeting to make sure that it is firmly attached to the stairs. Fix any carpet that is loose or worn.  Avoid having throw rugs at the top or bottom of the stairs. If you do have throw rugs, attach them to the floor with carpet tape.  Make sure that you have a light switch at the top of the stairs and the bottom of the stairs. If you do not have them, ask someone to add them for you. What else can I do to help prevent falls?  Wear shoes that:  Do not have high heels.  Have rubber bottoms.  Are comfortable and fit you well.  Are closed at the toe. Do not wear sandals.  If you use a stepladder:  Make sure that it is fully opened. Do not climb a closed stepladder.  Make sure that both  sides of the stepladder are locked into place.  Ask someone to hold it for you, if possible.  Clearly mark and make sure that you can see:  Any grab bars or handrails.  First and last steps.  Where the edge of each step is.  Use tools that help you move around (mobility aids) if they are needed. These include:  Canes.  Walkers.  Scooters.  Crutches.  Turn on the lights when you go into a dark area. Replace any light bulbs as soon as they burn out.  Set up your furniture so you have a clear path. Avoid moving your furniture around.  If any of your floors are uneven, fix them.  If there are any pets around you, be aware of where they are.  Review your medicines with your doctor. Some medicines can make you feel dizzy. This can increase your chance of falling. Ask your doctor what other things that you can do to help prevent falls. This information is not intended to replace advice given to you by your health care provider. Make sure you discuss any questions you have with your health care provider. Document Released: 05/23/2009 Document Revised: 01/02/2016 Document Reviewed: 08/31/2014 Elsevier Interactive Patient Education  2017 [employer].

## 2017-03-08 NOTE — Progress Notes (Signed)
Subjective:   [name] [name] is a 76 y.o. female who presents for an Initial Medicare Annual Wellness Visit at [facility]       Objective:    Today's Vitals   03/08/17 1014  BP: (!) 140/52  Pulse: 68  Temp: 98 F (36.7 C)  TempSrc: Oral  SpO2: 95%  Weight: 179 lb (81.2 kg)  Height: 5\' 2"  (1.575 m)   Body mass index is 32.74 kg/m.   Current Medications (verified) Outpatient Encounter Prescriptions as of 03/08/2017  Medication Sig  . acetaminophen (TYLENOL) 325 MG tablet Take 325 mg by mouth every 4 (four) hours as needed (pain).   Marland Kitchen albuterol (PROVENTIL) (2.5 MG/3ML) 0.083% nebulizer solution Take 2.5 mg by nebulization every 6 (six) hours as needed for wheezing or shortness of breath.  Marland Kitchen aluminum-magnesium hydroxide-simethicone (MAALOX) 409-811-91 MG/5ML SUSP Take 30 mLs by mouth every 12 (twelve) hours as needed.  Marland Kitchen aspirin EC 81 MG tablet Take 1 tablet (81 mg total) by mouth daily.  . Cholecalciferol 1000 units tablet Take 2,000 Units by mouth at bedtime.   . DULoxetine (CYMBALTA) 60 MG capsule Take 60 mg by mouth daily. For depression  . fluticasone (FLONASE) 50 MCG/ACT nasal spray Place 2 sprays into both nostrils at bedtime.  . Fluticasone-Salmeterol (ADVAIR) 250-50 MCG/DOSE AEPB Inhale 1 puff into the lungs 2 (two) times daily. For COPD  . gabapentin (NEURONTIN) 100 MG capsule Take 1 capsule (100 mg total) by mouth 3 (three) times daily.  Marland Kitchen gemfibrozil (LOPID) 600 MG tablet Take 600 mg by mouth 2 (two) times daily before a meal.   . Guaifenesin (MUCINEX MAXIMUM STRENGTH) 1200 MG TB12 Take 1 tablet by mouth 2 (two) times daily.   . insulin glargine (LANTUS) 100 UNIT/ML injection Inject 20 Units into the skin at bedtime.   Marland Kitchen linagliptin (TRADJENTA) 5 MG TABS tablet Take 5 mg by mouth daily.  . Memantine HCl-Donepezil HCl (NAMZARIC) 28-10 MG CP24 Take 1 capsule by mouth every evening. For dementia  . pravastatin (PRAVACHOL) 40 MG tablet Take 40 mg by mouth  every evening.   . ranitidine (ZANTAC) 150 MG tablet Take 150 mg by mouth 2 (two) times daily. In the morning and at bedtime For reflux  . Skin Protectants, Misc. (CALAZIME SKIN PROTECTANT EX) Apply to buttocks every day and night shift for skin integrity  . traZODone (DESYREL) 25 mg TABS tablet Take 25 mg by mouth at bedtime.   Marland Kitchen umeclidinium bromide (INCRUSE ELLIPTA) 62.5 MCG/INH AEPB Inhale 2 puffs into the lungs daily.   No facility-administered encounter medications on file as of 03/08/2017.     Allergies (verified) Avelox [moxifloxacin hcl in nacl]; Codeine; Compazine [prochlorperazine edisylate]; Morphine and related; Mycobacterium; Penicillins; Phenothiazines; Prednisone; Reglan [metoclopramide]; and Sulfonamide derivatives   History: Past Medical History:  Diagnosis Date  . Abnormality of gait   . Anxiety state, unspecified   . Candidiasis of other urogenital sites   . Chronic airway obstruction, not elsewhere classified   . Chronic obstructive lung disease ([place])   . Depressive disorder, not elsewhere classified   . Esophageal reflux   . Hyperlipemia   . Lumbago   . Osteoporosis   . Spinal stenosis   . 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 Unspecified dementia without behavioral disturbance    Past Surgical History:  Procedure Laterality Date  . ABDOMINAL HYSTERECTOMY    . APPENDECTOMY    . CHOLECYSTECTOMY    .  VERTEBROPLASTY     History reviewed. No pertinent family history. Social History   Occupational History  . Not on file.   Social History Main Topics  . Smoking status: Former Smoker    Packs/day: 1.00    Years: 20.00  . Smokeless tobacco: Never Used  . Alcohol use No  . Drug use: No  . Sexual activity: No    Tobacco Counseling Counseling given: Not Answered   Activities of Daily Living In your present state of health, do you have any difficulty performing the following activities: 03/08/2017  Hearing? N  Vision? N    Difficulty concentrating or making decisions? Y  Walking or climbing stairs? Y  Dressing or bathing? Y  Doing errands, shopping? Y  Preparing Food and eating ? Y  Using the Toilet? Y  In the past six months, have you accidently leaked urine? N  Do you have problems with loss of bowel control? N  Managing your Medications? Y  Managing your Finances? Y  Housekeeping or managing your Housekeeping? Y  Some recent data might be hidden    Immunizations and Health Maintenance Immunization History  Administered Date(s) Administered  . Influenza Whole 05/22/2013  . Influenza-Unspecified 05/23/2014, 05/20/2015, 06/27/2016  . PPD Test 05/01/2016, 05/08/2016  . Pneumococcal-Unspecified 08/31/2011   There are no preventive care reminders to display for this patient.  Patient Care Team: Gildardo Cranker, DO as PCP - General (Internal Medicine) Nyoka Cowden Phylis Bougie, NP as Nurse Practitioner ([place], [place] ([place])  Indicate any recent [place] you may have received from other than [facility] providers in the past year (date may be approximate).     Assessment:   This is a routine wellness examination for [place].   Hearing/Vision screen No exam data present  Dietary issues and exercise activities discussed: Current Exercise Habits: The patient does not participate in regular exercise at present, Exercise limited by: neurologic condition(s)  Goals    None     Depression Screen PHQ 2/9 Scores 03/08/2017 12/06/2014  PHQ - 2 Score 0 0    Fall Risk Fall Risk  03/08/2017 12/06/2014  Falls in the past year? No No    Cognitive Function:     6CIT Screen 03/08/2017  What Year? 4 points  What month? 3 points  What time? 0 points  Count back from 20 0 points  Months in reverse 0 points  Repeat phrase 10 points  Total Score 17    Screening Tests Health Maintenance  Topic Date Due  . FOOT EXAM  01/14/2018 (Originally 01/13/2017)  .  INFLUENZA VACCINE  03/10/2017  . URINE MICROALBUMIN  06/25/2017  . HEMOGLOBIN A1C  06/28/2017  . OPHTHALMOLOGY EXAM  11/23/2017  . TETANUS/TDAP  12/05/2024  . COLONOSCOPY  03/18/2025  . DEXA SCAN  Completed  . PNA vac Low Risk Adult  Completed      Plan:    I have personally reviewed and addressed the Medicare Annual Wellness questionnaire and have noted the following in the patient's chart:  A. Medical and social history B. Use of alcohol, tobacco or illicit drugs  C. Current medications and supplements D. Functional ability and status E.  Nutritional status F.  Physical activity G. Advance directives H. List of other physicians I.  Hospitalizations, surgeries, and ER visits in previous 12 months J.  [place] to include hearing, vision, cognitive, depression L. Referrals and appointments - none  In addition, I have reviewed and discussed with patient  certain preventive protocols, quality metrics, and best practice recommendations. A written personalized care plan for preventive services as well as general preventive health recommendations were provided to patient.  See attached scanned questionnaire for additional information.   Signed,   Rich Reining, RN Nurse Health Advisor   Quick Notes   Health Maintenance: Up to date     Abnormal Screen: 6 CIT-17     Patient Concerns: None     Nurse Concerns: None

## 2017-03-19 ENCOUNTER — Encounter: Payer: Self-pay | Admitting: Adult Health

## 2017-03-19 ENCOUNTER — Non-Acute Institutional Stay (SKILLED_NURSING_FACILITY): Payer: Medicare Other | Admitting: Adult Health

## 2017-03-19 DIAGNOSIS — F22 Delusional disorders: Secondary | ICD-10-CM

## 2017-03-19 DIAGNOSIS — E785 Hyperlipidemia, unspecified: Secondary | ICD-10-CM | POA: Diagnosis not present

## 2017-03-19 DIAGNOSIS — E1169 Type 2 diabetes mellitus with other specified complication: Secondary | ICD-10-CM

## 2017-03-19 DIAGNOSIS — F0151 Vascular dementia with behavioral disturbance: Secondary | ICD-10-CM

## 2017-03-19 DIAGNOSIS — F0152 Vascular dementia, unspecified severity, with psychotic disturbance: Secondary | ICD-10-CM

## 2017-03-19 DIAGNOSIS — R634 Abnormal weight loss: Secondary | ICD-10-CM

## 2017-03-19 DIAGNOSIS — F0150 Vascular dementia without behavioral disturbance: Secondary | ICD-10-CM

## 2017-03-19 NOTE — Progress Notes (Signed)
Location:   [place] Room Number: 824 M PNTIR of Service:  SNF (31)   CODE STATUS: Full Code  Allergies  Allergen Reactions  . Avelox [Moxifloxacin Hcl In Nacl] Hives  . Codeine     Reports her throat swelled in the past but has tolerated since.  . Compazine [Prochlorperazine Edisylate] Nausea And Vomiting  . Morphine And Related     Patient doesn't recall  . Mycobacterium   . Penicillins     Patient doesn't recall.  . Phenothiazines     Patient doesn't recall  . Prednisone     Insomnia, confusion.  Marland Kitchen Reglan [Metoclopramide]     Patient doesn't recall "but it was awful"  . Sulfonamide Derivatives Hives    Chief Complaint  Patient presents with  . Acute Visit    Weight Loss    HPI:  She has been experiencing a slow progressive weight loss. Her weight in march was 188 pounds; in August weight is 178 pounds. There are no reports of her not having an appetite. She denies an loss of appetite; no feelings of depression or anxiety and tells me that she is sleeping well at night. She is taking lipid  medications which could interfere with her weight.   Past Medical History:  Diagnosis Date  . Abnormality of gait   . Anxiety state, unspecified   . Candidiasis of other urogenital sites   . Chronic airway obstruction, not elsewhere classified   . Chronic obstructive lung disease ([place])   . Depressive disorder, not elsewhere classified   . Esophageal reflux   . Hyperlipemia   . Lumbago   . Osteoporosis   . Spinal stenosis   . 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 Unspecified dementia without behavioral disturbance     Past Surgical History:  Procedure Laterality Date  . ABDOMINAL HYSTERECTOMY    . APPENDECTOMY    . CHOLECYSTECTOMY    . VERTEBROPLASTY      Social History   Social History  . Marital status: Divorced    Spouse name: N/A  . Number of children: N/A  . Years of education: N/A   Occupational History  . Not  on file.   Social History Main Topics  . Smoking status: Former Smoker    Packs/day: 1.00    Years: 20.00  . Smokeless tobacco: Never Used  . Alcohol use No  . Drug use: No  . Sexual activity: No   Other Topics Concern  . Not on file   Social History Narrative  . No narrative on file   History reviewed. No pertinent family history.    VITAL SIGNS BP 116/72   Pulse 60   Temp (!) 97 F (36.1 C)   Resp 18   Ht 5\' 2"  (1.575 m)   Wt 178 lb (80.7 kg)   SpO2 93%   BMI 32.56 kg/m   Patient's Medications  New Prescriptions   No medications on file  Previous Medications   ACETAMINOPHEN (TYLENOL) 325 MG TABLET    Take 325 mg by mouth every 4 (four) hours as needed (pain).    ALBUTEROL (PROVENTIL) (2.5 MG/3ML) 0.083% NEBULIZER SOLUTION    Take 2.5 mg by nebulization every 6 (six) hours as needed for wheezing or shortness of breath.   ALUMINUM-MAGNESIUM HYDROXIDE-SIMETHICONE (MAALOX) 443-154-00 MG/5ML SUSP    Take 30 mLs by mouth every 12 (twelve) hours as needed.   ASPIRIN EC 81 MG TABLET  Take 1 tablet (81 mg total) by mouth daily.   CHOLECALCIFEROL 1000 UNITS TABLET    Take 2,000 Units by mouth at bedtime.    DULOXETINE (CYMBALTA) 60 MG CAPSULE    Take 60 mg by mouth daily. For depression   FLUTICASONE (FLONASE) 50 MCG/ACT NASAL SPRAY    Place 2 sprays into both nostrils at bedtime.   FLUTICASONE-SALMETEROL (ADVAIR) 250-50 MCG/DOSE AEPB    Inhale 1 puff into the lungs 2 (two) times daily. For COPD   GABAPENTIN (NEURONTIN) 100 MG CAPSULE    Take 1 capsule (100 mg total) by mouth 3 (three) times daily.   GEMFIBROZIL (LOPID) 600 MG TABLET    Take 600 mg by mouth 2 (two) times daily before a meal.    GUAIFENESIN (MUCINEX MAXIMUM STRENGTH) 1200 MG TB12    Take 1 tablet by mouth 2 (two) times daily.    INSULIN GLARGINE (LANTUS) 100 UNIT/ML INJECTION    Inject 20 Units into the skin at bedtime.    LINAGLIPTIN (TRADJENTA) 5 MG TABS TABLET    Take 5 mg by mouth daily.   MEMANTINE  HCL-DONEPEZIL HCL (NAMZARIC) 28-10 MG CP24    Take 1 capsule by mouth every evening. For dementia   PRAVASTATIN (PRAVACHOL) 40 MG TABLET    Take 40 mg by mouth every evening.    RANITIDINE (ZANTAC) 150 MG TABLET    Take 150 mg by mouth 2 (two) times daily. In the morning and at bedtime For reflux   SKIN PROTECTANTS, MISC. (CALAZIME SKIN PROTECTANT EX)    Apply to buttocks every day and night shift for skin integrity   TRAZODONE (DESYREL) 25 MG TABS TABLET    Take 25 mg by mouth at bedtime.    UMECLIDINIUM BROMIDE (INCRUSE ELLIPTA) 62.5 MCG/INH AEPB    Inhale 2 puffs into the lungs daily.  Modified Medications   No medications on file  Discontinued Medications   No medications on file     SIGNIFICANT DIAGNOSTIC EXAMS  NO NEW EXAMS   LABS REVIEWED: PREVIOUS     05-21-16: wbc 9.4; hgb 13.0; hct 39.9 ;mcv 86.5; plt 355; glucose 200; bun 13.0; creat 0.61; k+ 4.4 ;na++ 138; liver normal albumin 4.3; hgb a1c 9.0 chol 158; ldl 90; trig 81; hdl 52  06-25-16: urine micro-albumin: 1.2  09-21-16: hgb a1c 8.6 09-25-16: hgb a1c 8.4 12-26-16: wbc 10.2; hgb 13; hct 39; mcv 87.0 plt 327; glucose 112; bun 14; creat 0.5; k+ 4.1; na++ 141; liver normal hgb a1c 7.3  12-29-16: urine culture: ESBL e-coli   NO NEW LABS    Review of Systems  Constitutional: Negative for malaise/fatigue.  Respiratory: Negative for cough and shortness of breath.   Cardiovascular: Negative for chest pain, palpitations and leg swelling.  Gastrointestinal: Negative for abdominal pain, constipation and heartburn.  Musculoskeletal: Negative for back pain, joint pain and myalgias.  Skin: Negative.   Neurological: Negative for dizziness.  Psychiatric/Behavioral: Negative for depression. The patient is not nervous/anxious.     Physical Exam  Constitutional: No distress.  Is overweight   Eyes: Conjunctivae are normal.  Neck: Neck supple. No JVD present. No thyromegaly present.  Cardiovascular: Normal rate, regular rhythm and  intact distal pulses.   Respiratory: Effort normal and breath sounds normal. No respiratory distress. She has no wheezes.  GI: Soft. Bowel sounds are normal. She exhibits no distension. There is no tenderness.  Musculoskeletal: She exhibits no edema.  Able to move all extremities   Lymphadenopathy:  She has no cervical adenopathy.  Neurological: She is alert.  Skin: Skin is warm and dry. She is not diaphoretic.  Psychiatric: She has a normal mood and affect.     ASSESSMENT/ PLAN:  TODAY:   1. Vascular dementia: without change in her status; will continue namzaric 28-10 mg daily and will monitor her weight is 179 pounds.   2. Weight loss: will stop the pravachol and her lopid will check tsh vit b 12; and folate and will continue to monitor her weight  3. Dyslipidemia:ldl is 90; trig 52 will stop her medications due to her weight loss and will monitor      Ok Edwards NP [facility]  Contact [phone] Monday through Friday 8am- 5pm  After hours call [phone]

## 2017-03-20 DIAGNOSIS — E039 Hypothyroidism, unspecified: Secondary | ICD-10-CM | POA: Diagnosis not present

## 2017-03-20 DIAGNOSIS — E0840 Diabetes mellitus due to underlying condition with diabetic neuropathy, unspecified: Secondary | ICD-10-CM | POA: Diagnosis not present

## 2017-03-20 DIAGNOSIS — D649 Anemia, unspecified: Secondary | ICD-10-CM | POA: Diagnosis not present

## 2017-03-20 DIAGNOSIS — E11618 Type 2 diabetes mellitus with other diabetic arthropathy: Secondary | ICD-10-CM | POA: Diagnosis not present

## 2017-03-20 LAB — TSH: TSH: 3.1 (ref 0.41–5.90)

## 2017-03-22 DIAGNOSIS — E11628 Type 2 diabetes mellitus with other skin complications: Secondary | ICD-10-CM | POA: Diagnosis not present

## 2017-03-22 DIAGNOSIS — E11618 Type 2 diabetes mellitus with other diabetic arthropathy: Secondary | ICD-10-CM | POA: Diagnosis not present

## 2017-03-22 DIAGNOSIS — D519 Vitamin B12 deficiency anemia, unspecified: Secondary | ICD-10-CM | POA: Diagnosis not present

## 2017-03-22 DIAGNOSIS — E0840 Diabetes mellitus due to underlying condition with diabetic neuropathy, unspecified: Secondary | ICD-10-CM | POA: Diagnosis not present

## 2017-03-22 DIAGNOSIS — D649 Anemia, unspecified: Secondary | ICD-10-CM | POA: Diagnosis not present

## 2017-03-22 LAB — VITAMIN B12: Vitamin B-12: 357

## 2017-03-23 DIAGNOSIS — L603 Nail dystrophy: Secondary | ICD-10-CM | POA: Diagnosis not present

## 2017-03-23 DIAGNOSIS — E1140 Type 2 diabetes mellitus with diabetic neuropathy, unspecified: Secondary | ICD-10-CM | POA: Diagnosis not present

## 2017-03-23 DIAGNOSIS — B351 Tinea unguium: Secondary | ICD-10-CM | POA: Diagnosis not present

## 2017-03-25 ENCOUNTER — Encounter: Payer: Self-pay | Admitting: Adult Health

## 2017-03-25 ENCOUNTER — Non-Acute Institutional Stay (SKILLED_NURSING_FACILITY): Payer: Medicare Other | Admitting: Adult Health

## 2017-03-25 DIAGNOSIS — K219 Gastro-esophageal reflux disease without esophagitis: Secondary | ICD-10-CM | POA: Diagnosis not present

## 2017-03-25 DIAGNOSIS — E876 Hypokalemia: Secondary | ICD-10-CM

## 2017-03-25 DIAGNOSIS — J441 Chronic obstructive pulmonary disease with (acute) exacerbation: Secondary | ICD-10-CM | POA: Diagnosis not present

## 2017-03-25 DIAGNOSIS — F0151 Vascular dementia with behavioral disturbance: Secondary | ICD-10-CM

## 2017-03-25 DIAGNOSIS — F0152 Vascular dementia, unspecified severity, with psychotic disturbance: Secondary | ICD-10-CM

## 2017-03-25 DIAGNOSIS — E785 Hyperlipidemia, unspecified: Secondary | ICD-10-CM

## 2017-03-25 DIAGNOSIS — E1169 Type 2 diabetes mellitus with other specified complication: Secondary | ICD-10-CM

## 2017-03-25 DIAGNOSIS — F22 Delusional disorders: Secondary | ICD-10-CM

## 2017-03-25 DIAGNOSIS — F0150 Vascular dementia without behavioral disturbance: Secondary | ICD-10-CM

## 2017-03-25 NOTE — Progress Notes (Signed)
Location:   [place] Room Number: 546 E VOJJK of Service:  SNF (31)   CODE STATUS: Full Code  Allergies  Allergen Reactions  . Avelox [Moxifloxacin Hcl In Nacl] Hives  . Codeine     Reports her throat swelled in the past but has tolerated since.  . Compazine [Prochlorperazine Edisylate] Nausea And Vomiting  . Morphine And Related     Patient doesn't recall  . Mycobacterium   . Penicillins     Patient doesn't recall.  . Phenothiazines     Patient doesn't recall  . Prednisone     Insomnia, confusion.  Marland Kitchen Reglan [Metoclopramide]     Patient doesn't recall "but it was awful"  . Sulfonamide Derivatives Hives    Chief Complaint  Patient presents with  . Medical Management of Chronic Issues    1 month follow up copd; dyslipidemia; hypokalemia; dementia; gerd    HPI:  She is a 76 year old long term resident of this facility being seen for the management of her chronic illnesses copd; dyslipidmia; hypokalemia; dementia; gerd . She tells me that she is feeling good; she denies any back pain; constipation; insomnia and anxiety. She does spend nearly all of her time in her room per her choice. There are no nursing concerns at this time.     Past Medical History:  Diagnosis Date  . Abnormality of gait   . Anxiety state, unspecified   . Candidiasis of other urogenital sites   . Chronic airway obstruction, not elsewhere classified   . Chronic obstructive lung disease ([place])   . Depressive disorder, not elsewhere classified   . Esophageal reflux   . Hyperlipemia   . Lumbago   . Osteoporosis   . Spinal stenosis   . 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 Unspecified dementia without behavioral disturbance     Past Surgical History:  Procedure Laterality Date  . ABDOMINAL HYSTERECTOMY    . APPENDECTOMY    . CHOLECYSTECTOMY    . VERTEBROPLASTY      Social History   Social History  . Marital status: Divorced    Spouse name: N/A  .  Number of children: N/A  . Years of education: N/A   Occupational History  . Not on file.   Social History Main Topics  . Smoking status: Former Smoker    Packs/day: 1.00    Years: 20.00  . Smokeless tobacco: Never Used  . Alcohol use No  . Drug use: No  . Sexual activity: No   Other Topics Concern  . Not on file   Social History Narrative  . No narrative on file   History reviewed. No pertinent family history.    VITAL SIGNS BP 120/68   Pulse 78   Temp 98.4 F (36.9 C)   Resp 18   Ht 5\' 2"  (1.575 m)   Wt 178 lb (80.7 kg)   SpO2 93%   BMI 32.56 kg/m   Patient's Medications  New Prescriptions   No medications on file  Previous Medications   ACETAMINOPHEN (TYLENOL) 325 MG TABLET    Take 325 mg by mouth every 4 (four) hours as needed (pain).    ASPIRIN EC 81 MG TABLET    Take 1 tablet (81 mg total) by mouth daily.   CHOLECALCIFEROL 1000 UNITS TABLET    Take 2,000 Units by mouth at bedtime.    DULOXETINE (CYMBALTA) 60 MG CAPSULE    Take 60 mg  by mouth daily. For depression   FLUTICASONE (FLONASE) 50 MCG/ACT NASAL SPRAY    Place 2 sprays into both nostrils at bedtime.   FLUTICASONE-SALMETEROL (ADVAIR) 250-50 MCG/DOSE AEPB    Inhale 1 puff into the lungs 2 (two) times daily. For COPD   GABAPENTIN (NEURONTIN) 100 MG CAPSULE    Take 1 capsule (100 mg total) by mouth 3 (three) times daily.   GUAIFENESIN (MUCINEX MAXIMUM STRENGTH) 1200 MG TB12    Take 1 tablet by mouth 2 (two) times daily.    INSULIN GLARGINE (LANTUS) 100 UNIT/ML INJECTION    Inject 20 Units into the skin at bedtime.    LINAGLIPTIN (TRADJENTA) 5 MG TABS TABLET    Take 5 mg by mouth daily.   MEMANTINE HCL-DONEPEZIL HCL (NAMZARIC) 28-10 MG CP24    Take 1 capsule by mouth every evening. For dementia   RANITIDINE (ZANTAC) 150 MG TABLET    Take 150 mg by mouth 2 (two) times daily. In the morning and at bedtime For reflux   TRAZODONE (DESYREL) 25 MG TABS TABLET    Take 25 mg by mouth at bedtime.    UMECLIDINIUM  BROMIDE (INCRUSE ELLIPTA) 62.5 MCG/INH AEPB    Inhale 2 puffs into the lungs daily.  Modified Medications   No medications on file  Discontinued Medications   ALBUTEROL (PROVENTIL) (2.5 MG/3ML) 0.083% NEBULIZER SOLUTION    Take 2.5 mg by nebulization every 6 (six) hours as needed for wheezing or shortness of breath.   ALUMINUM-MAGNESIUM HYDROXIDE-SIMETHICONE (MAALOX) 448-185-63 MG/5ML SUSP    Take 30 mLs by mouth every 12 (twelve) hours as needed.   GEMFIBROZIL (LOPID) 600 MG TABLET    Take 600 mg by mouth 2 (two) times daily before a meal.    PRAVASTATIN (PRAVACHOL) 40 MG TABLET    Take 40 mg by mouth every evening.    SKIN PROTECTANTS, MISC. (CALAZIME SKIN PROTECTANT EX)    Apply to buttocks every day and night shift for skin integrity     SIGNIFICANT DIAGNOSTIC EXAMS  NO NEW EXAMS    LABS REVIEWED: PREVIOUS    05-21-16: wbc 9.4; hgb 13.0; hct 39.9 ;mcv 86.5; plt 355; glucose 200; bun 13.0; creat 0.61; k+ 4.4 ;na++ 138; liver normal albumin 4.3; hgb a1c 9.0 chol 158; ldl 90; trig 81; hdl 52  06-25-16: urine micro-albumin: 1.2  09-21-16: hgb a1c 8.6 09-25-16: hgb a1c 8.4 12-26-16: wbc 10.2; hgb 13; hct 39; mcv 87.0 plt 327; glucose 112; bun 14; creat 0.5; k+ 4.1; na++ 141; liver normal hgb a1c 7.3  12-29-16: urine culture: ESBL e-coli   TODAY:   03-20-17: tsh 3.10 03-22-17: vit B 12: 357; folate 9.8   Review of Systems  Constitutional: Negative for malaise/fatigue.  Respiratory: Negative for cough and shortness of breath.   Cardiovascular: Negative for chest pain, palpitations and leg swelling.  Gastrointestinal: Negative for abdominal pain, constipation and heartburn.  Genitourinary: Negative for dysuria.  Musculoskeletal: Negative for back pain, joint pain and myalgias.  Skin: Negative.   Neurological: Negative for dizziness.  Psychiatric/Behavioral: Negative for depression. The patient is not nervous/anxious.    Physical Exam  Constitutional: No distress.  Is overweight     Eyes: Conjunctivae are normal.  Neck: Neck supple. No JVD present. No thyromegaly present.  Cardiovascular: Normal rate, regular rhythm and intact distal pulses.   Respiratory: Effort normal and breath sounds normal. No respiratory distress. She has no wheezes.  GI: Soft. Bowel sounds are normal. She exhibits no distension. There is  no tenderness.  Musculoskeletal: She exhibits no edema.  Able to move all extremities   Lymphadenopathy:    She has no cervical adenopathy.  Neurological: She is alert.  Skin: Skin is warm and dry. She is not diaphoretic.  Psychiatric: She has a normal mood and affect.    ASSESSMENT/ PLAN:  TODAY:   1. COPD: stable  will continue advair 250/50 twice daily;mucinex 1200 mg  twice daily spiriva 18 mcg daily   2. Dyslipidemia: stable ldl is 90; trig 52 is off pravachol and lopid will monitor    3. Hypokalemia:stable k+ 4.1  will continue k+ 40 meq daily  4. Vascular dementia: without change in her status; will continue namzaric 28-10 mg daily and will monitor her weight is 178 pounds.   5. Gerd: stable  will continue zantac 150 mg twice daily  PREVIOUS    6. Chronic low back pain: she is presently stable; will continue lidoderm 2 patches to lower back daily; neurontin 100 mg three times daily; cymbalta 60 mg daily  will monitor her status.   7. Anxiety: she is stable and is benefiting from cymbalta 60 mg daily will not make changes will continue trazodone 25 mg nightly for sleep.   8. Diabetes: her hgb a1c is 8.4. Stable Will continue  tradjenta 5  mg daily will change to lantus 20 units nightly  Is on asa statin; could not tolerate low does ace   9.  Hypertension: b/p 120/68: stable   will continue asa 81 mg daily    Could not tolerate ACE at low dose will monitor    Will check hgb a1c    Ok Edwards NP [facility]  Contact [phone] Monday through Friday 8am- 5pm  After hours call [phone]

## 2017-03-26 LAB — HEMOGLOBIN A1C: Hemoglobin A1C: 6.5

## 2017-04-01 DIAGNOSIS — R634 Abnormal weight loss: Secondary | ICD-10-CM | POA: Insufficient documentation

## 2017-04-08 ENCOUNTER — Encounter: Payer: Self-pay | Admitting: Internal Medicine

## 2017-04-08 ENCOUNTER — Non-Acute Institutional Stay (SKILLED_NURSING_FACILITY): Payer: Medicare Other | Admitting: Internal Medicine

## 2017-04-08 DIAGNOSIS — F0150 Vascular dementia without behavioral disturbance: Secondary | ICD-10-CM

## 2017-04-08 DIAGNOSIS — Z794 Long term (current) use of insulin: Secondary | ICD-10-CM | POA: Diagnosis not present

## 2017-04-08 DIAGNOSIS — E118 Type 2 diabetes mellitus with unspecified complications: Secondary | ICD-10-CM

## 2017-04-08 DIAGNOSIS — J302 Other seasonal allergic rhinitis: Secondary | ICD-10-CM | POA: Diagnosis not present

## 2017-04-08 DIAGNOSIS — F22 Delusional disorders: Secondary | ICD-10-CM | POA: Diagnosis not present

## 2017-04-08 DIAGNOSIS — H1033 Unspecified acute conjunctivitis, bilateral: Secondary | ICD-10-CM

## 2017-04-08 DIAGNOSIS — F0151 Vascular dementia with behavioral disturbance: Secondary | ICD-10-CM | POA: Diagnosis not present

## 2017-04-08 DIAGNOSIS — F0152 Vascular dementia, unspecified severity, with psychotic disturbance: Secondary | ICD-10-CM

## 2017-04-08 NOTE — Progress Notes (Signed)
Patient ID: [name] [name], female   DOB: 01-14-1941, 76 y.o.   MRN: [id number]    DATE: 04/08/17  Location:    [place] Room Number: 789F Place of Service: SNF (31)   Extended Emergency Contact Information Primary Emergency Contact: [name]) Address: [address]          [name] Phone: [phone] Relation: Son Secondary Emergency Contact: Charletta Cousin [place] Phone: [phone] Work Phone: [phone] Relation: Son  Advanced Directive information  [place]  Chief Complaint  Patient presents with  . Acute Visit    HA    HPI:  76 yo female long term resident seen today for frontal HA and sore throat. No f/c. No sneezing. Eyes itchy. No sinus pressure, cough, post nasal drip, CP or SOB. Appetite ok and sleeps well. She has a hx DM and COPD. She takes flonase, advair, mucinex, ellipta, lantus and tradjenta.   She is a poor historian due to dementia. Hx obtained from chart.  Past Medical History:  Diagnosis Date  . Abnormality of gait   . Anxiety state, unspecified   . Candidiasis of other urogenital sites   . Chronic airway obstruction, not elsewhere classified   . Chronic obstructive lung disease ([place])   . Depressive disorder, not elsewhere classified   . Esophageal reflux   . Hyperlipemia   . Lumbago   . Osteoporosis   . Spinal stenosis   . 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 Unspecified dementia without behavioral disturbance     Past Surgical History:  Procedure Laterality Date  . ABDOMINAL HYSTERECTOMY    . APPENDECTOMY    . CHOLECYSTECTOMY    . VERTEBROPLASTY      Patient Care Team: Gildardo Cranker, DO as PCP - General (Internal Medicine) Nyoka Cowden Phylis Bougie, NP as Nurse Practitioner ([place] [place] ([place])  Social History   Social History  . Marital status: Divorced    Spouse  name: N/A  . Number of children: N/A  . Years of education: N/A   Occupational History  . Not on file.   Social History Main Topics  . Smoking status: Former Smoker    Packs/day: 1.00    Years: 20.00  . Smokeless tobacco: Never Used  . Alcohol use No  . Drug use: No  . Sexual activity: No   Other Topics Concern  . Not on file   Social History Narrative  . No narrative on file     reports that she has quit smoking. She has a 20.00 pack-year smoking history. She has never used smokeless tobacco. She reports that she does not drink alcohol or use drugs.  No family history on file. No family status information on file.    Immunization History  Administered Date(s) Administered  . Influenza Whole 05/22/2013  . Influenza-Unspecified 05/23/2014, 05/20/2015, 06/27/2016  . PPD Test 05/01/2016, 05/08/2016  . Pneumococcal-Unspecified 08/31/2011    Allergies  Allergen Reactions  . Avelox [Moxifloxacin Hcl In Nacl] Hives  . Codeine     Reports her throat swelled in the past but has tolerated since.  . Compazine [Prochlorperazine Edisylate] Nausea And Vomiting  . Morphine And Related     Patient doesn't recall  . Mycobacterium   . Penicillins     Patient doesn't recall.  . Phenothiazines     Patient doesn't recall  . Prednisone  Insomnia, confusion.  Marland Kitchen Reglan [Metoclopramide]     Patient doesn't recall "but it was awful"  . Sulfonamide Derivatives Hives    Medications: Patient's Medications  New Prescriptions   No medications on file  Previous Medications   ACETAMINOPHEN (TYLENOL) 325 MG TABLET    Take 325 mg by mouth every 4 (four) hours as needed (pain).    ASPIRIN EC 81 MG TABLET    Take 1 tablet (81 mg total) by mouth daily.   CHOLECALCIFEROL 1000 UNITS TABLET    Take 2,000 Units by mouth at bedtime.    DULOXETINE (CYMBALTA) 60 MG CAPSULE    Take 60 mg by mouth daily. For depression   FLUTICASONE (FLONASE) 50 MCG/ACT NASAL SPRAY    Place 2 sprays into both  nostrils at bedtime.   FLUTICASONE-SALMETEROL (ADVAIR) 250-50 MCG/DOSE AEPB    Inhale 1 puff into the lungs 2 (two) times daily. For COPD   GABAPENTIN (NEURONTIN) 100 MG CAPSULE    Take 1 capsule (100 mg total) by mouth 3 (three) times daily.   GUAIFENESIN (MUCINEX MAXIMUM STRENGTH) 1200 MG TB12    Take 1 tablet by mouth 2 (two) times daily.    INSULIN GLARGINE (LANTUS) 100 UNIT/ML INJECTION    Inject 20 Units into the skin at bedtime.    LINAGLIPTIN (TRADJENTA) 5 MG TABS TABLET    Take 5 mg by mouth daily.   MEMANTINE HCL-DONEPEZIL HCL (NAMZARIC) 28-10 MG CP24    Take 1 capsule by mouth every evening. For dementia   RANITIDINE (ZANTAC) 150 MG TABLET    Take 150 mg by mouth 2 (two) times daily. In the morning and at bedtime For reflux   TRAZODONE (DESYREL) 25 MG TABS TABLET    Take 25 mg by mouth at bedtime.    UMECLIDINIUM BROMIDE (INCRUSE ELLIPTA) 62.5 MCG/INH AEPB    Inhale 2 puffs into the lungs daily.  Modified Medications   No medications on file  Discontinued Medications   No medications on file    Review of Systems  Vitals:   04/08/17 1352  BP: 116/62  Pulse: 64  Temp: 98.4 F (36.9 C)  Weight: 178 lb 3.2 oz (80.8 kg)   Body mass index is 32.59 kg/m.  Physical Exam  Constitutional: She appears well-developed and well-nourished.  HENT:  Mouth/Throat: Oropharynx is clear and moist. No oropharyngeal exudate.  MMM; no oral thrush; frontal sinus TTP but no maxillary sinus TTP  Eyes: Right eye exhibits discharge (yellow green). Left eye exhibits discharge (yellow green). No scleral icterus.  B/l conjunctival injection  Neck: Neck supple.  Cardiovascular: Normal rate, regular rhythm and intact distal pulses.  Exam reveals no gallop and no friction rub.   Murmur (1/6 SEM) heard. Pulmonary/Chest: Effort normal and breath sounds normal. No respiratory distress. She has no wheezes. She has no rales.  No rhonchi  Musculoskeletal: She exhibits edema.  Lymphadenopathy:    She has  no cervical adenopathy.  Neurological: She is alert.  Skin: No rash noted.  Psychiatric: She has a normal mood and affect. Her behavior is normal.     Labs reviewed: Abstract on 03/29/2017  Component Date Value Ref Range Status  . Hemoglobin A1C 03/26/2017 6.5   Final  Abstract on 03/23/2017  Component Date Value Ref Range Status  . Vitamin B-12 03/22/2017 357   Final  Abstract on 03/22/2017  Component Date Value Ref Range Status  . TSH 03/20/2017 3.10  0.41 - 5.90 Final    No results  found.   Assessment/Plan   ICD-10-CM   1. Acute conjunctivitis of both eyes, unspecified acute conjunctivitis type H10.33   2. Seasonal allergic rhinitis, unspecified trigger J30.2   3. Type 2 diabetes mellitus with complication, with long-term current use of insulin (HCC) E11.8    Z79.4   4. 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 Perlie Gold  [facility] [address] [place], [place] [address] [phone] Cell (Monday-Friday 8 AM - 5 PM) [phone] After 5 PM and follow prompts

## 2017-05-07 ENCOUNTER — Non-Acute Institutional Stay (SKILLED_NURSING_FACILITY): Payer: Medicare Other | Admitting: Adult Health

## 2017-05-07 ENCOUNTER — Encounter: Payer: Self-pay | Admitting: Adult Health

## 2017-05-07 DIAGNOSIS — E1159 Type 2 diabetes mellitus with other circulatory complications: Secondary | ICD-10-CM | POA: Diagnosis not present

## 2017-05-07 DIAGNOSIS — I10 Essential (primary) hypertension: Secondary | ICD-10-CM | POA: Diagnosis not present

## 2017-05-07 DIAGNOSIS — F411 Generalized anxiety disorder: Secondary | ICD-10-CM

## 2017-05-07 DIAGNOSIS — M545 Low back pain, unspecified: Secondary | ICD-10-CM

## 2017-05-07 DIAGNOSIS — E1139 Type 2 diabetes mellitus with other diabetic ophthalmic complication: Secondary | ICD-10-CM

## 2017-05-07 DIAGNOSIS — E1165 Type 2 diabetes mellitus with hyperglycemia: Secondary | ICD-10-CM | POA: Diagnosis not present

## 2017-05-07 DIAGNOSIS — G8929 Other chronic pain: Secondary | ICD-10-CM

## 2017-05-07 DIAGNOSIS — I152 Hypertension secondary to endocrine disorders: Secondary | ICD-10-CM

## 2017-05-07 NOTE — Progress Notes (Signed)
Location:   [place] Room Number: 144 Y JEHUD of Service:  SNF (31)   CODE STATUS: Full Code  Allergies  Allergen Reactions  . Avelox [Moxifloxacin Hcl In Nacl] Hives  . Codeine     Reports her throat swelled in the past but has tolerated since.  . Compazine [Prochlorperazine Edisylate] Nausea And Vomiting  . Morphine And Related     Patient doesn't recall  . Mycobacterium   . Penicillins     Patient doesn't recall.  . Phenothiazines     Patient doesn't recall  . Prednisone     Insomnia, confusion.  Marland Kitchen Reglan [Metoclopramide]     Patient doesn't recall "but it was awful"  . Sulfonamide Derivatives Hives    Chief Complaint  Patient presents with  . Medical Management of Chronic Issues    Chronic low back pain; anxiety; diabetes and hypertension    HPI:  She is a 76 year old long term resident of this facility being seen for the management of her chronic illnesses: Chronic low back pain; anxiety; diabetes and hypertension. She denies any back pain; no numbness in her lower extremities; denies any anxiety. There are no nursing concerns at this time.    Past Medical History:  Diagnosis Date  . Abnormality of gait   . Anxiety state, unspecified   . Candidiasis of other urogenital sites   . Chronic airway obstruction, not elsewhere classified   . Chronic obstructive lung disease ([place])   . Depressive disorder, not elsewhere classified   . Esophageal reflux   . Hyperlipemia   . Lumbago   . Osteoporosis   . Spinal stenosis   . 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 Unspecified dementia without behavioral disturbance     Past Surgical History:  Procedure Laterality Date  . ABDOMINAL HYSTERECTOMY    . APPENDECTOMY    . CHOLECYSTECTOMY    . VERTEBROPLASTY      Social History   Social History  . Marital status: Divorced    Spouse name: N/A  . Number of children: N/A  . Years of education: N/A   Occupational History  .  Not on file.   Social History Main Topics  . Smoking status: Former Smoker    Packs/day: 1.00    Years: 20.00  . Smokeless tobacco: Never Used  . Alcohol use No  . Drug use: No  . Sexual activity: No   Other Topics Concern  . Not on file   Social History Narrative  . No narrative on file   History reviewed. No pertinent family history.    VITAL SIGNS BP 110/60   Pulse 70   Temp 97.9 F (36.6 C)   Resp 16   Ht 5\' 2"  (1.575 m)   Wt 175 lb 14.4 oz (79.8 kg)   SpO2 93%   BMI 32.17 kg/m   Patient's Medications  New Prescriptions   No medications on file  Previous Medications   ACETAMINOPHEN (TYLENOL) 325 MG TABLET    Take 325 mg by mouth every 4 (four) hours as needed (pain).    ASPIRIN EC 81 MG TABLET    Take 1 tablet (81 mg total) by mouth daily.   CHOLECALCIFEROL 1000 UNITS TABLET    Take 2,000 Units by mouth at bedtime.    DULOXETINE (CYMBALTA) 60 MG CAPSULE    Take 60 mg by mouth daily. For depression   FLUTICASONE (FLONASE) 50 MCG/ACT NASAL SPRAY  Place 2 sprays into both nostrils at bedtime.   FLUTICASONE-SALMETEROL (ADVAIR) 250-50 MCG/DOSE AEPB    Inhale 1 puff into the lungs 2 (two) times daily. For COPD   GABAPENTIN (NEURONTIN) 100 MG CAPSULE    Take 1 capsule (100 mg total) by mouth 3 (three) times daily.   GUAIFENESIN (MUCINEX MAXIMUM STRENGTH) 1200 MG TB12    Take 1 tablet by mouth 2 (two) times daily.    INSULIN GLARGINE (LANTUS) 100 UNIT/ML INJECTION    Inject 20 Units into the skin at bedtime.    LINAGLIPTIN (TRADJENTA) 5 MG TABS TABLET    Take 5 mg by mouth daily.   MEMANTINE HCL-DONEPEZIL HCL (NAMZARIC) 28-10 MG CP24    Take 1 capsule by mouth every evening. For dementia   RANITIDINE (ZANTAC) 150 MG TABLET    Take 150 mg by mouth 2 (two) times daily. In the morning and at bedtime For reflux   TRAZODONE (DESYREL) 25 MG TABS TABLET    Take 25 mg by mouth at bedtime.    UMECLIDINIUM BROMIDE (INCRUSE ELLIPTA) 62.5 MCG/INH AEPB    Inhale 2 puffs into the  lungs daily.  Modified Medications   No medications on file  Discontinued Medications   No medications on file     SIGNIFICANT DIAGNOSTIC EXAMS  NO NEW EXAMS    LABS REVIEWED: PREVIOUS    05-21-16: wbc 9.4; hgb 13.0; hct 39.9 ;mcv 86.5; plt 355; glucose 200; bun 13.0; creat 0.61; k+ 4.4 ;na++ 138; liver normal albumin 4.3; hgb a1c 9.0 chol 158; ldl 90; trig 81; hdl 52  06-25-16: urine micro-albumin: 1.2  09-21-16: hgb a1c 8.6 09-25-16: hgb a1c 8.4 12-26-16: wbc 10.2; hgb 13; hct 39; mcv 87.0 plt 327; glucose 112; bun 14; creat 0.5; k+ 4.1; na++ 141; liver normal hgb a1c 7.3  12-29-16: urine culture: ESBL e-coli  03-20-17: tsh 3.10 03-22-17: vit B 12: 357; folate 9.8  NO NEW LABS    Review of Systems  Constitutional: Negative for malaise/fatigue.  Respiratory: Negative for cough and shortness of breath.   Cardiovascular: Negative for chest pain, palpitations and leg swelling.  Gastrointestinal: Negative for abdominal pain, constipation and heartburn.  Musculoskeletal: Negative for back pain, joint pain and myalgias.  Skin: Negative.   Neurological: Negative for dizziness.  Psychiatric/Behavioral: The patient is not nervous/anxious.    Physical Exam  Constitutional: She appears well-developed and well-nourished. No distress.  Slightly overweight   Eyes: Conjunctivae are normal.  Neck: Neck supple. No thyromegaly present.  Cardiovascular: Normal rate, regular rhythm, normal heart sounds and intact distal pulses.   Pulmonary/Chest: Effort normal and breath sounds normal. No respiratory distress.  Abdominal: Soft. Bowel sounds are normal. She exhibits no distension. There is no tenderness.  Musculoskeletal: She exhibits no edema.  Is able to move all extremities   Lymphadenopathy:    She has no cervical adenopathy.  Neurological: She is alert.  Skin: Skin is warm and dry. She is not diaphoretic.  Psychiatric: She has a normal mood and affect.    ASSESSMENT/ PLAN:  TODAY:     1. Chronic low back pain: she is presently stable; will continue lidoderm 2 patches to lower back daily; neurontin 100 mg three times daily; cymbalta 60 mg daily  will monitor her status.   2. Anxiety: she is stable and is benefiting from cymbalta 60 mg daily will not make changes will continue trazodone 25 mg nightly for sleep.   3. Diabetes: her hgb a1c is 8.4. Stable Will  continue  tradjenta 5  mg daily will change to lantus 20 units nightly  Is on asa ; could not tolerate low does ace   4.  Hypertension: b/p 110/60 stable   will continue asa 81 mg daily    Could not tolerate ACE at low dose will monitor  PREVIOUS     5. COPD: stable  will continue advair 250/50 twice daily;mucinex 1200 mg  twice daily ; Ellipts 2 puffs daily    6. Dyslipidemia: stable ldl is 90; trig 52 is currently off medications; due to some weight loss.     7. Hypokalemia:stable is currently off supplement   8. Vascular dementia: without change in her status; will continue namzaric 28-10 mg daily and will monitor her weight is 175 pounds.   9. Gerd: stable  will continue zantac 150 mg twice daily    Ok Edwards NP [facility]  Contact [phone] Monday through Friday 8am- 5pm  After hours call [phone]

## 2017-05-12 ENCOUNTER — Non-Acute Institutional Stay (SKILLED_NURSING_FACILITY): Payer: Medicare Other | Admitting: Adult Health

## 2017-05-12 ENCOUNTER — Encounter: Payer: Self-pay | Admitting: Adult Health

## 2017-05-12 DIAGNOSIS — F0150 Vascular dementia without behavioral disturbance: Secondary | ICD-10-CM

## 2017-05-12 DIAGNOSIS — G8929 Other chronic pain: Secondary | ICD-10-CM | POA: Diagnosis not present

## 2017-05-12 DIAGNOSIS — M545 Low back pain: Secondary | ICD-10-CM

## 2017-05-12 DIAGNOSIS — J441 Chronic obstructive pulmonary disease with (acute) exacerbation: Secondary | ICD-10-CM

## 2017-05-12 DIAGNOSIS — F0152 Vascular dementia, unspecified severity, with psychotic disturbance: Secondary | ICD-10-CM

## 2017-05-12 NOTE — Progress Notes (Signed)
Location:   [place] Room Number: 161 [address] RUEAV of Service:  SNF (31)   CODE STATUS: Full Code  Allergies  Allergen Reactions  . Avelox [Moxifloxacin Hcl In Nacl] Hives  . Codeine     Reports her throat swelled in the past but has tolerated since.  . Compazine [Prochlorperazine Edisylate] Nausea And Vomiting  . Morphine And Related     Patient doesn't recall  . Mycobacterium   . Penicillins     Patient doesn't recall.  . Phenothiazines     Patient doesn't recall  . Prednisone     Insomnia, confusion.  Marland Kitchen Reglan [Metoclopramide]     Patient doesn't recall "but it was awful"  . Sulfonamide Derivatives Hives    Chief Complaint  Patient presents with  . Acute Visit    Care Plan Meeting    HPI:  We have come together with the care plan team and resident for her routine care plan meeting. There are no family members present. Her care plan was discussed. She will decline her adl care at times. She did tell me that she is feeling good and has no concerns. There are no nursing concerns at this time. The care plan team has reviewed her medications and her care plan goals.    Past Medical History:  Diagnosis Date  . Abnormality of gait   . Anxiety state, unspecified   . Candidiasis of other urogenital sites   . Chronic airway obstruction, not elsewhere classified   . Chronic obstructive lung disease ([place])   . Depressive disorder, not elsewhere classified   . Esophageal reflux   . Hyperlipemia   . Lumbago   . Osteoporosis   . Spinal stenosis   . 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 Unspecified dementia without behavioral disturbance     Past Surgical History:  Procedure Laterality Date  . ABDOMINAL HYSTERECTOMY    . APPENDECTOMY    . CHOLECYSTECTOMY    . VERTEBROPLASTY      Social History   Social History  . Marital status: Divorced    Spouse name: N/A  . Number of children: N/A  . Years of education: N/A    Occupational History  . Not on file.   Social History Main Topics  . Smoking status: Former Smoker    Packs/day: 1.00    Years: 20.00  . Smokeless tobacco: Never Used  . Alcohol use No  . Drug use: No  . Sexual activity: No   Other Topics Concern  . Not on file   Social History Narrative  . No narrative on file   History reviewed. No pertinent family history.    VITAL SIGNS BP 100/68   Pulse 72   Temp (!) 97.4 F (36.3 C)   Resp 17   Ht 5\' 2"  (1.575 m)   Wt 176 lb 14.4 oz (80.2 kg)   BMI 32.36 kg/m   Patient's Medications  New Prescriptions   No medications on file  Previous Medications   ACETAMINOPHEN (TYLENOL) 325 MG TABLET    Take 325 mg by mouth every 4 (four) hours as needed (pain).    ASPIRIN EC 81 MG TABLET    Take 1 tablet (81 mg total) by mouth daily.   CHOLECALCIFEROL 1000 UNITS TABLET    Take 2,000 Units by mouth at bedtime.    DULOXETINE (CYMBALTA) 60 MG CAPSULE    Take 60 mg by mouth daily. For depression  FLUTICASONE (FLONASE) 50 MCG/ACT NASAL SPRAY    Place 2 sprays into both nostrils at bedtime.   FLUTICASONE-SALMETEROL (ADVAIR) 250-50 MCG/DOSE AEPB    Inhale 1 puff into the lungs 2 (two) times daily. For COPD   GABAPENTIN (NEURONTIN) 100 MG CAPSULE    Take 1 capsule (100 mg total) by mouth 3 (three) times daily.   GUAIFENESIN (MUCINEX MAXIMUM STRENGTH) 1200 MG TB12    Take 1 tablet by mouth 2 (two) times daily.    INSULIN GLARGINE (LANTUS) 100 UNIT/ML INJECTION    Inject 20 Units into the skin at bedtime.    LINAGLIPTIN (TRADJENTA) 5 MG TABS TABLET    Take 5 mg by mouth daily.   MEMANTINE HCL-DONEPEZIL HCL (NAMZARIC) 28-10 MG CP24    Take 1 capsule by mouth every evening. For dementia   RANITIDINE (ZANTAC) 150 MG TABLET    Take 150 mg by mouth 2 (two) times daily. In the morning and at bedtime For reflux   TRAZODONE (DESYREL) 25 MG TABS TABLET    Take 25 mg by mouth at bedtime.    UMECLIDINIUM BROMIDE (INCRUSE ELLIPTA) 62.5 MCG/INH AEPB     Inhale 2 puffs into the lungs daily.  Modified Medications   No medications on file  Discontinued Medications   No medications on file     SIGNIFICANT DIAGNOSTIC EXAMS  NO NEW EXAMS    LABS REVIEWED: PREVIOUS    05-21-16: wbc 9.4; hgb 13.0; hct 39.9 ;mcv 86.5; plt 355; glucose 200; bun 13.0; creat 0.61; k+ 4.4 ;na++ 138; liver normal albumin 4.3; hgb a1c 9.0 chol 158; ldl 90; trig 81; hdl 52  06-25-16: urine micro-albumin: 1.2  09-21-16: hgb a1c 8.6 09-25-16: hgb a1c 8.4 12-26-16: wbc 10.2; hgb 13; hct 39; mcv 87.0 plt 327; glucose 112; bun 14; creat 0.5; k+ 4.1; na++ 141; liver normal hgb a1c 7.3  12-29-16: urine culture: ESBL e-coli  03-20-17: tsh 3.10 03-22-17: vit B 12: 357; folate 9.8  NO NEW LABS    Review of Systems  Constitutional: Negative for malaise/fatigue.  Respiratory: Negative for cough and shortness of breath.   Cardiovascular: Negative for chest pain, palpitations and leg swelling.  Gastrointestinal: Negative for abdominal pain, constipation and heartburn.  Musculoskeletal: Negative for back pain, joint pain and myalgias.  Skin: Negative.   Neurological: Negative for dizziness.  Psychiatric/Behavioral: The patient is not nervous/anxious.    Physical Exam  Constitutional: She appears well-developed and well-nourished. No distress.  Slightly over weight   Eyes: Conjunctivae are normal.  Neck: Neck supple. No thyromegaly present.  Cardiovascular: Normal rate, regular rhythm, normal heart sounds and intact distal pulses.   Pulmonary/Chest: Effort normal and breath sounds normal. No respiratory distress.  Abdominal: Soft. Bowel sounds are normal. She exhibits no distension. There is no tenderness.  Musculoskeletal: She exhibits no edema.  Is able to move all extremities   Lymphadenopathy:    She has no cervical adenopathy.  Neurological: She is alert.  Skin: Skin is warm and dry. She is not diaphoretic.  Psychiatric: She has a normal mood and affect.    ASSESSMENT/ PLAN:  TODAY:   1. Chronic low back pain:  2. COPD:  3. Vascular dementia:   At this time will continue her current care plan and will not make changes will continue   Time spent with patient and care plan team 30 minutes : discussed her overall status; her care plan; medications; and future care needs; everyone has verbalized understanding.   MD is  aware of resident's narcotic use and is in agreement with current plan of care. We will attempt to wean resident as apropriate   Ok Edwards NP [facility] Adult Medicine  Contact [phone] Monday through Friday 8am- 5pm  After hours call [phone]

## 2017-05-19 ENCOUNTER — Encounter: Payer: Self-pay | Admitting: Adult Health

## 2017-05-19 ENCOUNTER — Non-Acute Institutional Stay (SKILLED_NURSING_FACILITY): Payer: Medicare Other | Admitting: Adult Health

## 2017-05-19 DIAGNOSIS — H1033 Unspecified acute conjunctivitis, bilateral: Secondary | ICD-10-CM

## 2017-05-19 NOTE — Progress Notes (Signed)
Location:   [place] Room Number: 950D Place of Service:  SNF (31)   CODE STATUS: Full Code  Allergies  Allergen Reactions  . Avelox [Moxifloxacin Hcl In Nacl] Hives  . Codeine     Reports her throat swelled in the past but has tolerated since.  . Compazine [Prochlorperazine Edisylate] Nausea And Vomiting  . Morphine And Related     Patient doesn't recall  . Mycobacterium   . Penicillins     Patient doesn't recall.  . Phenothiazines     Patient doesn't recall  . Prednisone     Insomnia, confusion.  Marland Kitchen Reglan [Metoclopramide]     Patient doesn't recall "but it was awful"  . Sulfonamide Derivatives Hives    Chief Complaint  Patient presents with  . Acute Visit    Bilateral -> Red Eyes    HPI:  She is telling me that both her eyes are red and painful. She denies any itching present. There are no reports of fever present. She denies any changes in her vision.    Past Medical History:  Diagnosis Date  . Abnormality of gait   . Anxiety state, unspecified   . Candidiasis of other urogenital sites   . Chronic airway obstruction, not elsewhere classified   . Chronic obstructive lung disease ([place])   . Depressive disorder, not elsewhere classified   . Esophageal reflux   . Hyperlipemia   . Lumbago   . Osteoporosis   . Spinal stenosis   . 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 Unspecified dementia without behavioral disturbance     Past Surgical History:  Procedure Laterality Date  . ABDOMINAL HYSTERECTOMY    . APPENDECTOMY    . CHOLECYSTECTOMY    . VERTEBROPLASTY      Social History   Social History  . Marital status: Divorced    Spouse name: N/A  . Number of children: N/A  . Years of education: N/A   Occupational History  . Not on file.   Social History Main Topics  . Smoking status: Former Smoker    Packs/day: 1.00    Years: 20.00  . Smokeless tobacco: Never Used  . Alcohol use No  . Drug  use: No  . Sexual activity: No   Other Topics Concern  . Not on file   Social History Narrative  . No narrative on file   No family history on file.    VITAL SIGNS BP (!) 116/55   Pulse 78   Temp (!) 97.5 F (36.4 C)   Resp 20   Ht 5\' 2"  (1.575 m)   Wt 176 lb (79.8 kg)   BMI 32.19 kg/m    Patient's Medications  New Prescriptions   No medications on file  Previous Medications   ACETAMINOPHEN (TYLENOL) 325 MG TABLET    Take 325 mg by mouth every 4 (four) hours as needed (pain).    ASPIRIN EC 81 MG TABLET    Take 1 tablet (81 mg total) by mouth daily.   CHOLECALCIFEROL 1000 UNITS TABLET    Take 2,000 Units by mouth at bedtime.    DULOXETINE (CYMBALTA) 60 MG CAPSULE    Take 60 mg by mouth daily. For depression   FLUTICASONE (FLONASE) 50 MCG/ACT NASAL SPRAY    Place 2 sprays into both nostrils at bedtime.   FLUTICASONE-SALMETEROL (ADVAIR) 250-50 MCG/DOSE AEPB    Inhale 1 puff into the lungs 2 (two) times daily. For  COPD   GABAPENTIN (NEURONTIN) 100 MG CAPSULE    Take 1 capsule (100 mg total) by mouth 3 (three) times daily.   GUAIFENESIN (MUCINEX MAXIMUM STRENGTH) 1200 MG TB12    Take 1 tablet by mouth 2 (two) times daily.    INSULIN GLARGINE (LANTUS) 100 UNIT/ML INJECTION    Inject 20 Units into the skin at bedtime.    LINAGLIPTIN (TRADJENTA) 5 MG TABS TABLET    Take 5 mg by mouth daily.   MEMANTINE HCL-DONEPEZIL HCL (NAMZARIC) 28-10 MG CP24    Take 1 capsule by mouth every evening. For dementia   RANITIDINE (ZANTAC) 150 MG TABLET    Take 150 mg by mouth 2 (two) times daily. In the morning and at bedtime For reflux   TRAZODONE (DESYREL) 25 MG TABS TABLET    Take 25 mg by mouth at bedtime.    UMECLIDINIUM BROMIDE (INCRUSE ELLIPTA) 62.5 MCG/INH AEPB    Inhale 2 puffs into the lungs daily.  Modified Medications   No medications on file  Discontinued Medications   No medications on file     SIGNIFICANT DIAGNOSTIC EXAMS  NO NEW EXAMS    LABS REVIEWED: PREVIOUS     05-21-16: wbc 9.4; hgb 13.0; hct 39.9 ;mcv 86.5; plt 355; glucose 200; bun 13.0; creat 0.61; k+ 4.4 ;na++ 138; liver normal albumin 4.3; hgb a1c 9.0 chol 158; ldl 90; trig 81; hdl 52  06-25-16: urine micro-albumin: 1.2  09-21-16: hgb a1c 8.6 09-25-16: hgb a1c 8.4 12-26-16: wbc 10.2; hgb 13; hct 39; mcv 87.0 plt 327; glucose 112; bun 14; creat 0.5; k+ 4.1; na++ 141; liver normal hgb a1c 7.3  12-29-16: urine culture: ESBL e-coli  03-20-17: tsh 3.10 03-22-17: vit B 12: 357; folate 9.8  NO NEW LABS    Review of Systems  Constitutional: Negative for malaise/fatigue.  Eyes: Positive for pain and discharge.  Respiratory: Negative for cough and shortness of breath.   Cardiovascular: Negative for chest pain, palpitations and leg swelling.  Gastrointestinal: Negative for abdominal pain, constipation and heartburn.  Musculoskeletal: Negative for back pain, joint pain and myalgias.  Skin: Negative.   Neurological: Negative for dizziness.  Psychiatric/Behavioral: The patient is not nervous/anxious.    Physical Exam  Constitutional: She appears well-developed and well-nourished. No distress.  Slightly overweight   Eyes: Pupils are equal, round, and reactive to light. Right eye exhibits discharge. Left eye exhibits discharge.  Conjunctivae are red with periorbital redness present has yellow drainage   Neck: Neck supple. No thyromegaly present.  Cardiovascular: Normal rate, regular rhythm, normal heart sounds and intact distal pulses.   Pulmonary/Chest: Effort normal and breath sounds normal. No respiratory distress.  Abdominal: Soft. Bowel sounds are normal. She exhibits no distension. There is no tenderness.  Musculoskeletal: She exhibits no edema.  Able to move all extremities   Lymphadenopathy:    She has no cervical adenopathy.  Neurological: She is alert.  Skin: Skin is warm and dry. She is not diaphoretic.  Psychiatric: She has a normal mood and affect.     ASSESSMENT/ PLAN:  TODAY:    1. Bilateral conjunctivitis: worse: will begin tobramycin 0.3% solution 1 drop both eyes four times daily for 10 days.   MD is aware of resident's narcotic use and is in agreement with current plan of care. We will attempt to wean resident as apropriate   Ok Edwards NP [facility]  Contact [phone] Monday through Friday 8am- 5pm  After hours call [phone]

## 2017-05-26 DIAGNOSIS — M6281 Muscle weakness (generalized): Secondary | ICD-10-CM | POA: Diagnosis not present

## 2017-05-26 DIAGNOSIS — F0390 Unspecified dementia without behavioral disturbance: Secondary | ICD-10-CM | POA: Diagnosis not present

## 2017-05-27 DIAGNOSIS — F0390 Unspecified dementia without behavioral disturbance: Secondary | ICD-10-CM | POA: Diagnosis not present

## 2017-05-27 DIAGNOSIS — M6281 Muscle weakness (generalized): Secondary | ICD-10-CM | POA: Diagnosis not present

## 2017-05-28 DIAGNOSIS — M6281 Muscle weakness (generalized): Secondary | ICD-10-CM | POA: Diagnosis not present

## 2017-05-28 DIAGNOSIS — F0390 Unspecified dementia without behavioral disturbance: Secondary | ICD-10-CM | POA: Diagnosis not present

## 2017-05-31 DIAGNOSIS — M6281 Muscle weakness (generalized): Secondary | ICD-10-CM | POA: Diagnosis not present

## 2017-05-31 DIAGNOSIS — F0390 Unspecified dementia without behavioral disturbance: Secondary | ICD-10-CM | POA: Diagnosis not present

## 2017-06-01 DIAGNOSIS — F0390 Unspecified dementia without behavioral disturbance: Secondary | ICD-10-CM | POA: Diagnosis not present

## 2017-06-01 DIAGNOSIS — M6281 Muscle weakness (generalized): Secondary | ICD-10-CM | POA: Diagnosis not present

## 2017-06-02 DIAGNOSIS — M6281 Muscle weakness (generalized): Secondary | ICD-10-CM | POA: Diagnosis not present

## 2017-06-02 DIAGNOSIS — F0390 Unspecified dementia without behavioral disturbance: Secondary | ICD-10-CM | POA: Diagnosis not present

## 2017-06-09 ENCOUNTER — Encounter: Payer: Self-pay | Admitting: Adult Health

## 2017-06-09 ENCOUNTER — Non-Acute Institutional Stay (SKILLED_NURSING_FACILITY): Payer: Medicare Other | Admitting: Adult Health

## 2017-06-09 DIAGNOSIS — K219 Gastro-esophageal reflux disease without esophagitis: Secondary | ICD-10-CM

## 2017-06-09 DIAGNOSIS — F0150 Vascular dementia without behavioral disturbance: Secondary | ICD-10-CM

## 2017-06-09 DIAGNOSIS — Z23 Encounter for immunization: Secondary | ICD-10-CM | POA: Diagnosis not present

## 2017-06-09 DIAGNOSIS — E1169 Type 2 diabetes mellitus with other specified complication: Secondary | ICD-10-CM | POA: Diagnosis not present

## 2017-06-09 DIAGNOSIS — E785 Hyperlipidemia, unspecified: Secondary | ICD-10-CM

## 2017-06-09 DIAGNOSIS — F0152 Vascular dementia, unspecified severity, with psychotic disturbance: Secondary | ICD-10-CM

## 2017-06-09 DIAGNOSIS — J441 Chronic obstructive pulmonary disease with (acute) exacerbation: Secondary | ICD-10-CM | POA: Diagnosis not present

## 2017-06-09 DIAGNOSIS — E876 Hypokalemia: Secondary | ICD-10-CM

## 2017-06-09 NOTE — Progress Notes (Signed)
Location:   [place] Room Number: [place] of Service:  SNF (31)   CODE STATUS:Full Code  Allergies  Allergen Reactions  . Avelox [Moxifloxacin Hcl In Nacl] Hives  . Codeine     Reports her throat swelled in the past but has tolerated since.  . Compazine [Prochlorperazine Edisylate] Nausea And Vomiting  . Morphine And Related     Patient doesn't recall  . Mycobacterium   . Penicillins     Patient doesn't recall.  . Phenothiazines     Patient doesn't recall  . Prednisone     Insomnia, confusion.  Marland Kitchen Reglan [Metoclopramide]     Patient doesn't recall "but it was awful"  . Sulfonamide Derivatives Hives    Chief Complaint  Patient presents with  . Medical Management of Chronic Issues    Copd; dyslipidemia; hypokalemia; dementia; gerd     HPI:  She is a 76 year old long term resident of this facility being seen for the management of her chronic illnesses: COPD; dyslipidemia associated with type 2 diabetes mellitus; gerd without esophagitis; vascular dementia with paranoia; hypokalemia. She denies any chest pain; no cough no shortness of breath present. There are reports of any changes in behaviors; no reports of changes in appetite. There are no nursing concerns at this time.   Past Medical History:  Diagnosis Date  . Abnormality of gait   . Anxiety state, unspecified   . Candidiasis of other urogenital sites   . Chronic airway obstruction, not elsewhere classified   . Chronic obstructive lung disease ([place])   . Depressive disorder, not elsewhere classified   . Esophageal reflux   . Hyperlipemia   . Lumbago   . Osteoporosis   . Spinal stenosis   . 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 Unspecified dementia without behavioral disturbance     Past Surgical History:  Procedure Laterality Date  . ABDOMINAL HYSTERECTOMY    . APPENDECTOMY    . CHOLECYSTECTOMY    . VERTEBROPLASTY      Social History   Social History  .  Marital status: Divorced    Spouse name: N/A  . Number of children: N/A  . Years of education: N/A   Occupational History  . Not on file.   Social History Main Topics  . Smoking status: Former Smoker    Packs/day: 1.00    Years: 20.00  . Smokeless tobacco: Never Used  . Alcohol use No  . Drug use: No  . Sexual activity: No   Other Topics Concern  . Not on file   Social History Narrative  . No narrative on file   History reviewed. No pertinent family history.    VITAL SIGNS BP 116/70   Pulse 70   Temp (!) 96.8 F (36 C)   Resp 18   Ht 5\' 2"  (1.575 m)   Wt 176 lb 3.2 oz (79.9 kg)   SpO2 93%   BMI 32.23 kg/m   Patient's Medications  New Prescriptions   No medications on file  Previous Medications   ACETAMINOPHEN (TYLENOL) 325 MG TABLET    Take 325 mg by mouth every 4 (four) hours as needed (pain).    ASPIRIN EC 81 MG TABLET    Take 1 tablet (81 mg total) by mouth daily.   CHOLECALCIFEROL 1000 UNITS TABLET    Take 2,000 Units by mouth at bedtime.    DULOXETINE (CYMBALTA) 60 MG CAPSULE    Take  60 mg by mouth daily. For depression   FLUTICASONE (FLONASE) 50 MCG/ACT NASAL SPRAY    Place 2 sprays into both nostrils at bedtime.   FLUTICASONE-SALMETEROL (ADVAIR) 250-50 MCG/DOSE AEPB    Inhale 1 puff into the lungs 2 (two) times daily. For COPD   GABAPENTIN (NEURONTIN) 100 MG CAPSULE    Take 1 capsule (100 mg total) by mouth 3 (three) times daily.   GUAIFENESIN (MUCINEX MAXIMUM STRENGTH) 1200 MG TB12    Take 1 tablet by mouth 2 (two) times daily.    INSULIN GLARGINE (LANTUS) 100 UNIT/ML INJECTION    Inject 20 Units into the skin at bedtime.    LINAGLIPTIN (TRADJENTA) 5 MG TABS TABLET    Take 5 mg by mouth daily.   MEMANTINE HCL-DONEPEZIL HCL (NAMZARIC) 28-10 MG CP24    Take 1 capsule by mouth every evening. For dementia   RANITIDINE (ZANTAC) 150 MG TABLET    Take 150 mg by mouth 2 (two) times daily. In the morning and at bedtime For reflux   TRAZODONE (DESYREL) 25 MG TABS  TABLET    Take 25 mg by mouth at bedtime.    UMECLIDINIUM BROMIDE (INCRUSE ELLIPTA) 62.5 MCG/INH AEPB    Inhale 2 puffs into the lungs daily.  Modified Medications   No medications on file  Discontinued Medications   No medications on file     SIGNIFICANT DIAGNOSTIC EXAMS  NO NEW EXAMS    LABS REVIEWED: PREVIOUS    05-21-16: wbc 9.4; hgb 13.0; hct 39.9 ;mcv 86.5; plt 355; glucose 200; bun 13.0; creat 0.61; k+ 4.4 ;na++ 138; liver normal albumin 4.3; hgb a1c 9.0 chol 158; ldl 90; trig 81; hdl 52  06-25-16: urine micro-albumin: 1.2  09-21-16: hgb a1c 8.6 09-25-16: hgb a1c 8.4 12-26-16: wbc 10.2; hgb 13; hct 39; mcv 87.0 plt 327; glucose 112; bun 14; creat 0.5; k+ 4.1; na++ 141; liver normal hgb a1c 7.3  12-29-16: urine culture: ESBL e-coli  03-20-17: tsh 3.10 03-22-17: vit B 12: 357; folate 9.8  NO NEW LABS    Review of Systems  Constitutional: Negative for malaise/fatigue.  Eyes: Negative for pain.  Respiratory: Negative for cough and shortness of breath.   Cardiovascular: Negative for chest pain, palpitations and leg swelling.  Gastrointestinal: Negative for abdominal pain, constipation and heartburn.  Musculoskeletal: Negative for back pain, joint pain and myalgias.  Skin: Negative.   Neurological: Negative for dizziness.  Psychiatric/Behavioral: The patient is not nervous/anxious.     Physical Exam  Constitutional: She appears well-developed and well-nourished. No distress.  Eyes: Conjunctivae are normal.  Neck: Neck supple. No thyromegaly present.  Cardiovascular: Normal rate, regular rhythm and intact distal pulses.  Murmur heard. 1/6  Pulmonary/Chest: Effort normal and breath sounds normal. No respiratory distress.  Abdominal: Soft. Bowel sounds are normal. She exhibits no distension. There is no tenderness.  Musculoskeletal: She exhibits no edema.  Is able to move all extremities   Lymphadenopathy:    She has no cervical adenopathy.  Neurological: She is alert.    Skin: Skin is warm and dry. She is not diaphoretic.  Psychiatric: She has a normal mood and affect.   ASSESSMENT/ PLAN:  TODAY:   1. COPD: stable  will continue advair 250/50 twice daily;mucinex 1200 mg  twice daily ; Ellipts 2 puffs daily    2. Dyslipidemia: stable ldl is 90; trig 52 is currently off medications; due to some weight loss.     3. Hypokalemia:stable is currently off supplement  4. Vascular dementia: without change in her status; will continue namzaric 28-10 mg daily and will monitor her weight is 176 pounds.   5. Gerd: stable  will continue zantac 150 mg twice daily   PREVIOUS     6. Chronic low back pain: she is presently stable; will continue lidoderm 2 patches to lower back daily; neurontin 100 mg three times daily; cymbalta 60 mg daily  will monitor her status.   7. Anxiety: she is stable and is benefiting from cymbalta 60 mg daily will not make changes will continue trazodone 25 mg nightly for sleep.   8. Diabetes: her hgb a1c is 8.4. Stable Will continue  tradjenta 5  mg daily will change to lantus 20 units nightly  Is on asa ; could not tolerate low does ace   9.  Hypertension: b/p 116/70 stable   will continue asa 81 mg daily    Could not tolerate ACE at low dose will monitor   Will check cbc; cmp; lipids; hgb a1c urine micro-albumin   MD is aware of resident's narcotic use and is in agreement with current plan of care. We will attempt to wean resident as apropriate     Ok Edwards NP [facility]  Contact [phone] Monday through Friday 8am- 5pm  After hours call [phone]

## 2017-06-10 DIAGNOSIS — I10 Essential (primary) hypertension: Secondary | ICD-10-CM | POA: Diagnosis not present

## 2017-06-10 DIAGNOSIS — E0840 Diabetes mellitus due to underlying condition with diabetic neuropathy, unspecified: Secondary | ICD-10-CM | POA: Diagnosis not present

## 2017-06-10 DIAGNOSIS — D649 Anemia, unspecified: Secondary | ICD-10-CM | POA: Diagnosis not present

## 2017-06-10 DIAGNOSIS — E11628 Type 2 diabetes mellitus with other skin complications: Secondary | ICD-10-CM | POA: Diagnosis not present

## 2017-06-10 DIAGNOSIS — E785 Hyperlipidemia, unspecified: Secondary | ICD-10-CM | POA: Diagnosis not present

## 2017-06-10 LAB — CBC AND DIFFERENTIAL
HEMATOCRIT: 42 (ref 36–46)
HEMOGLOBIN: 13.1 (ref 12.0–16.0)
Neutrophils Absolute: 8
PLATELETS: 288 (ref 150–399)
WBC: 11.4

## 2017-06-10 LAB — HEMOGLOBIN A1C: Hemoglobin A1C: 7.2

## 2017-06-10 LAB — HEPATIC FUNCTION PANEL
ALT: 7 (ref 7–35)
AST: 8 — AB (ref 13–35)
Alkaline Phosphatase: 94 (ref 25–125)
BILIRUBIN, TOTAL: 0.2

## 2017-06-10 LAB — LIPID PANEL
CHOLESTEROL: 153 (ref 0–200)
HDL: 39 (ref 35–70)
LDL Cholesterol: 79
TRIGLYCERIDES: 176 — AB (ref 40–160)

## 2017-06-10 LAB — BASIC METABOLIC PANEL
BUN: 10 (ref 4–21)
Creatinine: 0.6 (ref 0.5–1.1)
Glucose: 109
Potassium: 4.1 (ref 3.4–5.3)
Sodium: 144 (ref 137–147)

## 2017-06-10 LAB — MICROALBUMIN, URINE: MICROALB UR: 3.2

## 2017-06-22 ENCOUNTER — Encounter: Payer: Self-pay | Admitting: Adult Health

## 2017-06-22 ENCOUNTER — Non-Acute Institutional Stay (SKILLED_NURSING_FACILITY): Payer: Medicare Other | Admitting: Adult Health

## 2017-06-22 DIAGNOSIS — H1033 Unspecified acute conjunctivitis, bilateral: Secondary | ICD-10-CM

## 2017-06-22 DIAGNOSIS — F329 Major depressive disorder, single episode, unspecified: Secondary | ICD-10-CM | POA: Diagnosis not present

## 2017-06-22 DIAGNOSIS — L03213 Periorbital cellulitis: Secondary | ICD-10-CM

## 2017-06-22 DIAGNOSIS — F0390 Unspecified dementia without behavioral disturbance: Secondary | ICD-10-CM | POA: Diagnosis not present

## 2017-06-22 DIAGNOSIS — G4700 Insomnia, unspecified: Secondary | ICD-10-CM | POA: Diagnosis not present

## 2017-06-22 NOTE — Progress Notes (Signed)
Location:   [place] Room Number: [address] [place] of Service:  SNF (31)   CODE STATUS: Full Code  Allergies  Allergen Reactions  . Avelox [Moxifloxacin Hcl In Nacl] Hives  . Codeine     Reports her throat swelled in the past but has tolerated since.  . Compazine [Prochlorperazine Edisylate] Nausea And Vomiting  . Morphine And Related     Patient doesn't recall  . Mycobacterium   . Penicillins     Patient doesn't recall.  . Phenothiazines     Patient doesn't recall  . Prednisone     Insomnia, confusion.  Marland Kitchen Reglan [Metoclopramide]     Patient doesn't recall "but it was awful"  . Sulfonamide Derivatives Hives    Chief Complaint  Patient presents with  . Acute Visit    Bilateral eye pain and redness    HPI:  She is having bilateral eye pain and redness present. Her peri-orbital redness and inflammation present. There are no reports of fever present. She tells me that she has eye drainage at times. She tells me that her eye pain and irritation are preventing her from sleeping well at night.   Past Medical History:  Diagnosis Date  . Abnormality of gait   . Anxiety state, unspecified   . Candidiasis of other urogenital sites   . Chronic airway obstruction, not elsewhere classified   . Chronic obstructive lung disease ([place])   . Depressive disorder, not elsewhere classified   . Esophageal reflux   . Hyperlipemia   . Lumbago   . Osteoporosis   . Spinal stenosis   . 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 Unspecified dementia without behavioral disturbance     Past Surgical History:  Procedure Laterality Date  . ABDOMINAL HYSTERECTOMY    . APPENDECTOMY    . CHOLECYSTECTOMY    . VERTEBROPLASTY      Social History   Socioeconomic History  . Marital status: Divorced    Spouse name: Not on file  . Number of children: Not on file  . Years of education: Not on file  . Highest education level: Not on file  Social Needs  .  Financial resource strain: Not on file  . Food insecurity - worry: Not on file  . Food insecurity - inability: Not on file  . Transportation needs - medical: Not on file  . Transportation needs - non-medical: Not on file  Occupational History  . Not on file  Tobacco Use  . Smoking status: Former Smoker    Packs/day: 1.00    Years: 20.00    Pack years: 20.00  . Smokeless tobacco: Never Used  Substance and Sexual Activity  . Alcohol use: No  . Drug use: No  . Sexual activity: No  Other Topics Concern  . Not on file  Social History Narrative  . Not on file   History reviewed. No pertinent family history.    VITAL SIGNS BP 138/82   Pulse 84   Temp (!) 97.5 F (36.4 C)   Resp 20   Ht 5\' 2"  (1.575 m)   Wt 178 lb 3.2 oz (80.8 kg)   SpO2 93%   BMI 32.59 kg/m      Medication List        Accurate as of 06/22/17 11:44 AM. Always use your most recent med list.          acetaminophen 325 MG tablet Commonly known as:  TYLENOL  aspirin EC 81 MG tablet Take 1 tablet (81 mg total) by mouth daily.   Cholecalciferol 1000 units tablet   DULoxetine 60 MG capsule Commonly known as:  CYMBALTA   fluticasone 50 MCG/ACT nasal spray Commonly known as:  FLONASE   Fluticasone-Salmeterol 250-50 MCG/DOSE Aepb Commonly known as:  ADVAIR   gabapentin 100 MG capsule Commonly known as:  NEURONTIN Take 1 capsule (100 mg total) by mouth 3 (three) times daily.   INCRUSE ELLIPTA 62.5 MCG/INH Aepb Generic drug:  umeclidinium bromide   LANTUS 100 UNIT/ML injection Generic drug:  insulin glargine   linagliptin 5 MG Tabs tablet Commonly known as:  TRADJENTA   MUCINEX MAXIMUM STRENGTH 1200 MG Tb12 Generic drug:  Guaifenesin   NAMZARIC 28-10 MG Cp24 Generic drug:  Memantine HCl-Donepezil HCl   ranitidine 150 MG tablet Commonly known as:  ZANTAC   traZODone 25 mg Tabs tablet Commonly known as:  DESYREL        SIGNIFICANT DIAGNOSTIC EXAMS   NO NEW EXAMS    LABS  REVIEWED: PREVIOUS    06-25-16: urine micro-albumin: 1.2  09-21-16: hgb a1c 8.6 09-25-16: hgb a1c 8.4 12-26-16: wbc 10.2; hgb 13; hct 39; mcv 87.0 plt 327; glucose 112; bun 14; creat 0.5; k+ 4.1; na++ 141; liver normal hgb a1c 7.3  12-29-16: urine culture: ESBL e-coli  03-20-17: tsh 3.10 03-22-17: vit B 12: 357; folate 9.8  NO NEW LABS    Review of Systems  Constitutional: Negative for malaise/fatigue.  Eyes: Positive for pain, discharge and redness.  Respiratory: Negative for cough and shortness of breath.   Cardiovascular: Negative for chest pain, palpitations and leg swelling.  Gastrointestinal: Negative for abdominal pain, constipation and heartburn.  Musculoskeletal: Negative for back pain, joint pain and myalgias.  Skin: Negative.   Neurological: Negative for dizziness.  Psychiatric/Behavioral: The patient is not nervous/anxious.     Physical Exam  Constitutional: She appears well-developed and well-nourished. No distress.  Eyes:  Bilateral eye redness with drainage present Peri-orbital redness and inflammation present   Neck: Neck supple. No thyromegaly present.  Cardiovascular: Normal rate, regular rhythm and intact distal pulses.  Murmur heard. Pulmonary/Chest: Effort normal and breath sounds normal. No respiratory distress.  Abdominal: Soft. Bowel sounds are normal. She exhibits no distension. There is no tenderness.  Musculoskeletal: Normal range of motion. She exhibits no edema.  Lymphadenopathy:    She has no cervical adenopathy.  Neurological: She is alert.  Skin: Skin is warm. She is not diaphoretic.  Psychiatric: She has a normal mood and affect.    ASSESSMENT/ PLAN:   1. Bilateral acute conjunctivitis and peri-orbital cellulitis: will being tobramycin 0.3% 2 drops both eyes three times daily for 10 day; and will begin doxycycline 100 mg twice daily for 10 days and will monitor  MD is aware of resident's narcotic use and is in agreement with current plan of  care. We will attempt to wean resident as apropriate   Ok Edwards NP [facility]  Contact [phone] Monday through Friday 8am- 5pm  After hours call [phone]

## 2017-07-04 DIAGNOSIS — H1033 Unspecified acute conjunctivitis, bilateral: Secondary | ICD-10-CM | POA: Insufficient documentation

## 2017-07-04 DIAGNOSIS — L03213 Periorbital cellulitis: Secondary | ICD-10-CM | POA: Insufficient documentation

## 2017-07-05 ENCOUNTER — Encounter: Payer: Self-pay | Admitting: Adult Health

## 2017-07-05 ENCOUNTER — Encounter: Payer: Self-pay | Admitting: Internal Medicine

## 2017-07-05 ENCOUNTER — Non-Acute Institutional Stay (SKILLED_NURSING_FACILITY): Payer: Medicare Other | Admitting: Internal Medicine

## 2017-07-05 DIAGNOSIS — Z794 Long term (current) use of insulin: Secondary | ICD-10-CM

## 2017-07-05 DIAGNOSIS — F0150 Vascular dementia without behavioral disturbance: Secondary | ICD-10-CM | POA: Diagnosis not present

## 2017-07-05 DIAGNOSIS — E1169 Type 2 diabetes mellitus with other specified complication: Secondary | ICD-10-CM | POA: Diagnosis not present

## 2017-07-05 DIAGNOSIS — G8929 Other chronic pain: Secondary | ICD-10-CM

## 2017-07-05 DIAGNOSIS — F411 Generalized anxiety disorder: Secondary | ICD-10-CM | POA: Diagnosis not present

## 2017-07-05 DIAGNOSIS — F0152 Vascular dementia, unspecified severity, with psychotic disturbance: Secondary | ICD-10-CM

## 2017-07-05 DIAGNOSIS — M545 Low back pain, unspecified: Secondary | ICD-10-CM

## 2017-07-05 DIAGNOSIS — E785 Hyperlipidemia, unspecified: Secondary | ICD-10-CM | POA: Diagnosis not present

## 2017-07-05 DIAGNOSIS — H04129 Dry eye syndrome of unspecified lacrimal gland: Secondary | ICD-10-CM | POA: Diagnosis not present

## 2017-07-05 DIAGNOSIS — J449 Chronic obstructive pulmonary disease, unspecified: Secondary | ICD-10-CM | POA: Diagnosis not present

## 2017-07-05 DIAGNOSIS — E118 Type 2 diabetes mellitus with unspecified complications: Secondary | ICD-10-CM | POA: Diagnosis not present

## 2017-07-05 NOTE — Progress Notes (Signed)
Entered in error

## 2017-07-05 NOTE — Progress Notes (Signed)
Patient ID: [name] [name], female   DOB: 11/28/1940, 76 y.o.   MRN: [id number]    DATE: 07/05/17  Location:    [facility] Room Number: 106Y Place of Service: SNF (31)   Extended Emergency Contact Information Primary Emergency Contact: Blanche East) Address: [address]          [name] Phone: [phone] Relation: Son Secondary Emergency Contact: Charletta Cousin [place] Phone: [phone] Work Phone: [phone] Relation: Son  Advanced Directive information  [place]  Chief Complaint  Patient presents with  . Medical Management of Chronic Issues    1 month follow up    HPI:  76 yo female long term resident seen today for f/u. She is c/a dry eyes. No change in vision. She was tx for conjunctivitis x 3 since Aug 2018.  No nursing issues. No falls. Appetite is good and she sleeps well. She is a poor historian due to dementia. Hx obtained from chart. CBGs occasionally 100-150s, occasionally 90s.  COPD - stable on advair 250/50 twice daily; mucinex 1200 mg  twice daily ; Ellipta inhale 2 puffs daily    Dyslipidemia - diet controlled. LDL 79; Tchol 153; TG 176    Hx Hypokalemia - resolved  Vascular dementia - unchanged. Takes namzaric 28-10 mg daily. Albumin 3.8. Weight is stable  GERD - stable on zantac 150 mg twice daily  Chronic low back pain - stable on lidoderm 2 patches to lower back daily; neurontin 100 mg three times daily; cymbalta 60 mg daily  Anxiety - mood stable on cymbalta 60 mg daily;  trazodone 25 mg nightly for sleep.   DM - controlled. A1c 7.2%. She takes tradjenta 5 mg daily; lantus 20 units nightly; ASA daily. She was unable to tolerate low dose ACEI. Urine microalbumin/Cr ratio 30.7. She has neuropathy and takes gabapentin  Hypertension - diet controlled. Takes ASA 81 mg daily    Past Medical History:  Diagnosis Date  . Abnormality of gait   . Anxiety state,  unspecified   . Candidiasis of other urogenital sites   . Chronic airway obstruction, not elsewhere classified   . Chronic obstructive lung disease ([place])   . Depressive disorder, not elsewhere classified   . Esophageal reflux   . Hyperlipemia   . Lumbago   . Osteoporosis   . Spinal stenosis   . 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 Unspecified dementia without behavioral disturbance     Past Surgical History:  Procedure Laterality Date  . ABDOMINAL HYSTERECTOMY    . APPENDECTOMY    . CHOLECYSTECTOMY    . VERTEBROPLASTY      Patient Care Team: Gildardo Cranker, DO as PCP - General (Internal Medicine) Nyoka Cowden Phylis Bougie, NP as Nurse Practitioner ([place]) [facility], [place] ([place])  Social History   Socioeconomic History  . Marital status: Divorced    Spouse name: Not on file  . Number of children: Not on file  . Years of education: Not on file  . Highest education level: Not on file  Social Needs  . Financial resource strain: Not on file  . Food insecurity - worry: Not on file  . Food insecurity - inability: Not on file  . Transportation needs - medical: Not on file  . Transportation needs - non-medical: Not on file  Occupational History  . Not on file  Tobacco Use  . Smoking status:  Former Smoker    Packs/day: 1.00    Years: 20.00    Pack years: 20.00  . Smokeless tobacco: Never Used  Substance and Sexual Activity  . Alcohol use: No  . Drug use: No  . Sexual activity: No  Other Topics Concern  . Not on file  Social History Narrative  . Not on file     reports that she has quit smoking. She has a 20.00 pack-year smoking history. she has never used smokeless tobacco. She reports that she does not drink alcohol or use drugs.  History reviewed. Unable to obtain FHx 2/2 advanced dementia No family status information on file.    Immunization History  Administered Date(s) Administered  . Influenza  Whole 05/22/2013  . Influenza-Unspecified 05/23/2014, 05/20/2015, 06/27/2016, 06/09/2017  . PPD Test 05/01/2016, 05/08/2016  . Pneumococcal-Unspecified 08/31/2011    Allergies  Allergen Reactions  . Avelox [Moxifloxacin Hcl In Nacl] Hives  . Codeine     Reports her throat swelled in the past but has tolerated since.  . Compazine [Prochlorperazine Edisylate] Nausea And Vomiting  . Morphine And Related     Patient doesn't recall  . Mycobacterium   . Penicillins     Patient doesn't recall.  . Phenothiazines     Patient doesn't recall  . Prednisone     Insomnia, confusion.  Marland Kitchen Reglan [Metoclopramide]     Patient doesn't recall "but it was awful"  . Sulfonamide Derivatives Hives    Medications:   Medication List        Accurate as of 07/05/17  4:32 PM. Always use your most recent med list.          acetaminophen 325 MG tablet Commonly known as:  TYLENOL   aspirin EC 81 MG tablet Take 1 tablet (81 mg total) by mouth daily.   Cholecalciferol 1000 units tablet   DULoxetine 60 MG capsule Commonly known as:  CYMBALTA   fluticasone 50 MCG/ACT nasal spray Commonly known as:  FLONASE   Fluticasone-Salmeterol 250-50 MCG/DOSE Aepb Commonly known as:  ADVAIR   gabapentin 100 MG capsule Commonly known as:  NEURONTIN Take 1 capsule (100 mg total) by mouth 3 (three) times daily.   INCRUSE ELLIPTA 62.5 MCG/INH Aepb Generic drug:  umeclidinium bromide   LANTUS 100 UNIT/ML injection Generic drug:  insulin glargine   linagliptin 5 MG Tabs tablet Commonly known as:  TRADJENTA   MUCINEX MAXIMUM STRENGTH 1200 MG Tb12 Generic drug:  Guaifenesin   NAMZARIC 28-10 MG Cp24 Generic drug:  Memantine HCl-Donepezil HCl   ranitidine 150 MG tablet Commonly known as:  ZANTAC   tobramycin 0.3 % ophthalmic solution Commonly known as:  TOBREX   traZODone 25 mg Tabs tablet Commonly known as:  DESYREL       Review of Systems  Unable to perform ROS: Dementia    Vitals:    07/05/17 1343  BP: 126/76  Pulse: 88  Temp: (!) 97.4 F (36.3 C)  Weight: 178 lb 3.2 oz (80.8 kg)  Height: 5\' 2"  (1.575 m)   Body mass index is 32.59 kg/m.  Physical Exam  Constitutional: She appears well-developed.  Frail appearing, lying in bed in NAD  HENT:  Mouth/Throat: Oropharynx is clear and moist. No oropharyngeal exudate.  MMM; no oral thrush  Eyes: Pupils are equal, round, and reactive to light. No scleral icterus.  Left lid stuck and OU appear dry. No d/c  Neck: Neck supple. Carotid bruit is not present. No tracheal deviation present. No thyromegaly  present.  Cardiovascular: Normal rate, regular rhythm and intact distal pulses. Exam reveals no gallop and no friction rub.  Murmur (1/6 SEM) heard. No LE edema b/l. no calf TTP.   Pulmonary/Chest: Effort normal. No stridor. No respiratory distress. She has wheezes (end expiratory b/l.). She has no rales.  Abdominal: Soft. Normal appearance and bowel sounds are normal. She exhibits no distension and no mass. There is no hepatomegaly. There is no tenderness. There is no rigidity, no rebound and no guarding. No hernia.  Musculoskeletal: She exhibits edema.  Lymphadenopathy:    She has no cervical adenopathy.  Neurological: She is alert.  Skin: Skin is warm and dry. No rash noted.  Psychiatric: She has a normal mood and affect. Her behavior is normal. Thought content normal.   Diabetic Foot Exam - Simple   Simple Foot Form Diabetic Foot exam was performed with the following findings:  Yes 07/05/2017 12:52 PM  Visual Inspection See comments:  Yes Sensation Testing Pulse Check Posterior Tibialis and Dorsalis pulse intact bilaterally:  Yes Comments Right 1st toe flexed and under 2nd toe; b/l hammertoes. No calluses or ulcerations       Labs reviewed: Abstract on 06/11/2017  Component Date Value Ref Range Status  . Microalb, Ur 06/10/2017 3.2   Final  Abstract on 06/11/2017  Component Date Value Ref Range Status  .  Hemoglobin 06/10/2017 13.1  12.0 - 16.0 Final  . HCT 06/10/2017 42  36 - 46 Final  . Neutrophils Absolute 06/10/2017 8   Final  . Platelets 06/10/2017 288  150 - 399 Final  . WBC 06/10/2017 11.4   Final  . Glucose 06/10/2017 109   Final  . BUN 06/10/2017 10  4 - 21 Final  . Creatinine 06/10/2017 0.6  0.5 - 1.1 Final  . Potassium 06/10/2017 4.1  3.4 - 5.3 Final  . Sodium 06/10/2017 144  137 - 147 Final  . Triglycerides 06/10/2017 176* 40 - 160 Final  . Cholesterol 06/10/2017 153  0 - 200 Final  . HDL 06/10/2017 39  35 - 70 Final  . LDL Cholesterol 06/10/2017 79   Final  . Alkaline Phosphatase 06/10/2017 94  25 - 125 Final  . ALT 06/10/2017 7  7 - 35 Final  . AST 06/10/2017 8* 13 - 35 Final  . Bilirubin, Total 06/10/2017 0.2   Final  . Hemoglobin A1C 06/10/2017 7.2   Final    No results found.   Assessment/Plan   ICD-10-CM   1. Dry eye - in OU; NEW H04.129   2. Vascular dementia with paranoia F01.50   3. Anxiety state F41.1   4. Chronic obstructive pulmonary disease, unspecified COPD type ([place]) J44.9   5. Chronic low back pain without sciatica, unspecified back pain laterality M54.5    G89.29   6. Type 2 diabetes mellitus with complication, with long-term current use of insulin (HCC) E11.8    Z79.4   7. Hyperlipidemia associated with type 2 diabetes mellitus ([place]) E11.69    E78.5    START ARTIFICIAL TEARS 2 gtts OU qshift  Cont other meds as ordered  PT/Ot/ST as indicated  Will follow  Job Holtsclaw S. Perlie Gold  [facility] [address] [place], [place] [address] [phone] Cell (Monday-Friday 8 AM - 5 PM) [phone] After 5 PM and follow prompts

## 2017-07-09 ENCOUNTER — Encounter: Payer: Self-pay | Admitting: Internal Medicine

## 2017-07-09 DIAGNOSIS — H04129 Dry eye syndrome of unspecified lacrimal gland: Secondary | ICD-10-CM | POA: Insufficient documentation

## 2017-07-30 ENCOUNTER — Non-Acute Institutional Stay (SKILLED_NURSING_FACILITY): Payer: Medicare Other | Admitting: Adult Health

## 2017-07-30 ENCOUNTER — Encounter: Payer: Self-pay | Admitting: Adult Health

## 2017-07-30 DIAGNOSIS — M545 Low back pain, unspecified: Secondary | ICD-10-CM

## 2017-07-30 DIAGNOSIS — E118 Type 2 diabetes mellitus with unspecified complications: Secondary | ICD-10-CM | POA: Diagnosis not present

## 2017-07-30 DIAGNOSIS — Z794 Long term (current) use of insulin: Secondary | ICD-10-CM

## 2017-07-30 DIAGNOSIS — F339 Major depressive disorder, recurrent, unspecified: Secondary | ICD-10-CM

## 2017-07-30 DIAGNOSIS — E1159 Type 2 diabetes mellitus with other circulatory complications: Secondary | ICD-10-CM | POA: Diagnosis not present

## 2017-07-30 DIAGNOSIS — I10 Essential (primary) hypertension: Secondary | ICD-10-CM

## 2017-07-30 DIAGNOSIS — G8929 Other chronic pain: Secondary | ICD-10-CM | POA: Diagnosis not present

## 2017-07-30 NOTE — Progress Notes (Signed)
Location:   [facility] Room Number: 638 G YKZLD of Service:  SNF (31)   CODE STATUS: Full Code  Allergies  Allergen Reactions  . Avelox [Moxifloxacin Hcl In Nacl] Hives  . Codeine     Reports her throat swelled in the past but has tolerated since.  . Compazine [Prochlorperazine Edisylate] Nausea And Vomiting  . Morphine And Related     Patient doesn't recall  . Mycobacterium   . Penicillins     Patient doesn't recall.  . Phenothiazines     Patient doesn't recall  . Prednisone     Insomnia, confusion.  Marland Kitchen Reglan [Metoclopramide]     Patient doesn't recall "but it was awful"  . Sulfonamide Derivatives Hives    Chief Complaint  Patient presents with  . Medical Management of Chronic Issues    Chronic low back pain; anxiety; diabetes and hypertension    HPI:  She is a 76 year old long term resident of this facility being seen for the management of her chronic illnesses: hypertension associated with diabetes; type 2 diabetes mellitus with complication with long term current sue of insulin; chronic low back pain without sciatica; and major depressive disorder recurrent episode.   Past Medical History:  Diagnosis Date  . Abnormality of gait   . Anxiety state, unspecified   . Candidiasis of other urogenital sites   . Chronic airway obstruction, not elsewhere classified   . Chronic obstructive lung disease ([place])   . Depressive disorder, not elsewhere classified   . Esophageal reflux   . Hyperlipemia   . Lumbago   . Osteoporosis   . Spinal stenosis   . 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 Unspecified dementia without behavioral disturbance     Past Surgical History:  Procedure Laterality Date  . ABDOMINAL HYSTERECTOMY    . APPENDECTOMY    . CHOLECYSTECTOMY    . VERTEBROPLASTY      Social History   Socioeconomic History  . Marital status: Divorced    Spouse name: Not on file  . Number of children: Not on file  . Years of  education: Not on file  . Highest education level: Not on file  Social Needs  . Financial resource strain: Not on file  . Food insecurity - worry: Not on file  . Food insecurity - inability: Not on file  . Transportation needs - medical: Not on file  . Transportation needs - non-medical: Not on file  Occupational History  . Not on file  Tobacco Use  . Smoking status: Former Smoker    Packs/day: 1.00    Years: 20.00    Pack years: 20.00  . Smokeless tobacco: Never Used  Substance and Sexual Activity  . Alcohol use: No  . Drug use: No  . Sexual activity: No  Other Topics Concern  . Not on file  Social History Narrative  . Not on file   Family History  Family history unknown: Yes      VITAL SIGNS BP 105/61   Pulse 65   Temp (!) 97.2 F (36.2 C)   Resp 18   Ht 5\' 2"  (1.575 m)   Wt 178 lb 3.2 oz (80.8 kg)   SpO2 93%   BMI 32.59 kg/m   Outpatient Encounter Medications as of 07/30/2017  Medication Sig  . acetaminophen (TYLENOL) 325 MG tablet Take 325 mg by mouth every 4 (four) hours as needed (pain).   Marland Kitchen aspirin EC 81  MG tablet Take 1 tablet (81 mg total) by mouth daily.  . Cholecalciferol 1000 units tablet Take 2,000 Units by mouth at bedtime.   . DULoxetine (CYMBALTA) 60 MG capsule Take 60 mg by mouth daily. For depression  . fluticasone (FLONASE) 50 MCG/ACT nasal spray Place 2 sprays into both nostrils at bedtime.  . Fluticasone-Salmeterol (ADVAIR) 250-50 MCG/DOSE AEPB Inhale 1 puff into the lungs 2 (two) times daily. For COPD  . gabapentin (NEURONTIN) 100 MG capsule Take 1 capsule (100 mg total) by mouth 3 (three) times daily.  . Guaifenesin (MUCINEX MAXIMUM STRENGTH) 1200 MG TB12 Take 1 tablet by mouth 2 (two) times daily.   . Hypromellose 0.4 % SOLN Instill 2 drops in both eyes two times a day for dry eyes.  Instill one hour prior to Tobramycin drops  . Insulin Detemir (LEVEMIR FLEXTOUCH) 100 UNIT/ML Pen Inject 20 Units into the skin at bedtime.  Marland Kitchen linagliptin  (TRADJENTA) 5 MG TABS tablet Take 5 mg by mouth daily.  . Memantine HCl-Donepezil HCl (NAMZARIC) 28-10 MG CP24 Take 1 capsule by mouth every evening. For dementia  . ranitidine (ZANTAC) 150 MG tablet Take 150 mg by mouth 2 (two) times daily. In the morning and at bedtime For reflux  . traZODone (DESYREL) 25 mg TABS tablet Take 25 mg by mouth at bedtime.   Marland Kitchen umeclidinium bromide (INCRUSE ELLIPTA) 62.5 MCG/INH AEPB Inhale 2 puffs into the lungs daily.  . [DISCONTINUED] insulin glargine (LANTUS) 100 UNIT/ML injection Inject 20 Units into the skin at bedtime.    No facility-administered encounter medications on file as of 07/30/2017.      SIGNIFICANT DIAGNOSTIC EXAMS  NO NEW EXAMS    LABS REVIEWED: PREVIOUS    09-21-16: hgb a1c 8.6 09-25-16: hgb a1c 8.4 12-26-16: wbc 10.2; hgb 13; hct 39; mcv 87.0 plt 327; glucose 112; bun 14; creat 0.5; k+ 4.1; na++ 141; liver normal hgb a1c 7.3  12-29-16: urine culture: ESBL e-coli  03-20-17: tsh 3.10 03-22-17: vit B 12: 357; folate 9.8  TODAY:  06-10-17: wbc 11.4; hgb 13.1; hct 41.9; mcv 91.5; plt 288; glucose 109; bun 10.0; creat 0.56; k+ 4.1; na++ 144; ca 9.0; liver normal albumin 3.8; hgb a1c 7.2; chol 153; ldl 79; trig 176; hdl 39; urine micro-albumin 3.2     Review of Systems  Constitutional: Negative for malaise/fatigue.  Respiratory: Negative for cough and shortness of breath.   Cardiovascular: Negative for chest pain, palpitations and leg swelling.  Gastrointestinal: Negative for abdominal pain, constipation and heartburn.  Musculoskeletal: Negative for back pain, joint pain and myalgias.  Skin: Negative.   Neurological: Negative for dizziness.  Psychiatric/Behavioral: The patient is not nervous/anxious.    Physical Exam  Constitutional: She appears well-developed and well-nourished. No distress.  Eyes: Conjunctivae are normal.  Neck: No thyromegaly present.  Cardiovascular: Normal rate and regular rhythm.  Murmur heard. 1/6    Pulmonary/Chest: Effort normal and breath sounds normal. No respiratory distress.  Abdominal: Soft. Bowel sounds are normal. She exhibits no distension. There is no tenderness.  Musculoskeletal: Normal range of motion. She exhibits no edema.  Lymphadenopathy:    She has no cervical adenopathy.  Neurological: She is alert.  Skin: Skin is warm and dry. She is not diaphoretic.  Psychiatric: She has a normal mood and affect.    ASSESSMENT/ PLAN:  TODAY:   1. Chronic low back pain: she is presently stable; will continue  neurontin 100 mg three times daily; cymbalta 60 mg daily  will monitor  her status.   2. Major depression: she is stable and is benefiting from cymbalta 60 mg daily will not make changes will continue trazodone 25 mg nightly for sleep.   3. Diabetes: her hgb a1c is 7.2 (previous 8.4). Stable Will continue  tradjenta 5  mg daily levemir 20 units nightly  Is on asa ; could not tolerate low does ace   4.  Hypertension: b/p 105/61 stable   will continue asa 81 mg daily    Could not tolerate ACE at low dose will monitor  PREVIOUS     5. COPD: stable  will continue;mucinex 1200 mg  twice daily ; Ellipta 1  puff daily  flonase 2 puffs nightly   6. Dyslipidemia: stable ldl is 79; trig 176 is currently off medications; due to some weight loss.     7. Hypokalemia:stable is currently off supplement   8. Vascular dementia: without change in her status; will continue namzaric 28-10 mg daily and will monitor her weight is 178 pounds.   9. Gerd: stable  will continue zantac 150 mg twice daily  MD is aware of resident's narcotic use and is in agreement with current plan of care. We will attempt to wean resident as apropriate     Ok Edwards NP [facility]  Contact [phone] Monday through Friday 8am- 5pm  After hours call [phone]

## 2017-08-25 ENCOUNTER — Non-Acute Institutional Stay (SKILLED_NURSING_FACILITY): Payer: Medicare Other | Admitting: Adult Health

## 2017-08-25 ENCOUNTER — Encounter: Payer: Self-pay | Admitting: Adult Health

## 2017-08-25 DIAGNOSIS — F0150 Vascular dementia without behavioral disturbance: Secondary | ICD-10-CM

## 2017-08-25 DIAGNOSIS — J449 Chronic obstructive pulmonary disease, unspecified: Secondary | ICD-10-CM

## 2017-08-25 DIAGNOSIS — E1169 Type 2 diabetes mellitus with other specified complication: Secondary | ICD-10-CM

## 2017-08-25 DIAGNOSIS — E785 Hyperlipidemia, unspecified: Secondary | ICD-10-CM | POA: Diagnosis not present

## 2017-08-25 DIAGNOSIS — E876 Hypokalemia: Secondary | ICD-10-CM | POA: Diagnosis not present

## 2017-08-25 DIAGNOSIS — F0152 Vascular dementia, unspecified severity, with psychotic disturbance: Secondary | ICD-10-CM

## 2017-08-25 NOTE — Progress Notes (Signed)
Location:   [place] Room Number: 992 E QASTM of Service:  SNF (31)   CODE STATUS: Full Code  Allergies  Allergen Reactions  . Avelox [Moxifloxacin Hcl In Nacl] Hives  . Codeine     Reports her throat swelled in the past but has tolerated since.  . Compazine [Prochlorperazine Edisylate] Nausea And Vomiting  . Morphine And Related     Patient doesn't recall  . Mycobacterium   . Penicillins     Patient doesn't recall.  . Phenothiazines     Patient doesn't recall  . Prednisone     Insomnia, confusion.  Marland Kitchen Reglan [Metoclopramide]     Patient doesn't recall "but it was awful"  . Sulfonamide Derivatives Hives    Chief Complaint  Patient presents with  . Medical Management of Chronic Issues    Copd; hyperlipidemia; dementia; hypokalemia     HPI:  He is a 77 year old long term resident of this facility being seen for the management of her chronic illnesses; copd; hyperlipidemia; dementia; hypokalemia. She denies any chest pain; no shortness of breath no cough; denies any insomnia. There are no nursing concerns at this time.   Past Medical History:  Diagnosis Date  . Abnormality of gait   . Anxiety state, unspecified   . Candidiasis of other urogenital sites   . Chronic airway obstruction, not elsewhere classified   . Chronic obstructive lung disease ([place])   . Depressive disorder, not elsewhere classified   . Esophageal reflux   . Hyperlipemia   . Lumbago   . Osteoporosis   . Spinal stenosis   . 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 Unspecified dementia without behavioral disturbance     Past Surgical History:  Procedure Laterality Date  . ABDOMINAL HYSTERECTOMY    . APPENDECTOMY    . CHOLECYSTECTOMY    . VERTEBROPLASTY      Social History   Socioeconomic History  . Marital status: Divorced    Spouse name: Not on file  . Number of children: Not on file  . Years of education: Not on file  . Highest education level:  Not on file  Social Needs  . Financial resource strain: Not on file  . Food insecurity - worry: Not on file  . Food insecurity - inability: Not on file  . Transportation needs - medical: Not on file  . Transportation needs - non-medical: Not on file  Occupational History  . Not on file  Tobacco Use  . Smoking status: Former Smoker    Packs/day: 1.00    Years: 20.00    Pack years: 20.00  . Smokeless tobacco: Never Used  Substance and Sexual Activity  . Alcohol use: No  . Drug use: No  . Sexual activity: No  Other Topics Concern  . Not on file  Social History Narrative  . Not on file   Family History  Family history unknown: Yes      VITAL SIGNS BP (!) 102/59   Pulse 79   Temp (!) 97.2 F (36.2 C)   Resp 18   Ht 5\' 2"  (1.575 m)   Wt 178 lb 3.2 oz (80.8 kg)   BMI 32.59 kg/m   Outpatient Encounter Medications as of 08/25/2017  Medication Sig  . acetaminophen (TYLENOL) 325 MG tablet Take 325 mg by mouth every 4 (four) hours as needed (pain).   Marland Kitchen aspirin EC 81 MG tablet Take 1 tablet (81 mg total) by  mouth daily.  . Cholecalciferol 1000 units tablet Take 2,000 Units by mouth at bedtime.   . DULoxetine (CYMBALTA) 60 MG capsule Take 60 mg by mouth daily. For depression  . fluticasone (FLONASE) 50 MCG/ACT nasal spray Place 2 sprays into both nostrils at bedtime.  . gabapentin (NEURONTIN) 100 MG capsule Take 1 capsule (100 mg total) by mouth 3 (three) times daily.  . Guaifenesin (MUCINEX MAXIMUM STRENGTH) 1200 MG TB12 Take 1 tablet by mouth 2 (two) times daily.   . Hypromellose 0.4 % SOLN Instill 2 drops in both eyes two times a day for dry eyes.  Instill one hour prior to Tobramycin drops  . Insulin Detemir (LEVEMIR FLEXTOUCH) 100 UNIT/ML Pen Inject 20 Units into the skin at bedtime.  Marland Kitchen linagliptin (TRADJENTA) 5 MG TABS tablet Take 5 mg by mouth daily.  . Memantine HCl-Donepezil HCl (NAMZARIC) 28-10 MG CP24 Take 1 capsule by mouth every evening. For dementia  . ranitidine  (ZANTAC) 150 MG tablet Take 150 mg by mouth 2 (two) times daily. In the morning and at bedtime For reflux  . traZODone (DESYREL) 25 mg TABS tablet Take 25 mg by mouth at bedtime.   Marland Kitchen umeclidinium bromide (INCRUSE ELLIPTA) 62.5 MCG/INH AEPB Inhale 1 puff into the lungs daily.   . [DISCONTINUED] Fluticasone-Salmeterol (ADVAIR) 250-50 MCG/DOSE AEPB Inhale 1 puff into the lungs 2 (two) times daily. For COPD   No facility-administered encounter medications on file as of 08/25/2017.      SIGNIFICANT DIAGNOSTIC EXAMS  NO NEW EXAMS    LABS REVIEWED: PREVIOUS    09-21-16: hgb a1c 8.6 09-25-16: hgb a1c 8.4 12-26-16: wbc 10.2; hgb 13; hct 39; mcv 87.0 plt 327; glucose 112; bun 14; creat 0.5; k+ 4.1; na++ 141; liver normal hgb a1c 7.3  12-29-16: urine culture: ESBL e-coli  03-20-17: tsh 3.10 03-22-17: vit B 12: 357; folate 9.8 06-10-17: wbc 11.4; hgb 13.1; hct 41.9; mcv 91.5; plt 288; glucose 109; bun 10.0; creat 0.56; k+ 4.1; na++ 144; ca 9.0; liver normal albumin 3.8; hgb a1c 7.2; chol 153; ldl 79; trig 176; hdl 39; urine micro-albumin 3.2  NO NEW LABS     Review of Systems  Constitutional: Negative for malaise/fatigue.  Respiratory: Negative for cough and shortness of breath.   Cardiovascular: Negative for chest pain, palpitations and leg swelling.  Gastrointestinal: Negative for abdominal pain, constipation and heartburn.  Musculoskeletal: Negative for back pain, joint pain and myalgias.  Skin: Negative.   Neurological: Negative for dizziness.  Psychiatric/Behavioral: The patient is not nervous/anxious.       Physical Exam  Constitutional: She appears well-developed and well-nourished. No distress.  Neck: Neck supple. No thyromegaly present.  Cardiovascular: Normal rate, regular rhythm and intact distal pulses.  Murmur heard. 1/6  Pulmonary/Chest: Effort normal and breath sounds normal. No respiratory distress.  Abdominal: Soft. Bowel sounds are normal. She exhibits no distension. There  is no tenderness.  Musculoskeletal: Normal range of motion. She exhibits no edema.  Lymphadenopathy:    She has no cervical adenopathy.  Neurological: She is alert.  Skin: Skin is warm and dry. She is not diaphoretic.  Psychiatric: She has a normal mood and affect.      ASSESSMENT/ PLAN:  TODAY:   1. COPD: stable  will continue;mucinex 1200 mg  twice daily ; Ellipta 1  puff daily  flonase 2 puffs nightly   2. Hyperlipidemia associated with type 2 diabetes mellitus: stable ldl is 79; trig 176 is currently off medications; due to some  weight loss.     3. Hypokalemia:stable is currently off supplement   4. Vascular dementia with paranioa: without change in her status; will continue namzaric 28-10 mg daily and will monitor her weight is 178 pounds.   PREVIOUS     5. Gerd: stable  will continue zantac 150 mg twice daily  6. Chronic low back pain: she is presently stable; will continue  neurontin 100 mg three times daily; cymbalta 60 mg daily  will monitor her status.   7. Major depression: she is stable and is benefiting from cymbalta 60 mg daily will not make changes will continue trazodone 25 mg nightly for sleep.   8. Diabetes: her hgb a1c is 7.2 (previous 8.4). Stable Will continue  tradjenta 5  mg daily levemir 20 units nightly  Is on asa ; could not tolerate low does ace   9.  Hypertension: b/p 102/59 stable   will continue asa 81 mg daily    Could not tolerate ACE at low dose will monitor     MD is aware of resident's narcotic use and is in agreement with current plan of care. We will attempt to wean resident as apropriate     Ok Edwards NP [facility]  Contact [phone] Monday through Friday 8am- 5pm  After hours call [phone]

## 2017-08-26 DIAGNOSIS — Z79899 Other long term (current) drug therapy: Secondary | ICD-10-CM | POA: Diagnosis not present

## 2017-08-26 DIAGNOSIS — E785 Hyperlipidemia, unspecified: Secondary | ICD-10-CM | POA: Diagnosis not present

## 2017-08-26 LAB — BASIC METABOLIC PANEL
BUN: 17 (ref 4–21)
Creatinine: 0.5 (ref 0.5–1.1)
GLUCOSE: 133
Potassium: 4.2 (ref 3.4–5.3)
SODIUM: 141 (ref 137–147)

## 2017-08-26 LAB — LIPID PANEL
Cholesterol: 185 (ref 0–200)
HDL: 42 (ref 35–70)
LDL CALC: 112
TRIGLYCERIDES: 156 (ref 40–160)

## 2017-08-26 LAB — HEPATIC FUNCTION PANEL
ALT: 8 (ref 7–35)
AST: 10 — AB (ref 13–35)
Alkaline Phosphatase: 94 (ref 25–125)
Bilirubin, Total: 0.2

## 2017-09-02 ENCOUNTER — Encounter: Payer: Self-pay | Admitting: Adult Health

## 2017-09-02 ENCOUNTER — Non-Acute Institutional Stay (SKILLED_NURSING_FACILITY): Payer: Medicare Other | Admitting: Adult Health

## 2017-09-02 DIAGNOSIS — Z20828 Contact with and (suspected) exposure to other viral communicable diseases: Secondary | ICD-10-CM

## 2017-09-02 NOTE — Progress Notes (Signed)
Location:   [place] Room Number: 333 L KTGYB of Service:  SNF (31)   CODE STATUS: Full Code  Allergies  Allergen Reactions  . Avelox [Moxifloxacin Hcl In Nacl] Hives  . Codeine     Reports her throat swelled in the past but has tolerated since.  . Compazine [Prochlorperazine Edisylate] Nausea And Vomiting  . Morphine And Related     Patient doesn't recall  . Mycobacterium   . Penicillins     Patient doesn't recall.  . Phenothiazines     Patient doesn't recall  . Prednisone     Insomnia, confusion.  Marland Kitchen Reglan [Metoclopramide]     Patient doesn't recall "but it was awful"  . Sulfonamide Derivatives Hives    Chief Complaint  Patient presents with  . Acute Visit    Flu like symptoms    HPI:  [name] [name] has been exposed to influenza A. There is an outbreak on this unit. [name] [name] denies any fevers; no body aches; no cough; no sore throat. There are no nursing concerns at this time.   Past Medical History:  Diagnosis Date  . Abnormality of gait   . Anxiety state, unspecified   . Candidiasis of other urogenital sites   . Chronic airway obstruction, not elsewhere classified   . Chronic obstructive lung disease ([place])   . Depressive disorder, not elsewhere classified   . Esophageal reflux   . Hyperlipemia   . Lumbago   . Osteoporosis   . Spinal stenosis   . 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 Unspecified dementia without behavioral disturbance     Past Surgical History:  Procedure Laterality Date  . ABDOMINAL HYSTERECTOMY    . APPENDECTOMY    . CHOLECYSTECTOMY    . VERTEBROPLASTY      Social History   Socioeconomic History  . Marital status: Divorced    Spouse name: Not on file  . Number of children: Not on file  . Years of education: Not on file  . Highest education level: Not on file  Social Needs  . Financial resource strain: Not on file  . Food insecurity - worry: Not on file  . Food insecurity - inability: Not on file   . Transportation needs - medical: Not on file  . Transportation needs - non-medical: Not on file  Occupational History  . Not on file  Tobacco Use  . Smoking status: Former Smoker    Packs/day: 1.00    Years: 20.00    Pack years: 20.00  . Smokeless tobacco: Never Used  Substance and Sexual Activity  . Alcohol use: No  . Drug use: No  . Sexual activity: No  Other Topics Concern  . Not on file  Social History Narrative  . Not on file   Family History  Family history unknown: Yes      VITAL SIGNS BP 103/62   Pulse 95   Temp (!) 97.1 F (36.2 C)   Resp 18   Ht 5\' 2"  (1.575 m)   Wt 176 lb 6.4 oz (80 kg)   SpO2 93%   BMI 32.26 kg/m    Outpatient Encounter Medications as of 09/02/2017  Medication Sig  . acetaminophen (TYLENOL) 325 MG tablet Take 325 mg by mouth every 4 (four) hours as needed (pain).   Marland Kitchen aspirin EC 81 MG tablet Take 1 tablet (81 mg total) by mouth daily.  . Cholecalciferol 1000 units tablet Take 2,000 Units by mouth at  bedtime.   . DULoxetine (CYMBALTA) 60 MG capsule Take 60 mg by mouth daily. For depression  . fluticasone (FLONASE) 50 MCG/ACT nasal spray Place 2 sprays into both nostrils at bedtime.  . gabapentin (NEURONTIN) 100 MG capsule Take 1 capsule (100 mg total) by mouth 3 (three) times daily.  . Guaifenesin (MUCINEX MAXIMUM STRENGTH) 1200 MG TB12 Take 1 tablet by mouth 2 (two) times daily.   . Hypromellose 0.4 % SOLN Instill 2 drops in both eyes two times a day for dry eyes.  Instill one hour prior to Tobramycin drops  . Insulin Detemir (LEVEMIR FLEXTOUCH) 100 UNIT/ML Pen Inject 20 Units into the skin at bedtime.  Marland Kitchen linagliptin (TRADJENTA) 5 MG TABS tablet Take 5 mg by mouth daily.  . Memantine HCl-Donepezil HCl (NAMZARIC) 28-10 MG CP24 Take 1 capsule by mouth every evening. For dementia  . ranitidine (ZANTAC) 150 MG tablet Take 150 mg by mouth 2 (two) times daily. In the morning and at bedtime For reflux  . traZODone (DESYREL) 25 mg TABS tablet  Take 25 mg by mouth at bedtime.   Marland Kitchen umeclidinium bromide (INCRUSE ELLIPTA) 62.5 MCG/INH AEPB Inhale 1 puff into the lungs daily.    No facility-administered encounter medications on file as of 09/02/2017.      SIGNIFICANT DIAGNOSTIC EXAMS   NO NEW EXAMS    LABS REVIEWED: PREVIOUS    09-21-16: hgb a1c 8.6 09-25-16: hgb a1c 8.4 12-26-16: wbc 10.2; hgb 13; hct 39; mcv 87.0 plt 327; glucose 112; bun 14; creat 0.5; k+ 4.1; na++ 141; liver normal hgb a1c 7.3  12-29-16: urine culture: ESBL e-coli  03-20-17: tsh 3.10 03-22-17: vit B 12: 357; folate 9.8 06-10-17: wbc 11.4; hgb 13.1; hct 41.9; mcv 91.5; plt 288; glucose 109; bun 10.0; creat 0.56; k+ 4.1; na++ 144; ca 9.0; liver normal albumin 3.8; hgb a1c 7.2; chol 153; ldl 79; trig 176; hdl 39; urine micro-albumin 3.2  NO NEW LABS     Review of Systems  Constitutional: Negative for malaise/fatigue.  Respiratory: Negative for cough and shortness of breath.   Cardiovascular: Negative for chest pain, palpitations and leg swelling.  Gastrointestinal: Negative for abdominal pain, constipation and heartburn.  Musculoskeletal: Negative for back pain, joint pain and myalgias.  Skin: Negative.   Neurological: Negative for dizziness.  Psychiatric/Behavioral: The patient is not nervous/anxious.      Physical Exam  Constitutional: [name] [name] appears well-developed and well-nourished. No distress.  Neck: No thyromegaly present.  Cardiovascular: Normal rate, regular rhythm and intact distal pulses.  Murmur heard. 1/6  Pulmonary/Chest: Effort normal and breath sounds normal. No respiratory distress.  Abdominal: Soft. Bowel sounds are normal. [name] [name] exhibits no distension. There is no tenderness.  Musculoskeletal: Normal range of motion. [name] [name] exhibits no edema.  Lymphadenopathy:    [name] [name] has no cervical adenopathy.  Neurological: [name] [name] is alert.  Skin: Skin is warm and dry. [name] [name] is not diaphoretic.  Psychiatric: [name] [name] has a normal mood and affect.      ASSESSMENT/  PLAN:  TODAY   1. Exposure to influenza: will begin tamiflu 75 mg daily for 14 days and will monitor   MD is aware of resident's narcotic use and is in agreement with current plan of care. We will attempt to wean resident as apropriate   Ok Edwards NP [facility]  Contact [phone] Monday through Friday 8am- 5pm  After hours call [phone]

## 2017-09-14 DIAGNOSIS — F331 Major depressive disorder, recurrent, moderate: Secondary | ICD-10-CM | POA: Diagnosis not present

## 2017-09-14 DIAGNOSIS — F0390 Unspecified dementia without behavioral disturbance: Secondary | ICD-10-CM | POA: Diagnosis not present

## 2017-09-24 ENCOUNTER — Non-Acute Institutional Stay (SKILLED_NURSING_FACILITY): Payer: Medicare Other | Admitting: Adult Health

## 2017-09-24 ENCOUNTER — Encounter: Payer: Self-pay | Admitting: Adult Health

## 2017-09-24 DIAGNOSIS — I10 Essential (primary) hypertension: Secondary | ICD-10-CM

## 2017-09-24 DIAGNOSIS — E1169 Type 2 diabetes mellitus with other specified complication: Secondary | ICD-10-CM

## 2017-09-24 DIAGNOSIS — E785 Hyperlipidemia, unspecified: Secondary | ICD-10-CM | POA: Diagnosis not present

## 2017-09-24 DIAGNOSIS — E118 Type 2 diabetes mellitus with unspecified complications: Secondary | ICD-10-CM

## 2017-09-24 DIAGNOSIS — Z794 Long term (current) use of insulin: Secondary | ICD-10-CM

## 2017-09-24 DIAGNOSIS — I152 Hypertension secondary to endocrine disorders: Secondary | ICD-10-CM

## 2017-09-24 DIAGNOSIS — E1159 Type 2 diabetes mellitus with other circulatory complications: Secondary | ICD-10-CM

## 2017-09-24 NOTE — Progress Notes (Signed)
Location:  [facility] Room Number: [place] of Service:  SNF (31)   CODE STATUS: Full code  Allergies  Allergen Reactions  . Avelox [Moxifloxacin Hcl In Nacl] Hives  . Codeine     Reports her throat swelled in the past but has tolerated since.  . Compazine [Prochlorperazine Edisylate] Nausea And Vomiting  . Morphine And Related     Patient doesn't recall  . Mycobacterium   . Penicillins     Patient doesn't recall.  . Phenothiazines     Patient doesn't recall  . Prednisone     Insomnia, confusion.  Marland Kitchen Reglan [Metoclopramide]     Patient doesn't recall "but it was awful"  . Sulfonamide Derivatives Hives    Chief Complaint  Patient presents with  . Acute Visit    Resident is being seen for diabetes management    HPI:  Her cbgs are more elevated to the upper 200 range. There are no reports of missed medications. She denies excessive thirst hunger or urination. There are no nursing concerns at this time.   Past Medical History:  Diagnosis Date  . Abnormality of gait   . Anxiety state, unspecified   . Candidiasis of other urogenital sites   . Chronic airway obstruction, not elsewhere classified   . Chronic obstructive lung disease ([place])   . Depressive disorder, not elsewhere classified   . Esophageal reflux   . Hyperlipemia   . Lumbago   . Osteoporosis   . Spinal stenosis   . 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 Unspecified dementia without behavioral disturbance     Past Surgical History:  Procedure Laterality Date  . ABDOMINAL HYSTERECTOMY    . APPENDECTOMY    . CHOLECYSTECTOMY    . VERTEBROPLASTY      Social History   Socioeconomic History  . Marital status: Divorced    Spouse name: Not on file  . Number of children: Not on file  . Years of education: Not on file  . Highest education level: Not on file  Social Needs  . Financial resource strain: Not on file  . Food insecurity - worry: Not on file  .  Food insecurity - inability: Not on file  . Transportation needs - medical: Not on file  . Transportation needs - non-medical: Not on file  Occupational History  . Not on file  Tobacco Use  . Smoking status: Former Smoker    Packs/day: 1.00    Years: 20.00    Pack years: 20.00  . Smokeless tobacco: Never Used  Substance and Sexual Activity  . Alcohol use: No  . Drug use: No  . Sexual activity: No  Other Topics Concern  . Not on file  Social History Narrative  . Not on file   Family History  Family history unknown: Yes      VITAL SIGNS BP 122/84   Pulse 67   Temp 98.5 F (36.9 C)   Resp 19   Ht 5\' 2"  (1.575 m)   Wt 176 lb (79.8 kg)   SpO2 97%   BMI 32.19 kg/m    Outpatient Encounter Medications as of 09/24/2017  Medication Sig  . acetaminophen (TYLENOL) 325 MG tablet Take 325 mg by mouth every 4 (four) hours as needed (pain).   Marland Kitchen aspirin EC 81 MG tablet Take 1 tablet (81 mg total) by mouth daily.  . Cholecalciferol 1000 units tablet Take 2,000 Units by mouth at bedtime.   Marland Kitchen  DULoxetine (CYMBALTA) 60 MG capsule Take 60 mg by mouth daily. For depression  . fluticasone (FLONASE) 50 MCG/ACT nasal spray Place 2 sprays into both nostrils at bedtime.  . gabapentin (NEURONTIN) 100 MG capsule Take 1 capsule (100 mg total) by mouth 3 (three) times daily.  . Guaifenesin (MUCINEX MAXIMUM STRENGTH) 1200 MG TB12 Take 1 tablet by mouth 2 (two) times daily.   . Hypromellose 0.4 % SOLN Instill 2 drops in both eyes two times a day for dry eyes.  Instill one hour prior to Tobramycin drops  . Insulin Detemir (LEVEMIR FLEXTOUCH) 100 UNIT/ML Pen Inject 20 Units into the skin at bedtime.  Marland Kitchen linagliptin (TRADJENTA) 5 MG TABS tablet Take 5 mg by mouth daily.  . Memantine HCl-Donepezil HCl (NAMZARIC) 28-10 MG CP24 Take 1 capsule by mouth every evening. For dementia  . ranitidine (ZANTAC) 150 MG tablet Take 150 mg by mouth 2 (two) times daily. In the morning and at bedtime For reflux  .  traZODone (DESYREL) 25 mg TABS tablet Take 25 mg by mouth at bedtime.   Marland Kitchen umeclidinium bromide (INCRUSE ELLIPTA) 62.5 MCG/INH AEPB Inhale 1 puff into the lungs daily.    No facility-administered encounter medications on file as of 09/24/2017.      SIGNIFICANT DIAGNOSTIC EXAMS   NO NEW EXAMS    LABS REVIEWED: PREVIOUS    09-21-16: hgb a1c 8.6 09-25-16: hgb a1c 8.4 12-26-16: wbc 10.2; hgb 13; hct 39; mcv 87.0 plt 327; glucose 112; bun 14; creat 0.5; k+ 4.1; na++ 141; liver normal hgb a1c 7.3  12-29-16: urine culture: ESBL e-coli  03-20-17: tsh 3.10 03-22-17: vit B 12: 357; folate 9.8 06-10-17: wbc 11.4; hgb 13.1; hct 41.9; mcv 91.5; plt 288; glucose 109; bun 10.0; creat 0.56; k+ 4.1; na++ 144; ca 9.0; liver normal albumin 3.8; hgb a1c 7.2; chol 153; ldl 79; trig 176; hdl 39; urine micro-albumin 3.2  NO NEW LABS     Review of Systems  Constitutional: Negative for malaise/fatigue.  Respiratory: Negative for cough and shortness of breath.   Cardiovascular: Negative for chest pain, palpitations and leg swelling.  Gastrointestinal: Negative for abdominal pain, constipation and heartburn.  Genitourinary: Negative for frequency.  Musculoskeletal: Negative for back pain, joint pain and myalgias.  Skin: Negative.   Neurological: Negative for dizziness.  Endo/Heme/Allergies: Negative for polydipsia.  Psychiatric/Behavioral: The patient is not nervous/anxious.     Physical Exam  Constitutional: She appears well-developed and well-nourished. No distress.  Neck: No thyromegaly present.  Cardiovascular: Normal rate, regular rhythm and intact distal pulses.  Murmur heard. 1/6  Pulmonary/Chest: Effort normal and breath sounds normal. No respiratory distress.  Abdominal: Soft. Bowel sounds are normal. She exhibits no distension. There is no tenderness.  Musculoskeletal: Normal range of motion. She exhibits no edema.  Lymphadenopathy:    She has no cervical adenopathy.  Neurological: She is  alert.  Skin: Skin is warm and dry. She is not diaphoretic.  Psychiatric: She has a normal mood and affect.     ASSESSMENT/ PLAN:  TODAY:   1. Hypertension associated with diabetes stable b/p 122/84: is currently off medications will monitor   2. Hyperlipidemia associated with type 2 diabetes mellitus: ldl 79 trig 176 will begin lipitor 10 mg daily    3. Type 2 diabetes mellitus with complication wih long term current use of insulin:  hgb a1c 7.2; cbgs are elevated: will increase levemir to 30 units nightly will continue metformin 500 mg daily and tradjenta 5 mg daily will  check cbc twice daily     MD is aware of resident's narcotic use and is in agreement with current plan of care. We will attempt to wean resident as apropriate   Ok Edwards NP [facility]  Contact [phone] Monday through Friday 8am- 5pm  After hours call [phone]

## 2017-09-30 DIAGNOSIS — B351 Tinea unguium: Secondary | ICD-10-CM | POA: Diagnosis not present

## 2017-09-30 DIAGNOSIS — E1051 Type 1 diabetes mellitus with diabetic peripheral angiopathy without gangrene: Secondary | ICD-10-CM | POA: Diagnosis not present

## 2017-09-30 DIAGNOSIS — R262 Difficulty in walking, not elsewhere classified: Secondary | ICD-10-CM | POA: Diagnosis not present

## 2017-10-01 ENCOUNTER — Encounter: Payer: Self-pay | Admitting: Adult Health

## 2017-10-01 ENCOUNTER — Non-Acute Institutional Stay (SKILLED_NURSING_FACILITY): Payer: Medicare Other | Admitting: Adult Health

## 2017-10-01 DIAGNOSIS — F0150 Vascular dementia without behavioral disturbance: Secondary | ICD-10-CM | POA: Diagnosis not present

## 2017-10-01 DIAGNOSIS — F0152 Vascular dementia, unspecified severity, with psychotic disturbance: Secondary | ICD-10-CM

## 2017-10-01 DIAGNOSIS — J449 Chronic obstructive pulmonary disease, unspecified: Secondary | ICD-10-CM

## 2017-10-01 NOTE — Progress Notes (Signed)
Location:    [name] Room Number: 161 Place of Service:  SNF (31)   CODE STATUS: DNR (MOST updated 10-01-17)  Allergies  Allergen Reactions  . Avelox [Moxifloxacin Hcl In Nacl] Hives  . Codeine     Reports her throat swelled in the past but has tolerated since.  . Compazine [Prochlorperazine Edisylate] Nausea And Vomiting  . Morphine And Related     Patient doesn't recall  . Mycobacterium   . Penicillins     Patient doesn't recall.  . Phenothiazines     Patient doesn't recall  . Prednisone     Insomnia, confusion.  Marland Kitchen Reglan [Metoclopramide]     Patient doesn't recall "but it was awful"  . Sulfonamide Derivatives Hives    Chief Complaint  Patient presents with  . Acute Visit    car plan meeting     HPI:  We have come together for her are plan meeting. She does not have family present; but is able to make her own desires known. We did discuss her medical status; her medications. We did review her MOST form; she has decided to become a DNR with no tube feeding. She hs not nursing concerns and no medical concerns. There are no nursing concerns at this time. She denies back pain; anxiety; no changes in appetite.   Past Medical History:  Diagnosis Date  . Abnormality of gait   . Anxiety state, unspecified   . Candidiasis of other urogenital sites   . Chronic airway obstruction, not elsewhere classified   . Chronic obstructive lung disease ([place])   . Depressive disorder, not elsewhere classified   . Esophageal reflux   . Hyperlipemia   . Lumbago   . Osteoporosis   . Spinal stenosis   . 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 Unspecified dementia without behavioral disturbance     Past Surgical History:  Procedure Laterality Date  . ABDOMINAL HYSTERECTOMY    . APPENDECTOMY    . CHOLECYSTECTOMY    . VERTEBROPLASTY      Social History   Socioeconomic History  . Marital status: Divorced    Spouse name: Not on file    . Number of children: Not on file  . Years of education: Not on file  . Highest education level: Not on file  Social Needs  . Financial resource strain: Not on file  . Food insecurity - worry: Not on file  . Food insecurity - inability: Not on file  . Transportation needs - medical: Not on file  . Transportation needs - non-medical: Not on file  Occupational History  . Not on file  Tobacco Use  . Smoking status: Former Smoker    Packs/day: 1.00    Years: 20.00    Pack years: 20.00  . Smokeless tobacco: Never Used  Substance and Sexual Activity  . Alcohol use: No  . Drug use: No  . Sexual activity: No  Other Topics Concern  . Not on file  Social History Narrative  . Not on file   Family History  Family history unknown: Yes      VITAL SIGNS BP 128/80   Pulse 76   Temp 98.1 F (36.7 C)   Resp 18   Ht 5\' 2"  (1.575 m)   Wt 177 lb 3.2 oz (80.4 kg)   SpO2 97%   BMI 32.41 kg/m   Outpatient Encounter Medications as of 10/01/2017  Medication Sig  .  acetaminophen (TYLENOL) 325 MG tablet Take 325 mg by mouth every 4 (four) hours as needed (pain).   Marland Kitchen aspirin EC 81 MG tablet Take 1 tablet (81 mg total) by mouth daily.  Marland Kitchen atorvastatin (LIPITOR) 10 MG tablet Take 10 mg by mouth daily.  . Cholecalciferol 1000 units tablet Take 2,000 Units by mouth at bedtime.   . DULoxetine (CYMBALTA) 60 MG capsule Take 60 mg by mouth daily. For depression  . fluticasone (FLONASE) 50 MCG/ACT nasal spray Place 2 sprays into both nostrils at bedtime.  . gabapentin (NEURONTIN) 100 MG capsule Take 1 capsule (100 mg total) by mouth 3 (three) times daily.  . Guaifenesin (MUCINEX MAXIMUM STRENGTH) 1200 MG TB12 Take 1 tablet by mouth 2 (two) times daily.   . Hypromellose 0.4 % SOLN Instill 2 drops in both eyes two times a day for dry eyes.  Instill one hour prior to Tobramycin drops  . Insulin Detemir (LEVEMIR FLEXTOUCH) 100 UNIT/ML Pen Inject 30 Units into the skin at bedtime.   Marland Kitchen linagliptin  (TRADJENTA) 5 MG TABS tablet Take 5 mg by mouth daily.  . Memantine HCl-Donepezil HCl (NAMZARIC) 28-10 MG CP24 Take 1 capsule by mouth every evening. For dementia  . metFORMIN (GLUCOPHAGE) 500 MG tablet Take 500 mg by mouth daily with breakfast.  . ranitidine (ZANTAC) 150 MG tablet Take 150 mg by mouth 2 (two) times daily. In the morning and at bedtime For reflux  . traZODone (DESYREL) 25 mg TABS tablet Take 25 mg by mouth at bedtime.   Marland Kitchen umeclidinium bromide (INCRUSE ELLIPTA) 62.5 MCG/INH AEPB Inhale 1 puff into the lungs daily.    No facility-administered encounter medications on file as of 10/01/2017.      SIGNIFICANT DIAGNOSTIC EXAMS  NO NEW EXAMS    LABS REVIEWED: PREVIOUS    09-21-16: hgb a1c 8.6 09-25-16: hgb a1c 8.4 12-26-16: wbc 10.2; hgb 13; hct 39; mcv 87.0 plt 327; glucose 112; bun 14; creat 0.5; k+ 4.1; na++ 141; liver normal hgb a1c 7.3  12-29-16: urine culture: ESBL e-coli  03-20-17: tsh 3.10 03-22-17: vit B 12: 357; folate 9.8 06-10-17: wbc 11.4; hgb 13.1; hct 41.9; mcv 91.5; plt 288; glucose 109; bun 10.0; creat 0.56; k+ 4.1; na++ 144; ca 9.0; liver normal albumin 3.8; hgb a1c 7.2; chol 153; ldl 79; trig 176; hdl 39; urine micro-albumin 3.2  NO NEW LABS      Review of Systems  Constitutional: Negative for malaise/fatigue.  Respiratory: Negative for cough and shortness of breath.   Cardiovascular: Negative for chest pain, palpitations and leg swelling.  Gastrointestinal: Negative for abdominal pain, constipation and heartburn.  Musculoskeletal: Negative for back pain, joint pain and myalgias.  Skin: Negative.   Neurological: Negative for dizziness.  Psychiatric/Behavioral: The patient is not nervous/anxious.       Physical Exam  Constitutional: She is oriented to person, place, and time. She appears well-developed and well-nourished. No distress.  Neck: No thyromegaly present.  Cardiovascular: Normal rate, regular rhythm and intact distal pulses.  Murmur  heard. 1/6  Pulmonary/Chest: Effort normal. No respiratory distress.  Abdominal: Soft. Bowel sounds are normal. She exhibits no distension. There is no tenderness.  Musculoskeletal: Normal range of motion. She exhibits no edema.  Lymphadenopathy:    She has no cervical adenopathy.  Neurological: She is alert and oriented to person, place, and time.  Skin: Skin is warm and dry. She is not diaphoretic.  Psychiatric: She has a normal mood and affect.     ASSESSMENT/  PLAN:  TODAY:   1. Chronic obstructive pulmonary disease 2. Vascular dementia with paranoia 3. Chronic low back pain without sciatica  Her MOST: DNR: no tube feeding; ok for hospitalization; abt and ivf  Will continue her current plan of care Will continue her current medications and will monitor her status.   Time spent with patient 45 minutes: discussed medical status; medications her advacce care planning and did make appropriate changes to her MOST form. She did verbalize understanding.    MD is aware of resident's narcotic use and is in agreement with current plan of care. We will attempt to wean resident as apropriate   Ok Edwards NP [facility]  Contact [phone] Monday through Friday 8am- 5pm  After hours call [phone]

## 2017-10-04 ENCOUNTER — Non-Acute Institutional Stay (SKILLED_NURSING_FACILITY): Payer: Medicare Other | Admitting: Adult Health

## 2017-10-04 ENCOUNTER — Encounter: Payer: Self-pay | Admitting: Adult Health

## 2017-10-04 DIAGNOSIS — M545 Low back pain: Secondary | ICD-10-CM

## 2017-10-04 DIAGNOSIS — G8929 Other chronic pain: Secondary | ICD-10-CM | POA: Diagnosis not present

## 2017-10-04 DIAGNOSIS — F339 Major depressive disorder, recurrent, unspecified: Secondary | ICD-10-CM | POA: Diagnosis not present

## 2017-10-04 DIAGNOSIS — K219 Gastro-esophageal reflux disease without esophagitis: Secondary | ICD-10-CM

## 2017-10-04 NOTE — Progress Notes (Signed)
Location:    [place] Room Number: 829 Place of Service:  SNF (31)   CODE STATUS: DNR (MOST up dated 10-01-17)  Allergies  Allergen Reactions  . Avelox [Moxifloxacin Hcl In Nacl] Hives  . Codeine     Reports her throat swelled in the past but has tolerated since.  . Compazine [Prochlorperazine Edisylate] Nausea And Vomiting  . Morphine And Related     Patient doesn't recall  . Mycobacterium   . Penicillins     Patient doesn't recall.  . Phenothiazines     Patient doesn't recall  . Prednisone     Insomnia, confusion.  Marland Kitchen Reglan [Metoclopramide]     Patient doesn't recall "but it was awful"  . Sulfonamide Derivatives Hives    Chief Complaint  Patient presents with  . Medical Management of Chronic Issues    Gerd; back pain; depression     HPI:  She is a long term resident of this facility being seen for the management of her chronic illnesses: gerd; back pain and depression. She denies any back pain; no insomnia; no changes in appetite; no heart burn. There are no nursing concerns at this time.   Past Medical History:  Diagnosis Date  . Abnormality of gait   . Anxiety state, unspecified   . Candidiasis of other urogenital sites   . Chronic airway obstruction, not elsewhere classified   . Chronic obstructive lung disease ([place])   . Depressive disorder, not elsewhere classified   . Esophageal reflux   . Hyperlipemia   . Lumbago   . Osteoporosis   . Spinal stenosis   . 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 Unspecified dementia without behavioral disturbance     Past Surgical History:  Procedure Laterality Date  . ABDOMINAL HYSTERECTOMY    . APPENDECTOMY    . CHOLECYSTECTOMY    . VERTEBROPLASTY      Social History   Socioeconomic History  . Marital status: Divorced    Spouse name: Not on file  . Number of children: Not on file  . Years of education: Not on file  . Highest education level: Not on file    Social Needs  . Financial resource strain: Not on file  . Food insecurity - worry: Not on file  . Food insecurity - inability: Not on file  . Transportation needs - medical: Not on file  . Transportation needs - non-medical: Not on file  Occupational History  . Not on file  Tobacco Use  . Smoking status: Former Smoker    Packs/day: 1.00    Years: 20.00    Pack years: 20.00  . Smokeless tobacco: Never Used  Substance and Sexual Activity  . Alcohol use: No  . Drug use: No  . Sexual activity: No  Other Topics Concern  . Not on file  Social History Narrative  . Not on file   Family History  Family history unknown: Yes      VITAL SIGNS BP 99/60   Pulse 70   Temp (!) 96.5 F (35.8 C)   Resp 18   Ht 5\' 2"  (1.575 m)   Wt 177 lb 3.2 oz (80.4 kg)   SpO2 97%   BMI 32.41 kg/m   Outpatient Encounter Medications as of 10/04/2017  Medication Sig  . acetaminophen (TYLENOL) 325 MG tablet Take 325 mg by mouth every 4 (four) hours as needed (pain).   Marland Kitchen aspirin EC 81 MG  tablet Take 1 tablet (81 mg total) by mouth daily.  Marland Kitchen atorvastatin (LIPITOR) 10 MG tablet Take 10 mg by mouth daily.  . Cholecalciferol 1000 units tablet Take 2,000 Units by mouth at bedtime.   . DULoxetine (CYMBALTA) 60 MG capsule Take 60 mg by mouth daily. For depression  . fluticasone (FLONASE) 50 MCG/ACT nasal spray Place 2 sprays into both nostrils at bedtime.  . gabapentin (NEURONTIN) 100 MG capsule Take 1 capsule (100 mg total) by mouth 3 (three) times daily.  . Guaifenesin (MUCINEX MAXIMUM STRENGTH) 1200 MG TB12 Take 1 tablet by mouth 2 (two) times daily.   . Hypromellose 0.4 % SOLN Instill 2 drops in both eyes two times a day for dry eyes.  Instill one hour prior to Tobramycin drops  . Insulin Detemir (LEVEMIR FLEXTOUCH) 100 UNIT/ML Pen Inject 30 Units into the skin at bedtime.   Marland Kitchen linagliptin (TRADJENTA) 5 MG TABS tablet Take 5 mg by mouth daily.  . Memantine HCl-Donepezil HCl (NAMZARIC) 28-10 MG CP24 Take  1 capsule by mouth every evening. For dementia  . metFORMIN (GLUCOPHAGE) 500 MG tablet Take 500 mg by mouth daily with breakfast.  . ranitidine (ZANTAC) 150 MG tablet Take 150 mg by mouth 2 (two) times daily. In the morning and at bedtime For reflux  . traZODone (DESYREL) 25 mg TABS tablet Take 25 mg by mouth at bedtime.   Marland Kitchen umeclidinium bromide (INCRUSE ELLIPTA) 62.5 MCG/INH AEPB Inhale 1 puff into the lungs daily.    No facility-administered encounter medications on file as of 10/04/2017.      SIGNIFICANT DIAGNOSTIC EXAMS   NO NEW EXAMS    LABS REVIEWED: PREVIOUS    09-21-16: hgb a1c 8.6 09-25-16: hgb a1c 8.4 12-26-16: wbc 10.2; hgb 13; hct 39; mcv 87.0 plt 327; glucose 112; bun 14; creat 0.5; k+ 4.1; na++ 141; liver normal hgb a1c 7.3  12-29-16: urine culture: ESBL e-coli  03-20-17: tsh 3.10 03-22-17: vit B 12: 357; folate 9.8 06-10-17: wbc 11.4; hgb 13.1; hct 41.9; mcv 91.5; plt 288; glucose 109; bun 10.0; creat 0.56; k+ 4.1; na++ 144; ca 9.0; liver normal albumin 3.8; hgb a1c 7.2; chol 153; ldl 79; trig 176; hdl 39; urine micro-albumin 3.2  NO NEW LABS      Review of Systems  Constitutional: Negative for malaise/fatigue.  Respiratory: Negative for cough and shortness of breath.   Cardiovascular: Negative for chest pain, palpitations and leg swelling.  Gastrointestinal: Negative for abdominal pain, constipation and heartburn.  Musculoskeletal: Negative for back pain, joint pain and myalgias.  Skin: Negative.   Neurological: Negative for dizziness.  Psychiatric/Behavioral: The patient is not nervous/anxious.     Physical Exam  Constitutional: She is oriented to person, place, and time. She appears well-developed and well-nourished. No distress.  Neck: No thyromegaly present.  Cardiovascular: Normal rate, regular rhythm and intact distal pulses.  Murmur heard. 1/6  Pulmonary/Chest: Effort normal and breath sounds normal. No respiratory distress.  Abdominal: Soft. Bowel sounds  are normal. She exhibits no distension. There is no tenderness.  Musculoskeletal: Normal range of motion. She exhibits no edema.  Lymphadenopathy:    She has no cervical adenopathy.  Neurological: She is alert and oriented to person, place, and time.  Skin: Skin is warm and dry. She is not diaphoretic.  Psychiatric: She has a normal mood and affect.     ASSESSMENT/ PLAN:  TODAY:   1. Gerd without esophagitis : stable  will continue zantac 150 mg twice daily  2.  Chronic low back pain without sciatica: she is presently stable; will continue  neurontin 100 mg three times daily; cymbalta 60 mg daily  will monitor her status.   3. Major depressive disorder, recurrent episode: she is stable and is benefiting from cymbalta 60 mg daily (also takes for pain management) will not make changes will continue trazodone 25 mg nightly for sleep.   PREVIOUS     4. Diabetes: her hgb a1c is 7.2 (previous 8.4). Stable Will continue  tradjenta 5  mg daily levemir 00 units nightly and will continue metformin 500 mg daily Is on asa ; could not tolerate low does ace   5.  Hypertension: b/p 99/60 stable   will continue asa 81 mg daily    Could not tolerate ACE at low dose will monitor  6. COPD: stable  will continue;mucinex 1200 mg  twice daily ; Incruse ellipta 1  puff daily  flonase 2 puffs nightlyfor allergic rhinitis   7. Hyperlipidemia associated with type 2 diabetes mellitus: stable ldl is 79; trig 176 will continue lipitor 10 mg daily will monitor her status.    8. Vascular dementia with paranioa: without change in her status; will continue namzaric 28-10 mg daily and will monitor her weight is 177 pounds.      MD is aware of resident's narcotic use and is in agreement with current plan of care. We will attempt to wean resident as apropriate   Ok Edwards NP [facility]  Contact [phone] Monday through Friday 8am- 5pm  After hours call [phone]

## 2017-10-05 DIAGNOSIS — F0390 Unspecified dementia without behavioral disturbance: Secondary | ICD-10-CM | POA: Diagnosis not present

## 2017-10-05 DIAGNOSIS — F331 Major depressive disorder, recurrent, moderate: Secondary | ICD-10-CM | POA: Diagnosis not present

## 2017-10-12 ENCOUNTER — Non-Acute Institutional Stay (INDEPENDENT_AMBULATORY_CARE_PROVIDER_SITE_OTHER): Payer: Federal, State, Local not specified - PPO | Admitting: Internal Medicine

## 2017-10-12 DIAGNOSIS — F0150 Vascular dementia without behavioral disturbance: Secondary | ICD-10-CM

## 2017-10-12 DIAGNOSIS — F0152 Vascular dementia, unspecified severity, with psychotic disturbance: Secondary | ICD-10-CM

## 2017-10-12 NOTE — Progress Notes (Signed)
Changed nursing home status from [facility] to [facility].

## 2017-11-01 ENCOUNTER — Non-Acute Institutional Stay (SKILLED_NURSING_FACILITY): Payer: Medicare Other | Admitting: Internal Medicine

## 2017-11-01 ENCOUNTER — Encounter: Payer: Self-pay | Admitting: Internal Medicine

## 2017-11-01 DIAGNOSIS — J449 Chronic obstructive pulmonary disease, unspecified: Secondary | ICD-10-CM | POA: Diagnosis not present

## 2017-11-01 DIAGNOSIS — I10 Essential (primary) hypertension: Secondary | ICD-10-CM

## 2017-11-01 DIAGNOSIS — E1159 Type 2 diabetes mellitus with other circulatory complications: Secondary | ICD-10-CM

## 2017-11-01 DIAGNOSIS — F339 Major depressive disorder, recurrent, unspecified: Secondary | ICD-10-CM

## 2017-11-01 DIAGNOSIS — F0150 Vascular dementia without behavioral disturbance: Secondary | ICD-10-CM

## 2017-11-01 DIAGNOSIS — E1169 Type 2 diabetes mellitus with other specified complication: Secondary | ICD-10-CM | POA: Diagnosis not present

## 2017-11-01 DIAGNOSIS — E118 Type 2 diabetes mellitus with unspecified complications: Secondary | ICD-10-CM

## 2017-11-01 DIAGNOSIS — Z794 Long term (current) use of insulin: Secondary | ICD-10-CM

## 2017-11-01 DIAGNOSIS — E785 Hyperlipidemia, unspecified: Secondary | ICD-10-CM

## 2017-11-01 DIAGNOSIS — F0152 Vascular dementia, unspecified severity, with psychotic disturbance: Secondary | ICD-10-CM

## 2017-11-01 DIAGNOSIS — I152 Hypertension secondary to endocrine disorders: Secondary | ICD-10-CM

## 2017-11-01 NOTE — Progress Notes (Signed)
**Note [name]-Identified via Obfuscation** Patient ID: [name] [name], female   DOB: 05-Oct-1940, 77 y.o.   MRN: [id number]  Location:  [name]: [name] [name]:  SNF [phone]) Provider:  [name], [place], DO  Patient Care Team: [name] Cranker, DO as PCP - General (Internal Medicine) [name] Cowden Phylis Bougie, NP as Nurse Practitioner ([name] [name]) [name] [name], [name] [name] ([name] [name])  Extended Emergency Contact Information Primary Emergency Contact: [name] [name]) Address: [address]          [name] Phone: [phone] Relation: Son Secondary Emergency Contact: [name] [name] [place] Phone: [phone] Work Phone: [phone] Relation: Son  Code Status:  DNR Goals of care: Advanced Directive information Advanced Directives 11/01/2017  Does Patient Have a Medical Advance Directive? Yes  Type of Advance Directive Out of facility DNR (pink MOST or yellow form)  Does patient want to make changes to medical advance directive? No - Patient declined  Copy of [place] in Chart? -  Would patient like information on creating a medical advance directive? No - Patient declined  Pre-existing out of facility DNR order (yellow form or pink MOST form) Pink MOST form placed in chart (order not valid for inpatient use);Yellow form placed in chart (order not valid for inpatient use)     Chief Complaint  Patient presents with  . Medical Management of Chronic Issues    1 month follow up    HPI:  Pt is a 77 y.o. female seen today for medical management of chronic diseases.  Appetite is poor and she does not consume most of her meal. Sleeps well. No falls. She is a poor historian due to dementia. Hx obtained from chart.  GERD - stable on [name] 150 mg twice daily  Chronic low back pain without sciatica - stable on neurontin 100 mg three times daily; [name] 60 mg daily    MDD - recurrent episode - mood stable on [name] 60 mg daily; trazodone 25 mg nightly for sleep. She does benefit from this regimen  DM - controlled. A1c 7.2%; takes [name] 5  mg daily; levemir 30 units nightly; metformin 500 mg daily. Takes ASA and statin daily. She was unable to tolerate low dose ACEI in the past.  HTN - diet controlled. She takes ASA 81 mg daily     COPD - stable on ;[name] 1200 mg  twice daily; [name] 1  puff daily;  flonase 2 puffs nightly for allergic rhinitis   Hyperlipidemia - stable on [name] 10 mg daily; LDL 112; TG 156.    Vascular dementia with paranioa - stable on [name] 28-10 mg daily. Weight stable     Past Medical History:  Diagnosis Date  . Abnormality of gait   . Anxiety state, unspecified   . Candidiasis of other urogenital sites   . Chronic airway obstruction, not elsewhere classified   . Chronic obstructive lung disease ([place])   . Depressive disorder, not elsewhere classified   . Esophageal reflux   . Hyperlipemia   . Lumbago   . Osteoporosis   . Spinal stenosis   . 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 Unspecified dementia without behavioral disturbance    Past Surgical History:  Procedure Laterality Date  . ABDOMINAL HYSTERECTOMY    . APPENDECTOMY    . CHOLECYSTECTOMY    . VERTEBROPLASTY      Allergies  Allergen Reactions  . Avelox [Moxifloxacin Hcl In Nacl] Hives  . Codeine     Reports her throat swelled in the past but has tolerated since.  . Compazine [Prochlorperazine Edisylate] Nausea And Vomiting  . Morphine And Related     Patient doesn't recall  . Mycobacterium   . Penicillins     Patient doesn't recall.  . Phenothiazines     Patient doesn't recall  . Prednisone     Insomnia, confusion.  Marland Kitchen Reglan [Metoclopramide]     Patient doesn't recall "but it was awful"  . Sulfonamide Derivatives Hives    Outpatient Encounter Medications as of 11/01/2017  Medication Sig  .  acetaminophen ([name]) 325 MG tablet Take 325 mg by mouth every 4 (four) hours as needed (pain).   Marland Kitchen aspirin EC 81 MG tablet Take 1 tablet (81 mg total) by mouth daily.  Marland Kitchen atorvastatin ([name]) 10 MG tablet Take 10 mg by mouth daily.  . Cholecalciferol 1000 units tablet Take 2,000 Units by mouth at bedtime.   . [name] ([name] [name]) 60 MG capsule Take 60 mg by mouth daily. For depression  . fluticasone (FLONASE) 50 MCG/ACT nasal spray Place 2 sprays into both nostrils at bedtime.  . gabapentin (NEURONTIN) 100 MG capsule Take 1 capsule (100 mg total) by mouth 3 (three) times daily.  . Guaifenesin ([name]) 1200 MG TB12 Take 1 tablet by mouth 2 (two) times daily.   . Hypromellose 0.4 % SOLN Instill 2 drops in both eyes two times a day for dry eyes.  Instill one hour prior to Tobramycin drops  . Insulin Detemir (LEVEMIR FLEXTOUCH) 100 UNIT/ML Pen Inject 30 Units into the skin at bedtime.   Marland Kitchen linagliptin ([name]) 5 MG TABS tablet Take 5 mg by mouth daily.  . Memantine HCl-Donepezil HCl ([name]) 28-10 MG CP24 Take 1 capsule by mouth every evening. For dementia  . metFORMIN ([name]) 500 MG tablet Take 500 mg by mouth daily with breakfast.  . ranitidine ([name]) 150 MG tablet Take 150 mg by mouth daily. In the morning and at bedtime For reflux  . traZODone ([name]) 25 mg TABS tablet Take 25 mg by mouth at bedtime.   Marland Kitchen umeclidinium bromide ([name]) 62.5 MCG/INH AEPB Inhale 1 puff into the lungs daily.    No facility-administered encounter medications on file as of 11/01/2017.     Review of Systems  Unable to perform ROS: Dementia    Immunization History  Administered Date(s) Administered  . Influenza Whole 05/22/2013  . Influenza-Unspecified 05/23/2014, 05/20/2015, 06/27/2016, 06/09/2017  . PPD Test 05/01/2016, 05/08/2016  . Pneumococcal-Unspecified 08/31/2011   Pertinent  Health Maintenance Due  Topic Date Due  . OPHTHALMOLOGY EXAM  11/23/2017  .  HEMOGLOBIN A1C  12/08/2017  . URINE MICROALBUMIN  06/10/2018  . FOOT EXAM  07/05/2018  . INFLUENZA VACCINE  Completed  . DEXA SCAN  Completed  . PNA vac Low Risk Adult  Completed   Fall Risk  03/08/2017 12/06/2014  Falls in the past year? No No   Functional Status Survey:    Vitals:   11/01/17 1245  BP: (!) 129/58  Pulse: 70  Resp: 18  Temp: (!) 96.9 F (36.1 C)  SpO2: 97%  Weight: 175 lb (79.4 kg)  Height: 5\' 2"  (1.575 m)   Body mass index is 32.01 kg/m. Physical Exam  Constitutional: She appears well-developed and well-nourished.  HENT:  Mouth/Throat: Oropharynx is clear and moist. No oropharyngeal exudate.  MMM; no oral thrush  Eyes:  Pupils are equal, round, and reactive to light. No scleral icterus.  Neck: Neck supple. Carotid bruit is not present. No tracheal deviation present. No thyromegaly present.  Cardiovascular: Normal rate, regular rhythm and intact distal pulses. Exam reveals no gallop and no friction rub.  Murmur (1/6 SEM) heard. No LE edema b/l. no calf TTP.   Pulmonary/Chest: Effort normal and breath sounds normal. No stridor. No respiratory distress. She has no wheezes. She has no rales.  Abdominal: Soft. Normal appearance and bowel sounds are normal. She exhibits no distension and no mass. There is no hepatomegaly. There is no tenderness. There is no rigidity, no rebound and no guarding. No hernia.  obese  Musculoskeletal: She exhibits edema.  Lymphadenopathy:    She has no cervical adenopathy.  Neurological: She is alert.  Skin: Skin is warm and dry. No rash noted.  Psychiatric: She has a normal mood and affect. Her behavior is normal.    Labs reviewed: Recent Labs    12/26/16 06/10/17 08/26/17  NA 141 144 141  K 4.1 4.1 4.2  BUN 14 10 17   CREATININE 0.5 0.6 0.5   Recent Labs    12/26/16 06/10/17 08/26/17  AST 9* 8* 10*  ALT 9 7 8   ALKPHOS 85 94 94   Recent Labs    11/18/16 12/26/16 06/10/17  WBC 10.3 10.2 11.4  NEUTROABS 7 7 8   HGB  13.1 13.0 13.1  HCT 41 39 42  PLT 305 327 288   Lab Results  Component Value Date   TSH 3.10 03/20/2017   Lab Results  Component Value Date   HGBA1C 7.2 06/10/2017   Lab Results  Component Value Date   CHOL 185 08/26/2017   HDL 42 08/26/2017   LDLCALC 112 08/26/2017   TRIG 156 08/26/2017   CHOLHDL 5.7 03/11/2011    Significant Diagnostic Results in last 30 days:  No results found.  Assessment/Plan   ICD-10-CM   1. Vascular dementia with paranoia F01.50   2. Type 2 diabetes mellitus with complication, with long-term current use of insulin (HCC) E11.8    Z79.4   3. Chronic obstructive pulmonary disease, unspecified COPD type ([place]) J44.9   4. Hyperlipidemia associated with type 2 diabetes mellitus ([place]) E11.69    E78.5   5. Hypertension associated with diabetes ([place]) E11.59    I10    diet controlled  6. Recurrent major depressive disorder, remission status unspecified (HCC) F33.9     Cont current meds as ordered  PT/OT/ST as indicated  Cont nutritional supplements as indicated  Psych services to follow as indicated  Will follow  Labs/tests ordered: a1c   Nalin Mazzocco S. Perlie Gold  [facility] [address] [place], [place] [address] [phone] Cell (Monday-Friday 8 AM - 5 PM) [phone] After 5 PM and follow prompts

## 2017-11-02 DIAGNOSIS — E119 Type 2 diabetes mellitus without complications: Secondary | ICD-10-CM | POA: Diagnosis not present

## 2017-11-02 LAB — HEMOGLOBIN A1C: Hemoglobin A1C: 6.9

## 2017-11-15 DIAGNOSIS — H40023 Open angle with borderline findings, high risk, bilateral: Secondary | ICD-10-CM | POA: Diagnosis not present

## 2017-11-15 DIAGNOSIS — Z7984 Long term (current) use of oral hypoglycemic drugs: Secondary | ICD-10-CM | POA: Diagnosis not present

## 2017-11-15 DIAGNOSIS — E113293 Type 2 diabetes mellitus with mild nonproliferative diabetic retinopathy without macular edema, bilateral: Secondary | ICD-10-CM | POA: Diagnosis not present

## 2017-11-15 DIAGNOSIS — Z794 Long term (current) use of insulin: Secondary | ICD-10-CM | POA: Diagnosis not present

## 2017-11-18 DIAGNOSIS — E11628 Type 2 diabetes mellitus with other skin complications: Secondary | ICD-10-CM | POA: Diagnosis not present

## 2017-11-18 DIAGNOSIS — J449 Chronic obstructive pulmonary disease, unspecified: Secondary | ICD-10-CM | POA: Diagnosis not present

## 2017-11-18 DIAGNOSIS — M6281 Muscle weakness (generalized): Secondary | ICD-10-CM | POA: Diagnosis not present

## 2017-11-19 DIAGNOSIS — F0390 Unspecified dementia without behavioral disturbance: Secondary | ICD-10-CM | POA: Diagnosis not present

## 2017-11-19 DIAGNOSIS — J449 Chronic obstructive pulmonary disease, unspecified: Secondary | ICD-10-CM | POA: Diagnosis not present

## 2017-11-19 DIAGNOSIS — F331 Major depressive disorder, recurrent, moderate: Secondary | ICD-10-CM | POA: Diagnosis not present

## 2017-11-19 DIAGNOSIS — F5105 Insomnia due to other mental disorder: Secondary | ICD-10-CM | POA: Diagnosis not present

## 2017-11-19 DIAGNOSIS — E11628 Type 2 diabetes mellitus with other skin complications: Secondary | ICD-10-CM | POA: Diagnosis not present

## 2017-11-19 DIAGNOSIS — M6281 Muscle weakness (generalized): Secondary | ICD-10-CM | POA: Diagnosis not present

## 2017-11-22 DIAGNOSIS — J449 Chronic obstructive pulmonary disease, unspecified: Secondary | ICD-10-CM | POA: Diagnosis not present

## 2017-11-22 DIAGNOSIS — E11628 Type 2 diabetes mellitus with other skin complications: Secondary | ICD-10-CM | POA: Diagnosis not present

## 2017-11-22 DIAGNOSIS — M6281 Muscle weakness (generalized): Secondary | ICD-10-CM | POA: Diagnosis not present

## 2017-11-23 DIAGNOSIS — M6281 Muscle weakness (generalized): Secondary | ICD-10-CM | POA: Diagnosis not present

## 2017-11-23 DIAGNOSIS — J449 Chronic obstructive pulmonary disease, unspecified: Secondary | ICD-10-CM | POA: Diagnosis not present

## 2017-11-23 DIAGNOSIS — E11628 Type 2 diabetes mellitus with other skin complications: Secondary | ICD-10-CM | POA: Diagnosis not present

## 2017-11-24 DIAGNOSIS — M6281 Muscle weakness (generalized): Secondary | ICD-10-CM | POA: Diagnosis not present

## 2017-11-24 DIAGNOSIS — E11628 Type 2 diabetes mellitus with other skin complications: Secondary | ICD-10-CM | POA: Diagnosis not present

## 2017-11-24 DIAGNOSIS — J449 Chronic obstructive pulmonary disease, unspecified: Secondary | ICD-10-CM | POA: Diagnosis not present

## 2017-11-25 DIAGNOSIS — M6281 Muscle weakness (generalized): Secondary | ICD-10-CM | POA: Diagnosis not present

## 2017-11-25 DIAGNOSIS — J449 Chronic obstructive pulmonary disease, unspecified: Secondary | ICD-10-CM | POA: Diagnosis not present

## 2017-11-25 DIAGNOSIS — E11628 Type 2 diabetes mellitus with other skin complications: Secondary | ICD-10-CM | POA: Diagnosis not present

## 2017-11-26 DIAGNOSIS — M6281 Muscle weakness (generalized): Secondary | ICD-10-CM | POA: Diagnosis not present

## 2017-11-26 DIAGNOSIS — J449 Chronic obstructive pulmonary disease, unspecified: Secondary | ICD-10-CM | POA: Diagnosis not present

## 2017-11-26 DIAGNOSIS — E11628 Type 2 diabetes mellitus with other skin complications: Secondary | ICD-10-CM | POA: Diagnosis not present

## 2017-11-29 DIAGNOSIS — M6281 Muscle weakness (generalized): Secondary | ICD-10-CM | POA: Diagnosis not present

## 2017-11-29 DIAGNOSIS — E11628 Type 2 diabetes mellitus with other skin complications: Secondary | ICD-10-CM | POA: Diagnosis not present

## 2017-11-29 DIAGNOSIS — J449 Chronic obstructive pulmonary disease, unspecified: Secondary | ICD-10-CM | POA: Diagnosis not present

## 2017-11-30 DIAGNOSIS — M6281 Muscle weakness (generalized): Secondary | ICD-10-CM | POA: Diagnosis not present

## 2017-11-30 DIAGNOSIS — J449 Chronic obstructive pulmonary disease, unspecified: Secondary | ICD-10-CM | POA: Diagnosis not present

## 2017-11-30 DIAGNOSIS — E11628 Type 2 diabetes mellitus with other skin complications: Secondary | ICD-10-CM | POA: Diagnosis not present

## 2017-12-01 DIAGNOSIS — M6281 Muscle weakness (generalized): Secondary | ICD-10-CM | POA: Diagnosis not present

## 2017-12-01 DIAGNOSIS — E11628 Type 2 diabetes mellitus with other skin complications: Secondary | ICD-10-CM | POA: Diagnosis not present

## 2017-12-01 DIAGNOSIS — J449 Chronic obstructive pulmonary disease, unspecified: Secondary | ICD-10-CM | POA: Diagnosis not present

## 2017-12-13 DIAGNOSIS — F329 Major depressive disorder, single episode, unspecified: Secondary | ICD-10-CM | POA: Diagnosis not present

## 2017-12-13 DIAGNOSIS — M6281 Muscle weakness (generalized): Secondary | ICD-10-CM | POA: Diagnosis not present

## 2017-12-13 DIAGNOSIS — R489 Unspecified symbolic dysfunctions: Secondary | ICD-10-CM | POA: Diagnosis not present

## 2017-12-13 DIAGNOSIS — F0390 Unspecified dementia without behavioral disturbance: Secondary | ICD-10-CM | POA: Diagnosis not present

## 2017-12-14 DIAGNOSIS — R489 Unspecified symbolic dysfunctions: Secondary | ICD-10-CM | POA: Diagnosis not present

## 2017-12-14 DIAGNOSIS — M6281 Muscle weakness (generalized): Secondary | ICD-10-CM | POA: Diagnosis not present

## 2017-12-14 DIAGNOSIS — F0390 Unspecified dementia without behavioral disturbance: Secondary | ICD-10-CM | POA: Diagnosis not present

## 2017-12-14 DIAGNOSIS — F329 Major depressive disorder, single episode, unspecified: Secondary | ICD-10-CM | POA: Diagnosis not present

## 2017-12-15 DIAGNOSIS — F329 Major depressive disorder, single episode, unspecified: Secondary | ICD-10-CM | POA: Diagnosis not present

## 2017-12-15 DIAGNOSIS — M6281 Muscle weakness (generalized): Secondary | ICD-10-CM | POA: Diagnosis not present

## 2017-12-15 DIAGNOSIS — F0390 Unspecified dementia without behavioral disturbance: Secondary | ICD-10-CM | POA: Diagnosis not present

## 2017-12-15 DIAGNOSIS — R489 Unspecified symbolic dysfunctions: Secondary | ICD-10-CM | POA: Diagnosis not present

## 2017-12-16 DIAGNOSIS — F329 Major depressive disorder, single episode, unspecified: Secondary | ICD-10-CM | POA: Diagnosis not present

## 2017-12-16 DIAGNOSIS — F0390 Unspecified dementia without behavioral disturbance: Secondary | ICD-10-CM | POA: Diagnosis not present

## 2017-12-16 DIAGNOSIS — R489 Unspecified symbolic dysfunctions: Secondary | ICD-10-CM | POA: Diagnosis not present

## 2017-12-16 DIAGNOSIS — M6281 Muscle weakness (generalized): Secondary | ICD-10-CM | POA: Diagnosis not present

## 2017-12-17 DIAGNOSIS — F329 Major depressive disorder, single episode, unspecified: Secondary | ICD-10-CM | POA: Diagnosis not present

## 2017-12-17 DIAGNOSIS — F0390 Unspecified dementia without behavioral disturbance: Secondary | ICD-10-CM | POA: Diagnosis not present

## 2017-12-17 DIAGNOSIS — R489 Unspecified symbolic dysfunctions: Secondary | ICD-10-CM | POA: Diagnosis not present

## 2017-12-17 DIAGNOSIS — M6281 Muscle weakness (generalized): Secondary | ICD-10-CM | POA: Diagnosis not present

## 2017-12-20 DIAGNOSIS — F329 Major depressive disorder, single episode, unspecified: Secondary | ICD-10-CM | POA: Diagnosis not present

## 2017-12-20 DIAGNOSIS — R489 Unspecified symbolic dysfunctions: Secondary | ICD-10-CM | POA: Diagnosis not present

## 2017-12-20 DIAGNOSIS — F0390 Unspecified dementia without behavioral disturbance: Secondary | ICD-10-CM | POA: Diagnosis not present

## 2017-12-20 DIAGNOSIS — M6281 Muscle weakness (generalized): Secondary | ICD-10-CM | POA: Diagnosis not present

## 2017-12-21 DIAGNOSIS — M6281 Muscle weakness (generalized): Secondary | ICD-10-CM | POA: Diagnosis not present

## 2017-12-21 DIAGNOSIS — R489 Unspecified symbolic dysfunctions: Secondary | ICD-10-CM | POA: Diagnosis not present

## 2017-12-21 DIAGNOSIS — F0390 Unspecified dementia without behavioral disturbance: Secondary | ICD-10-CM | POA: Diagnosis not present

## 2017-12-21 DIAGNOSIS — F329 Major depressive disorder, single episode, unspecified: Secondary | ICD-10-CM | POA: Diagnosis not present

## 2017-12-22 DIAGNOSIS — R489 Unspecified symbolic dysfunctions: Secondary | ICD-10-CM | POA: Diagnosis not present

## 2017-12-22 DIAGNOSIS — F329 Major depressive disorder, single episode, unspecified: Secondary | ICD-10-CM | POA: Diagnosis not present

## 2017-12-22 DIAGNOSIS — M6281 Muscle weakness (generalized): Secondary | ICD-10-CM | POA: Diagnosis not present

## 2017-12-22 DIAGNOSIS — F0390 Unspecified dementia without behavioral disturbance: Secondary | ICD-10-CM | POA: Diagnosis not present

## 2017-12-23 DIAGNOSIS — F331 Major depressive disorder, recurrent, moderate: Secondary | ICD-10-CM | POA: Diagnosis not present

## 2017-12-23 DIAGNOSIS — R489 Unspecified symbolic dysfunctions: Secondary | ICD-10-CM | POA: Diagnosis not present

## 2017-12-23 DIAGNOSIS — F5105 Insomnia due to other mental disorder: Secondary | ICD-10-CM | POA: Diagnosis not present

## 2017-12-23 DIAGNOSIS — M6281 Muscle weakness (generalized): Secondary | ICD-10-CM | POA: Diagnosis not present

## 2017-12-23 DIAGNOSIS — F329 Major depressive disorder, single episode, unspecified: Secondary | ICD-10-CM | POA: Diagnosis not present

## 2017-12-23 DIAGNOSIS — F0390 Unspecified dementia without behavioral disturbance: Secondary | ICD-10-CM | POA: Diagnosis not present

## 2017-12-24 DIAGNOSIS — F329 Major depressive disorder, single episode, unspecified: Secondary | ICD-10-CM | POA: Diagnosis not present

## 2017-12-24 DIAGNOSIS — R489 Unspecified symbolic dysfunctions: Secondary | ICD-10-CM | POA: Diagnosis not present

## 2017-12-24 DIAGNOSIS — M6281 Muscle weakness (generalized): Secondary | ICD-10-CM | POA: Diagnosis not present

## 2017-12-24 DIAGNOSIS — F0390 Unspecified dementia without behavioral disturbance: Secondary | ICD-10-CM | POA: Diagnosis not present

## 2017-12-27 DIAGNOSIS — M6281 Muscle weakness (generalized): Secondary | ICD-10-CM | POA: Diagnosis not present

## 2017-12-27 DIAGNOSIS — R489 Unspecified symbolic dysfunctions: Secondary | ICD-10-CM | POA: Diagnosis not present

## 2017-12-27 DIAGNOSIS — F329 Major depressive disorder, single episode, unspecified: Secondary | ICD-10-CM | POA: Diagnosis not present

## 2017-12-27 DIAGNOSIS — F0390 Unspecified dementia without behavioral disturbance: Secondary | ICD-10-CM | POA: Diagnosis not present

## 2017-12-28 DIAGNOSIS — F0390 Unspecified dementia without behavioral disturbance: Secondary | ICD-10-CM | POA: Diagnosis not present

## 2017-12-28 DIAGNOSIS — M6281 Muscle weakness (generalized): Secondary | ICD-10-CM | POA: Diagnosis not present

## 2017-12-28 DIAGNOSIS — R489 Unspecified symbolic dysfunctions: Secondary | ICD-10-CM | POA: Diagnosis not present

## 2017-12-28 DIAGNOSIS — F329 Major depressive disorder, single episode, unspecified: Secondary | ICD-10-CM | POA: Diagnosis not present

## 2017-12-29 DIAGNOSIS — M6281 Muscle weakness (generalized): Secondary | ICD-10-CM | POA: Diagnosis not present

## 2017-12-29 DIAGNOSIS — R262 Difficulty in walking, not elsewhere classified: Secondary | ICD-10-CM | POA: Diagnosis not present

## 2017-12-29 DIAGNOSIS — R489 Unspecified symbolic dysfunctions: Secondary | ICD-10-CM | POA: Diagnosis not present

## 2017-12-29 DIAGNOSIS — F0390 Unspecified dementia without behavioral disturbance: Secondary | ICD-10-CM | POA: Diagnosis not present

## 2017-12-29 DIAGNOSIS — F329 Major depressive disorder, single episode, unspecified: Secondary | ICD-10-CM | POA: Diagnosis not present

## 2017-12-29 DIAGNOSIS — B351 Tinea unguium: Secondary | ICD-10-CM | POA: Diagnosis not present

## 2017-12-29 DIAGNOSIS — E1051 Type 1 diabetes mellitus with diabetic peripheral angiopathy without gangrene: Secondary | ICD-10-CM | POA: Diagnosis not present

## 2017-12-30 DIAGNOSIS — F329 Major depressive disorder, single episode, unspecified: Secondary | ICD-10-CM | POA: Diagnosis not present

## 2017-12-30 DIAGNOSIS — F0390 Unspecified dementia without behavioral disturbance: Secondary | ICD-10-CM | POA: Diagnosis not present

## 2017-12-30 DIAGNOSIS — R489 Unspecified symbolic dysfunctions: Secondary | ICD-10-CM | POA: Diagnosis not present

## 2017-12-30 DIAGNOSIS — M6281 Muscle weakness (generalized): Secondary | ICD-10-CM | POA: Diagnosis not present

## 2017-12-31 DIAGNOSIS — R489 Unspecified symbolic dysfunctions: Secondary | ICD-10-CM | POA: Diagnosis not present

## 2017-12-31 DIAGNOSIS — M6281 Muscle weakness (generalized): Secondary | ICD-10-CM | POA: Diagnosis not present

## 2017-12-31 DIAGNOSIS — F0390 Unspecified dementia without behavioral disturbance: Secondary | ICD-10-CM | POA: Diagnosis not present

## 2017-12-31 DIAGNOSIS — F329 Major depressive disorder, single episode, unspecified: Secondary | ICD-10-CM | POA: Diagnosis not present

## 2018-01-04 DIAGNOSIS — F329 Major depressive disorder, single episode, unspecified: Secondary | ICD-10-CM | POA: Diagnosis not present

## 2018-01-04 DIAGNOSIS — F0390 Unspecified dementia without behavioral disturbance: Secondary | ICD-10-CM | POA: Diagnosis not present

## 2018-01-04 DIAGNOSIS — R489 Unspecified symbolic dysfunctions: Secondary | ICD-10-CM | POA: Diagnosis not present

## 2018-01-04 DIAGNOSIS — M6281 Muscle weakness (generalized): Secondary | ICD-10-CM | POA: Diagnosis not present

## 2018-01-05 ENCOUNTER — Non-Acute Institutional Stay (SKILLED_NURSING_FACILITY): Payer: Medicare Other | Admitting: Adult Health

## 2018-01-05 ENCOUNTER — Encounter: Payer: Self-pay | Admitting: Adult Health

## 2018-01-05 DIAGNOSIS — R489 Unspecified symbolic dysfunctions: Secondary | ICD-10-CM | POA: Diagnosis not present

## 2018-01-05 DIAGNOSIS — Z794 Long term (current) use of insulin: Secondary | ICD-10-CM | POA: Diagnosis not present

## 2018-01-05 DIAGNOSIS — I10 Essential (primary) hypertension: Secondary | ICD-10-CM

## 2018-01-05 DIAGNOSIS — E118 Type 2 diabetes mellitus with unspecified complications: Secondary | ICD-10-CM | POA: Diagnosis not present

## 2018-01-05 DIAGNOSIS — M6281 Muscle weakness (generalized): Secondary | ICD-10-CM | POA: Diagnosis not present

## 2018-01-05 DIAGNOSIS — E1159 Type 2 diabetes mellitus with other circulatory complications: Secondary | ICD-10-CM

## 2018-01-05 DIAGNOSIS — J449 Chronic obstructive pulmonary disease, unspecified: Secondary | ICD-10-CM | POA: Diagnosis not present

## 2018-01-05 DIAGNOSIS — F329 Major depressive disorder, single episode, unspecified: Secondary | ICD-10-CM | POA: Diagnosis not present

## 2018-01-05 DIAGNOSIS — F0390 Unspecified dementia without behavioral disturbance: Secondary | ICD-10-CM | POA: Diagnosis not present

## 2018-01-05 NOTE — Progress Notes (Signed)
Location:   [facility] Room Number: [place] of Service:  SNF (31)   CODE STATUS: DNR (MOST up dated 10-01-17)  Allergies  Allergen Reactions  . Avelox [Moxifloxacin Hcl In Nacl] Hives  . Codeine     Reports her throat swelled in the past but has tolerated since.  . Compazine [Prochlorperazine Edisylate] Nausea And Vomiting  . Morphine And Related     Patient doesn't recall  . Mycobacterium   . Penicillins     Patient doesn't recall.  . Phenothiazines     Patient doesn't recall  . Prednisone     Insomnia, confusion.  Marland Kitchen Reglan [Metoclopramide]     Patient doesn't recall "but it was awful"  . Sulfonamide Derivatives Hives    Chief Complaint  Patient presents with  . Medical Management of Chronic Issues    Hypertension; copd; diabetes    HPI:  She is a 77 year old long term resident of this facility being seen for the management of her chronic illnesses: hypertension; copd; diabetes. She denies any headaches; no cough or shortness of breath. There are no nursing concerns at this time.   Past Medical History:  Diagnosis Date  . Abnormality of gait   . Anxiety state, unspecified   . Candidiasis of other urogenital sites   . Chronic airway obstruction, not elsewhere classified   . Chronic obstructive lung disease ([place])   . Depressive disorder, not elsewhere classified   . Esophageal reflux   . Hyperlipemia   . Lumbago   . Osteoporosis   . Spinal stenosis   . 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 Unspecified dementia without behavioral disturbance     Past Surgical History:  Procedure Laterality Date  . ABDOMINAL HYSTERECTOMY    . APPENDECTOMY    . CHOLECYSTECTOMY    . VERTEBROPLASTY      Social History   Socioeconomic History  . Marital status: Divorced    Spouse name: Not on file  . Number of children: Not on file  . Years of education: Not on file  . Highest education level: Not on file  Occupational  History  . Not on file  Social Needs  . Financial resource strain: Not on file  . Food insecurity:    Worry: Not on file    Inability: Not on file  . Transportation needs:    Medical: Not on file    Non-medical: Not on file  Tobacco Use  . Smoking status: Former Smoker    Packs/day: 1.00    Years: 20.00    Pack years: 20.00  . Smokeless tobacco: Never Used  Substance and Sexual Activity  . Alcohol use: No  . Drug use: No  . Sexual activity: Never  Lifestyle  . Physical activity:    Days per week: Not on file    Minutes per session: Not on file  . Stress: Not on file  Relationships  . Social connections:    Talks on phone: Not on file    Gets together: Not on file    Attends religious service: Not on file    Active member of club or organization: Not on file    Attends meetings of clubs or organizations: Not on file    Relationship status: Not on file  . Intimate partner violence:    Fear of current or ex partner: Not on file    Emotionally abused: Not on file  Physically abused: Not on file    Forced sexual activity: Not on file  Other Topics Concern  . Not on file  Social History Narrative  . Not on file   Family History  Family history unknown: Yes      VITAL SIGNS BP 130/62   Pulse 80   Temp 98 F (36.7 C)   Resp 16   Ht 5\' 2"  (1.575 m)   Wt 179 lb 6.4 oz (81.4 kg)   SpO2 97%   BMI 32.81 kg/m   Outpatient Encounter Medications as of 01/05/2018  Medication Sig  . acetaminophen (TYLENOL) 325 MG tablet Take 325 mg by mouth every 4 (four) hours as needed (pain).   Marland Kitchen aspirin EC 81 MG tablet Take 1 tablet (81 mg total) by mouth daily.  Marland Kitchen atorvastatin (LIPITOR) 10 MG tablet Take 10 mg by mouth daily.  . Cholecalciferol 1000 units tablet Take 2,000 Units by mouth at bedtime.   . DULoxetine (CYMBALTA) 60 MG capsule Take 60 mg by mouth daily. For depression  . fluticasone (FLONASE) 50 MCG/ACT nasal spray Place 2 sprays into both nostrils at bedtime.  .  gabapentin (NEURONTIN) 100 MG capsule Take 1 capsule (100 mg total) by mouth 3 (three) times daily.  . Guaifenesin (MUCINEX MAXIMUM STRENGTH) 1200 MG TB12 Take 1 tablet by mouth 2 (two) times daily.   . Hypromellose 0.4 % SOLN Instill 2 drops in both eyes two times a day for dry eyes.  Instill one hour prior to Tobramycin drops  . Insulin Detemir (LEVEMIR FLEXTOUCH) 100 UNIT/ML Pen Inject 30 Units into the skin at bedtime.   . Memantine HCl-Donepezil HCl (NAMZARIC) 28-10 MG CP24 Take 1 capsule by mouth every evening. For dementia  . metFORMIN (GLUCOPHAGE) 500 MG tablet Take 500 mg by mouth daily with breakfast.  . ranitidine (ZANTAC) 150 MG tablet Take 150 mg by mouth daily. In the morning and at bedtime For reflux  . sitaGLIPtin (JANUVIA) 50 MG tablet Take 50 mg by mouth daily.  . traZODone (DESYREL) 25 mg TABS tablet Take 25 mg by mouth at bedtime.   Marland Kitchen umeclidinium bromide (INCRUSE ELLIPTA) 62.5 MCG/INH AEPB Inhale 1 puff into the lungs daily.   . [DISCONTINUED] linagliptin (TRADJENTA) 5 MG TABS tablet Take 5 mg by mouth daily.   No facility-administered encounter medications on file as of 01/05/2018.      SIGNIFICANT DIAGNOSTIC EXAMS  NO NEW EXAMS    LABS REVIEWED: PREVIOUS    12-26-16: wbc 10.2; hgb 13; hct 39; mcv 87.0 plt 327; glucose 112; bun 14; creat 0.5; k+ 4.1; na++ 141; liver normal hgb a1c 7.3  12-29-16: urine culture: ESBL e-coli  03-20-17: tsh 3.10 03-22-17: vit B 12: 357; folate 9.8 06-10-17: wbc 11.4; hgb 13.1; hct 41.9; mcv 91.5; plt 288; glucose 109; bun 10.0; creat 0.56; k+ 4.1; na++ 144; ca 9.0; liver normal albumin 3.8; hgb a1c 7.2; chol 153; ldl 79; trig 176; hdl 39; urine micro-albumin 3.2  TODAY:   11-02-17: hgb a1c 6.9      Review of Systems  Constitutional: Negative for malaise/fatigue.  Respiratory: Negative for cough and shortness of breath.   Cardiovascular: Negative for chest pain, palpitations and leg swelling.  Gastrointestinal: Negative for abdominal  pain, constipation and heartburn.  Musculoskeletal: Negative for back pain, joint pain and myalgias.  Skin: Negative.   Neurological: Negative for dizziness.  Psychiatric/Behavioral: The patient is not nervous/anxious.      Physical Exam  Constitutional: She is oriented to  person, place, and time. She appears well-developed and well-nourished. No distress.  Neck: No thyromegaly present.  Cardiovascular: Normal rate, regular rhythm and intact distal pulses.  Murmur heard. 1/6  Pulmonary/Chest: Effort normal and breath sounds normal. No respiratory distress.  Abdominal: Soft. Bowel sounds are normal. She exhibits no distension. There is no tenderness.  Musculoskeletal: Normal range of motion. She exhibits no edema.  Lymphadenopathy:    She has no cervical adenopathy.  Neurological: She is alert and oriented to person, place, and time.  Skin: Skin is warm and dry. She is not diaphoretic.  Psychiatric: She has a normal mood and affect.      ASSESSMENT/ PLAN:  TODAY:   1. Type  2 diabetes mellitus with complication with long term current use of insulin: her hgb a1c is 6.9 (previous 7.2). Stable Will continue  tradjenta 5  mg daily levemir 00 units nightly and will continue metformin 500 mg daily Is on asa ; could not tolerate low does ace   2.  Hypertension associated with diabetes: b/p 130/62 stable   will continue asa 81 mg daily    Could not tolerate ACE at low dose will monitor  3. COPD: stable  will continue;mucinex 1200 mg  twice daily ; Incruse ellipta 1  puff daily  flonase 2 puffs nightlyfor allergic rhinitis    PREVIOUS     4. Hyperlipidemia associated with type 2 diabetes mellitus: stable ldl is 79; trig 176 will continue lipitor 10 mg daily will monitor her status.    5. Vascular dementia with paranioa: without change in her status; will continue namzaric 28-10 mg daily and will monitor her weight is 179 pounds.   6. 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 Chronic low back pain without sciatica: she is presently stable; will continue  neurontin 100 mg three times daily; cymbalta 60 mg daily  will monitor her status.   8. Major depressive disorder, recurrent episode: she is stable and is benefiting from cymbalta 60 mg daily (also takes for pain management) will not make changes will continue trazodone 25 mg nightly for sleep.     MD is aware of resident's narcotic use and is in agreement with current plan of care. We will attempt to wean resident as apropriate   Ok Edwards NP [facility]  Contact [phone] Monday through Friday 8am- 5pm  After hours call [phone]

## 2018-01-06 DIAGNOSIS — F329 Major depressive disorder, single episode, unspecified: Secondary | ICD-10-CM | POA: Diagnosis not present

## 2018-01-06 DIAGNOSIS — R489 Unspecified symbolic dysfunctions: Secondary | ICD-10-CM | POA: Diagnosis not present

## 2018-01-06 DIAGNOSIS — M6281 Muscle weakness (generalized): Secondary | ICD-10-CM | POA: Diagnosis not present

## 2018-01-06 DIAGNOSIS — F0390 Unspecified dementia without behavioral disturbance: Secondary | ICD-10-CM | POA: Diagnosis not present

## 2018-01-07 DIAGNOSIS — F0390 Unspecified dementia without behavioral disturbance: Secondary | ICD-10-CM | POA: Diagnosis not present

## 2018-01-07 DIAGNOSIS — F329 Major depressive disorder, single episode, unspecified: Secondary | ICD-10-CM | POA: Diagnosis not present

## 2018-01-07 DIAGNOSIS — M6281 Muscle weakness (generalized): Secondary | ICD-10-CM | POA: Diagnosis not present

## 2018-01-07 DIAGNOSIS — R489 Unspecified symbolic dysfunctions: Secondary | ICD-10-CM | POA: Diagnosis not present

## 2018-01-10 DIAGNOSIS — F0390 Unspecified dementia without behavioral disturbance: Secondary | ICD-10-CM | POA: Diagnosis not present

## 2018-01-10 DIAGNOSIS — R489 Unspecified symbolic dysfunctions: Secondary | ICD-10-CM | POA: Diagnosis not present

## 2018-01-11 DIAGNOSIS — R489 Unspecified symbolic dysfunctions: Secondary | ICD-10-CM | POA: Diagnosis not present

## 2018-01-11 DIAGNOSIS — F0390 Unspecified dementia without behavioral disturbance: Secondary | ICD-10-CM | POA: Diagnosis not present

## 2018-01-12 DIAGNOSIS — F0390 Unspecified dementia without behavioral disturbance: Secondary | ICD-10-CM | POA: Diagnosis not present

## 2018-01-12 DIAGNOSIS — R489 Unspecified symbolic dysfunctions: Secondary | ICD-10-CM | POA: Diagnosis not present

## 2018-01-13 DIAGNOSIS — R489 Unspecified symbolic dysfunctions: Secondary | ICD-10-CM | POA: Diagnosis not present

## 2018-01-13 DIAGNOSIS — F0390 Unspecified dementia without behavioral disturbance: Secondary | ICD-10-CM | POA: Diagnosis not present

## 2018-01-14 DIAGNOSIS — R489 Unspecified symbolic dysfunctions: Secondary | ICD-10-CM | POA: Diagnosis not present

## 2018-01-14 DIAGNOSIS — F0390 Unspecified dementia without behavioral disturbance: Secondary | ICD-10-CM | POA: Diagnosis not present

## 2018-01-17 DIAGNOSIS — F0390 Unspecified dementia without behavioral disturbance: Secondary | ICD-10-CM | POA: Diagnosis not present

## 2018-01-17 DIAGNOSIS — R489 Unspecified symbolic dysfunctions: Secondary | ICD-10-CM | POA: Diagnosis not present

## 2018-01-18 DIAGNOSIS — F0390 Unspecified dementia without behavioral disturbance: Secondary | ICD-10-CM | POA: Diagnosis not present

## 2018-01-18 DIAGNOSIS — R489 Unspecified symbolic dysfunctions: Secondary | ICD-10-CM | POA: Diagnosis not present

## 2018-01-18 DIAGNOSIS — D649 Anemia, unspecified: Secondary | ICD-10-CM | POA: Diagnosis not present

## 2018-01-18 DIAGNOSIS — R946 Abnormal results of thyroid function studies: Secondary | ICD-10-CM | POA: Diagnosis not present

## 2018-01-18 DIAGNOSIS — E039 Hypothyroidism, unspecified: Secondary | ICD-10-CM | POA: Diagnosis not present

## 2018-01-18 DIAGNOSIS — E785 Hyperlipidemia, unspecified: Secondary | ICD-10-CM | POA: Diagnosis not present

## 2018-01-18 DIAGNOSIS — E878 Other disorders of electrolyte and fluid balance, not elsewhere classified: Secondary | ICD-10-CM | POA: Diagnosis not present

## 2018-01-18 LAB — BASIC METABOLIC PANEL
BUN: 16 (ref 4–21)
Creatinine: 0.5 (ref 0.5–1.1)
GLUCOSE: 168
Potassium: 4.3 (ref 3.4–5.3)
SODIUM: 144 (ref 137–147)

## 2018-01-18 LAB — LIPID PANEL
Cholesterol: 145 (ref 0–200)
HDL: 46 (ref 35–70)
LDL Cholesterol: 75
Triglycerides: 120 (ref 40–160)

## 2018-01-18 LAB — HEPATIC FUNCTION PANEL
ALT: 7 (ref 7–35)
AST: 9 — AB (ref 13–35)
Alkaline Phosphatase: 80 (ref 25–125)
BILIRUBIN, TOTAL: 0.2

## 2018-01-18 LAB — CBC AND DIFFERENTIAL
HCT: 36 (ref 36–46)
Hemoglobin: 11.8 — AB (ref 12.0–16.0)
NEUTROS ABS: 7
Platelets: 315 (ref 150–399)
WBC: 10.6

## 2018-01-18 LAB — TSH: TSH: 2.2 (ref 0.41–5.90)

## 2018-01-19 DIAGNOSIS — R489 Unspecified symbolic dysfunctions: Secondary | ICD-10-CM | POA: Diagnosis not present

## 2018-01-19 DIAGNOSIS — F0390 Unspecified dementia without behavioral disturbance: Secondary | ICD-10-CM | POA: Diagnosis not present

## 2018-01-20 DIAGNOSIS — F0390 Unspecified dementia without behavioral disturbance: Secondary | ICD-10-CM | POA: Diagnosis not present

## 2018-01-20 DIAGNOSIS — R489 Unspecified symbolic dysfunctions: Secondary | ICD-10-CM | POA: Diagnosis not present

## 2018-01-21 DIAGNOSIS — R489 Unspecified symbolic dysfunctions: Secondary | ICD-10-CM | POA: Diagnosis not present

## 2018-01-21 DIAGNOSIS — F0390 Unspecified dementia without behavioral disturbance: Secondary | ICD-10-CM | POA: Diagnosis not present

## 2018-01-24 DIAGNOSIS — F0390 Unspecified dementia without behavioral disturbance: Secondary | ICD-10-CM | POA: Diagnosis not present

## 2018-01-24 DIAGNOSIS — R489 Unspecified symbolic dysfunctions: Secondary | ICD-10-CM | POA: Diagnosis not present

## 2018-01-25 DIAGNOSIS — R489 Unspecified symbolic dysfunctions: Secondary | ICD-10-CM | POA: Diagnosis not present

## 2018-01-25 DIAGNOSIS — F0390 Unspecified dementia without behavioral disturbance: Secondary | ICD-10-CM | POA: Diagnosis not present

## 2018-01-26 DIAGNOSIS — F0390 Unspecified dementia without behavioral disturbance: Secondary | ICD-10-CM | POA: Diagnosis not present

## 2018-01-26 DIAGNOSIS — R489 Unspecified symbolic dysfunctions: Secondary | ICD-10-CM | POA: Diagnosis not present

## 2018-01-27 DIAGNOSIS — R489 Unspecified symbolic dysfunctions: Secondary | ICD-10-CM | POA: Diagnosis not present

## 2018-01-27 DIAGNOSIS — F0390 Unspecified dementia without behavioral disturbance: Secondary | ICD-10-CM | POA: Diagnosis not present

## 2018-01-28 DIAGNOSIS — R489 Unspecified symbolic dysfunctions: Secondary | ICD-10-CM | POA: Diagnosis not present

## 2018-01-28 DIAGNOSIS — F0390 Unspecified dementia without behavioral disturbance: Secondary | ICD-10-CM | POA: Diagnosis not present

## 2018-01-31 DIAGNOSIS — F0390 Unspecified dementia without behavioral disturbance: Secondary | ICD-10-CM | POA: Diagnosis not present

## 2018-01-31 DIAGNOSIS — R489 Unspecified symbolic dysfunctions: Secondary | ICD-10-CM | POA: Diagnosis not present

## 2018-02-01 DIAGNOSIS — F5105 Insomnia due to other mental disorder: Secondary | ICD-10-CM | POA: Diagnosis not present

## 2018-02-01 DIAGNOSIS — F331 Major depressive disorder, recurrent, moderate: Secondary | ICD-10-CM | POA: Diagnosis not present

## 2018-02-01 DIAGNOSIS — R489 Unspecified symbolic dysfunctions: Secondary | ICD-10-CM | POA: Diagnosis not present

## 2018-02-01 DIAGNOSIS — F0390 Unspecified dementia without behavioral disturbance: Secondary | ICD-10-CM | POA: Diagnosis not present

## 2018-02-02 ENCOUNTER — Encounter: Payer: Self-pay | Admitting: Adult Health

## 2018-02-02 ENCOUNTER — Non-Acute Institutional Stay (SKILLED_NURSING_FACILITY): Payer: Medicare Other | Admitting: Adult Health

## 2018-02-02 DIAGNOSIS — F0152 Vascular dementia, unspecified severity, with psychotic disturbance: Secondary | ICD-10-CM

## 2018-02-02 DIAGNOSIS — F0390 Unspecified dementia without behavioral disturbance: Secondary | ICD-10-CM | POA: Diagnosis not present

## 2018-02-02 DIAGNOSIS — F0150 Vascular dementia without behavioral disturbance: Secondary | ICD-10-CM

## 2018-02-02 DIAGNOSIS — K219 Gastro-esophageal reflux disease without esophagitis: Secondary | ICD-10-CM

## 2018-02-02 DIAGNOSIS — R489 Unspecified symbolic dysfunctions: Secondary | ICD-10-CM | POA: Diagnosis not present

## 2018-02-02 DIAGNOSIS — E785 Hyperlipidemia, unspecified: Secondary | ICD-10-CM | POA: Diagnosis not present

## 2018-02-02 DIAGNOSIS — E1169 Type 2 diabetes mellitus with other specified complication: Secondary | ICD-10-CM | POA: Diagnosis not present

## 2018-02-02 NOTE — Progress Notes (Signed)
Location:   [facility] Room Number: [place] of Service:  SNF (31)   CODE STATUS: DNR (MOST up dated 10-01-17)  Allergies  Allergen Reactions  . Avelox [Moxifloxacin Hcl In Nacl] Hives  . Codeine     Reports her throat swelled in the past but has tolerated since.  . Compazine [Prochlorperazine Edisylate] Nausea And Vomiting  . Morphine And Related     Patient doesn't recall  . Mycobacterium   . Penicillins     Patient doesn't recall.  . Phenothiazines     Patient doesn't recall  . Prednisone     Insomnia, confusion.  Marland Kitchen Reglan [Metoclopramide]     Patient doesn't recall "but it was awful"  . Sulfonamide Derivatives Hives    Chief Complaint  Patient presents with  . Medical Management of Chronic Issues    Gerd; hyperlipidemia; dementia     HPI:  She is a 77 year old long term resident of this facility being seen for the management of her chronic illnesses: gerd; hyperlipidemia; dementia. She denies any chest pain; no heart burn; no change in appetite. There are no nursing concerns at this time.   Past Medical History:  Diagnosis Date  . Abnormality of gait   . Anxiety state, unspecified   . Candidiasis of other urogenital sites   . Chronic airway obstruction, not elsewhere classified   . Chronic obstructive lung disease ([place])   . Depressive disorder, not elsewhere classified   . Esophageal reflux   . Hyperlipemia   . Lumbago   . Osteoporosis   . Spinal stenosis   . 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 Unspecified dementia without behavioral disturbance     Past Surgical History:  Procedure Laterality Date  . ABDOMINAL HYSTERECTOMY    . APPENDECTOMY    . CHOLECYSTECTOMY    . VERTEBROPLASTY      Social History   Socioeconomic History  . Marital status: Divorced    Spouse name: Not on file  . Number of children: Not on file  . Years of education: Not on file  . Highest education level: Not on file    Occupational History  . Not on file  Social Needs  . Financial resource strain: Not on file  . Food insecurity:    Worry: Not on file    Inability: Not on file  . Transportation needs:    Medical: Not on file    Non-medical: Not on file  Tobacco Use  . Smoking status: Former Smoker    Packs/day: 1.00    Years: 20.00    Pack years: 20.00  . Smokeless tobacco: Never Used  Substance and Sexual Activity  . Alcohol use: No  . Drug use: No  . Sexual activity: Never  Lifestyle  . Physical activity:    Days per week: Not on file    Minutes per session: Not on file  . Stress: Not on file  Relationships  . Social connections:    Talks on phone: Not on file    Gets together: Not on file    Attends religious service: Not on file    Active member of club or organization: Not on file    Attends meetings of clubs or organizations: Not on file    Relationship status: Not on file  . Intimate partner violence:    Fear of current or ex partner: Not on file    Emotionally abused: Not on  file    Physically abused: Not on file    Forced sexual activity: Not on file  Other Topics Concern  . Not on file  Social History Narrative  . Not on file   Family History  Family history unknown: Yes      VITAL SIGNS BP 122/64   Pulse 76   Temp (!) 97.5 F (36.4 C)   Resp 18   Ht 5\' 2"  (1.575 m)   Wt 182 lb 3.2 oz (82.6 kg)   SpO2 97%   BMI 33.32 kg/m   Outpatient Encounter Medications as of 02/02/2018  Medication Sig  . acetaminophen (TYLENOL) 325 MG tablet Take 325 mg by mouth every 4 (four) hours as needed (pain).   Marland Kitchen aspirin EC 81 MG tablet Take 1 tablet (81 mg total) by mouth daily.  Marland Kitchen atorvastatin (LIPITOR) 10 MG tablet Take 10 mg by mouth daily.  . Cholecalciferol 1000 units tablet Take 2,000 Units by mouth at bedtime.   . DULoxetine (CYMBALTA) 60 MG capsule Take 60 mg by mouth daily. For depression  . fluticasone (FLONASE) 50 MCG/ACT nasal spray Place 2 sprays into both  nostrils at bedtime.  . gabapentin (NEURONTIN) 100 MG capsule Take 1 capsule (100 mg total) by mouth 3 (three) times daily.  . Guaifenesin (MUCINEX MAXIMUM STRENGTH) 1200 MG TB12 Take 1 tablet by mouth 2 (two) times daily.   . Hypromellose 0.4 % SOLN Instill 2 drops in both eyes two times a day for dry eyes.  Instill one hour prior to Tobramycin drops  . Insulin Detemir (LEVEMIR FLEXTOUCH) 100 UNIT/ML Pen Inject 30 Units into the skin at bedtime.   Marland Kitchen linagliptin (TRADJENTA) 5 MG TABS tablet Take 5 mg by mouth daily.  . Melatonin 3 MG TABS Take 1 tablet by mouth at bedtime.  . Memantine HCl-Donepezil HCl (NAMZARIC) 28-10 MG CP24 Take 1 capsule by mouth every evening. For dementia  . metFORMIN (GLUCOPHAGE) 500 MG tablet Take 500 mg by mouth daily with breakfast.  . ranitidine (ZANTAC) 150 MG tablet Take 150 mg by mouth daily. In the morning and at bedtime For reflux  . traZODone (DESYREL) 50 MG tablet Take 50 mg by mouth at bedtime.  Marland Kitchen umeclidinium bromide (INCRUSE ELLIPTA) 62.5 MCG/INH AEPB Inhale 1 puff into the lungs daily.   . [DISCONTINUED] sitaGLIPtin (JANUVIA) 50 MG tablet Take 50 mg by mouth daily.  . [DISCONTINUED] traZODone (DESYREL) 25 mg TABS tablet Take 25 mg by mouth at bedtime.    No facility-administered encounter medications on file as of 02/02/2018.      SIGNIFICANT DIAGNOSTIC EXAMS  NO NEW EXAMS    LABS REVIEWED: PREVIOUS    03-20-17: tsh 3.10 03-22-17: vit B 12: 357; folate 9.8 06-10-17: wbc 11.4; hgb 13.1; hct 41.9; mcv 91.5; plt 288; glucose 109; bun 10.0; creat 0.56; k+ 4.1; na++ 144; ca 9.0; liver normal albumin 3.8; hgb a1c 7.2; chol 153; ldl 79; trig 176; hdl 39; urine micro-albumin 3.2 11-02-17: hgb a1c 6.9  TODAY:   01-18-18:  wbc 10.6; hgb 11.8; hct 36; plt 315; glucose 168; bun 16; creat 0.5; k+ 4.3; na++ 144; liver choll 145; ldl 75 trig 120; hdl 46; tsh 2.20   Review of Systems  Constitutional: Negative for malaise/fatigue.  Respiratory: Negative for  cough and shortness of breath.   Cardiovascular: Negative for chest pain, palpitations and leg swelling.  Gastrointestinal: Negative for abdominal pain, constipation and heartburn.  Musculoskeletal: Negative for back pain, joint pain and myalgias.  Skin: Negative.   Neurological: Negative for dizziness.  Psychiatric/Behavioral: The patient is not nervous/anxious.     Physical Exam  Constitutional: She is oriented to person, place, and time. She appears well-developed and well-nourished. No distress.  Neck: No thyromegaly present.  Cardiovascular: Normal rate, regular rhythm and intact distal pulses.  Murmur heard. 1/6  Abdominal: Soft. Bowel sounds are normal. She exhibits no distension. There is no tenderness.  Musculoskeletal: Normal range of motion. She exhibits no edema.  Lymphadenopathy:    She has no cervical adenopathy.  Neurological: She is alert and oriented to person, place, and time.  Skin: Skin is warm and dry. She is not diaphoretic.  Psychiatric: She has a normal mood and affect.       ASSESSMENT/ PLAN:  TODAY:   1. Hyperlipidemia associated with type 2 diabetes mellitus: stable ldl is 75; trig 120 will continue lipitor 10 mg daily will monitor her status.    2. Vascular dementia with paranioa: without change in her status; will continue namzaric 28-10 mg daily and will monitor her weight is 182 pounds.   3. 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 Chronic low back pain without sciatica: she is presently stable; will continue  neurontin 100 mg three times daily; cymbalta 60 mg daily  will monitor her status.   5. Major depressive disorder, recurrent episode: she is stable and is benefiting from cymbalta 60 mg daily (also takes for pain management) will not make changes will continue trazodone 25 mg nightly for sleep.   6. Type  2 diabetes mellitus with complication with long term current use of insulin: her hgb  a1c is 6.9 (previous 7.2). Stable Will continue  tradjenta 5  mg daily levemir 00 units nightly and will continue metformin 500 mg daily Is on asa ; could not tolerate low does ace   7.  Hypertension associated with diabetes: b/p 122/64 stable   will continue asa 81 mg daily    Could not tolerate ACE at low dose will monitor  8. COPD: stable  will continue;mucinex 1200 mg  twice daily ; Incruse ellipta 1  puff daily  flonase 2 puffs nightlyfor allergic rhinitis      MD is aware of resident's narcotic use and is in agreement with current plan of care. We will attempt to wean resident as apropriate   Ok Edwards NP [facility]  Contact [phone] Monday through Friday 8am- 5pm  After hours call [phone]

## 2018-02-03 DIAGNOSIS — R489 Unspecified symbolic dysfunctions: Secondary | ICD-10-CM | POA: Diagnosis not present

## 2018-02-03 DIAGNOSIS — F0390 Unspecified dementia without behavioral disturbance: Secondary | ICD-10-CM | POA: Diagnosis not present

## 2018-02-04 ENCOUNTER — Encounter: Payer: Self-pay | Admitting: Adult Health

## 2018-02-04 DIAGNOSIS — F0390 Unspecified dementia without behavioral disturbance: Secondary | ICD-10-CM | POA: Diagnosis not present

## 2018-02-04 DIAGNOSIS — R489 Unspecified symbolic dysfunctions: Secondary | ICD-10-CM | POA: Diagnosis not present

## 2018-02-04 NOTE — Progress Notes (Signed)
Entered in error

## 2018-02-06 DIAGNOSIS — R489 Unspecified symbolic dysfunctions: Secondary | ICD-10-CM | POA: Diagnosis not present

## 2018-02-06 DIAGNOSIS — F0390 Unspecified dementia without behavioral disturbance: Secondary | ICD-10-CM | POA: Diagnosis not present

## 2018-02-07 DIAGNOSIS — R489 Unspecified symbolic dysfunctions: Secondary | ICD-10-CM | POA: Diagnosis not present

## 2018-02-07 DIAGNOSIS — M6281 Muscle weakness (generalized): Secondary | ICD-10-CM | POA: Diagnosis not present

## 2018-02-07 DIAGNOSIS — F0390 Unspecified dementia without behavioral disturbance: Secondary | ICD-10-CM | POA: Diagnosis not present

## 2018-02-11 DIAGNOSIS — E785 Hyperlipidemia, unspecified: Secondary | ICD-10-CM | POA: Diagnosis not present

## 2018-02-11 DIAGNOSIS — Z79899 Other long term (current) drug therapy: Secondary | ICD-10-CM | POA: Diagnosis not present

## 2018-02-11 DIAGNOSIS — E559 Vitamin D deficiency, unspecified: Secondary | ICD-10-CM | POA: Diagnosis not present

## 2018-02-11 LAB — LIPID PANEL
Cholesterol: 171 (ref 0–200)
HDL: 48 (ref 35–70)
LDL CALC: 94
Triglycerides: 146 (ref 40–160)

## 2018-02-11 LAB — VITAMIN D 25 HYDROXY (VIT D DEFICIENCY, FRACTURES): VIT D 25 HYDROXY: 31.52

## 2018-02-17 DIAGNOSIS — F0390 Unspecified dementia without behavioral disturbance: Secondary | ICD-10-CM | POA: Diagnosis not present

## 2018-02-17 DIAGNOSIS — F331 Major depressive disorder, recurrent, moderate: Secondary | ICD-10-CM | POA: Diagnosis not present

## 2018-02-17 DIAGNOSIS — F5105 Insomnia due to other mental disorder: Secondary | ICD-10-CM | POA: Diagnosis not present

## 2018-03-01 DIAGNOSIS — M6281 Muscle weakness (generalized): Secondary | ICD-10-CM | POA: Diagnosis not present

## 2018-03-01 DIAGNOSIS — R489 Unspecified symbolic dysfunctions: Secondary | ICD-10-CM | POA: Diagnosis not present

## 2018-03-01 DIAGNOSIS — F0390 Unspecified dementia without behavioral disturbance: Secondary | ICD-10-CM | POA: Diagnosis not present

## 2018-03-03 DIAGNOSIS — R489 Unspecified symbolic dysfunctions: Secondary | ICD-10-CM | POA: Diagnosis not present

## 2018-03-03 DIAGNOSIS — F0390 Unspecified dementia without behavioral disturbance: Secondary | ICD-10-CM | POA: Diagnosis not present

## 2018-03-03 DIAGNOSIS — M6281 Muscle weakness (generalized): Secondary | ICD-10-CM | POA: Diagnosis not present

## 2018-03-04 ENCOUNTER — Non-Acute Institutional Stay (SKILLED_NURSING_FACILITY): Payer: Medicare Other | Admitting: Adult Health

## 2018-03-04 ENCOUNTER — Encounter: Payer: Self-pay | Admitting: Adult Health

## 2018-03-04 DIAGNOSIS — F0152 Vascular dementia, unspecified severity, with psychotic disturbance: Secondary | ICD-10-CM

## 2018-03-04 DIAGNOSIS — E1169 Type 2 diabetes mellitus with other specified complication: Secondary | ICD-10-CM | POA: Diagnosis not present

## 2018-03-04 DIAGNOSIS — E118 Type 2 diabetes mellitus with unspecified complications: Secondary | ICD-10-CM

## 2018-03-04 DIAGNOSIS — E785 Hyperlipidemia, unspecified: Secondary | ICD-10-CM

## 2018-03-04 DIAGNOSIS — K219 Gastro-esophageal reflux disease without esophagitis: Secondary | ICD-10-CM | POA: Diagnosis not present

## 2018-03-04 DIAGNOSIS — E1159 Type 2 diabetes mellitus with other circulatory complications: Secondary | ICD-10-CM

## 2018-03-04 DIAGNOSIS — Z794 Long term (current) use of insulin: Secondary | ICD-10-CM | POA: Diagnosis not present

## 2018-03-04 DIAGNOSIS — F0150 Vascular dementia without behavioral disturbance: Secondary | ICD-10-CM

## 2018-03-04 DIAGNOSIS — G8929 Other chronic pain: Secondary | ICD-10-CM

## 2018-03-04 DIAGNOSIS — F339 Major depressive disorder, recurrent, unspecified: Secondary | ICD-10-CM

## 2018-03-04 DIAGNOSIS — M545 Low back pain, unspecified: Secondary | ICD-10-CM

## 2018-03-04 DIAGNOSIS — J449 Chronic obstructive pulmonary disease, unspecified: Secondary | ICD-10-CM

## 2018-03-04 DIAGNOSIS — I152 Hypertension secondary to endocrine disorders: Secondary | ICD-10-CM

## 2018-03-04 DIAGNOSIS — I10 Essential (primary) hypertension: Secondary | ICD-10-CM

## 2018-03-04 NOTE — Progress Notes (Signed)
Provider:  Ok Edwards, NP Location:  [place] Room Number: [place] of Service:  SNF ([phone])   PCP: Gildardo Cranker, DO Patient Care Team: Gildardo Cranker, DO as PCP - General (Internal Medicine) Nyoka Cowden Phylis Bougie, NP as Nurse Practitioner ([place], [place] ([place])  Extended Emergency Contact Information Primary Emergency Contact: [name]) Address: [place]          [name] Phone: [phone] Relation: Son Secondary Emergency Contact: Charletta Cousin [place] of [place] Phone: [phone] Work Phone: [phone] Relation: Son  Code Status: DNR (MOST up dated 10-01-17) Goals of Care: Advanced Directive information Advanced Directives 03/04/2018  Does Patient Have a Medical Advance Directive? Yes  Type of Advance Directive Out of facility DNR (pink MOST or yellow form)  Does patient want to make changes to medical advance directive? No - Patient declined  Copy of [place] in Chart? -  Would patient like information on creating a medical advance directive? -  Pre-existing out of facility DNR order (yellow form or pink MOST form) Pink MOST form placed in chart (order not valid for inpatient use);Yellow form placed in chart (order not valid for inpatient use)      Allergies  Allergen Reactions  . Avelox [Moxifloxacin Hcl In Nacl] Hives  . Codeine     Reports her throat swelled in the past but has tolerated since.  . Compazine [Prochlorperazine Edisylate] Nausea And Vomiting  . Morphine And Related     Patient doesn't recall  . Mycobacterium   . Penicillins     Patient doesn't recall.  . Phenothiazines     Patient doesn't recall  . Prednisone     Insomnia, confusion.  Marland Kitchen Reglan [Metoclopramide]     Patient doesn't recall "but it was awful"  . Sulfonamide Derivatives Hives     Chief Complaint  Patient presents  with  . Annual Exam        HPI: Patient is a 77 y.o. female seen today for an annual comprehensive examination. She has not been hospitalized over the past year. Her status remains without significant change over the past year. She denies any uncontrolled pain; no change in appetite; no feelings of hopelessness present. There are no nursing concerns at this time.    Past Medical History:  Diagnosis Date  . Abnormality of gait   . Anxiety state, unspecified   . Candidiasis of other urogenital sites   . Chronic airway obstruction, not elsewhere classified   . Chronic obstructive lung disease ([place])   . Depressive disorder, not elsewhere classified   . Esophageal reflux   . Hyperlipemia   . Lumbago   . Osteoporosis   . Spinal stenosis   . 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 Unspecified dementia without behavioral disturbance    Past Surgical History:  Procedure Laterality Date  . ABDOMINAL HYSTERECTOMY    . APPENDECTOMY    . CHOLECYSTECTOMY    . VERTEBROPLASTY      reports that she has quit smoking. She has a 20.00 pack-year smoking history. She has never used smokeless tobacco. She reports that she does not drink alcohol or use drugs. Social History   Socioeconomic History  . Marital status: Divorced    Spouse name: Not on file  . Number of children: Not on file  . Years of education: Not on file  . Highest education  level: Not on file  Occupational History  . Not on file  Social Needs  . Financial resource strain: Not on file  . Food insecurity:    Worry: Not on file    Inability: Not on file  . Transportation needs:    Medical: Not on file    Non-medical: Not on file  Tobacco Use  . Smoking status: Former Smoker    Packs/day: 1.00    Years: 20.00    Pack years: 20.00  . Smokeless tobacco: Never Used  Substance and Sexual Activity  . Alcohol use: No  . Drug use: No  . Sexual activity: Never  Lifestyle  . Physical activity:     Days per week: Not on file    Minutes per session: Not on file  . Stress: Not on file  Relationships  . Social connections:    Talks on phone: Not on file    Gets together: Not on file    Attends religious service: Not on file    Active member of club or organization: Not on file    Attends meetings of clubs or organizations: Not on file    Relationship status: Not on file  . Intimate partner violence:    Fear of current or ex partner: Not on file    Emotionally abused: Not on file    Physically abused: Not on file    Forced sexual activity: Not on file  Other Topics Concern  . Not on file  Social History Narrative  . Not on file   Family History  Family history unknown: Yes    Vitals:   03/04/18 1001  BP: 127/68  Pulse: 85  Resp: 18  Temp: (!) 97.5 F (36.4 C)  SpO2: 97%  Weight: 182 lb 3.2 oz (82.6 kg)  Height: 5\' 2"  (1.575 m)   Body mass index is 33.32 kg/m.    Medication List        Accurate as of 03/04/18 10:06 AM. Always use your most recent med list.          acetaminophen 325 MG tablet Commonly known as:  TYLENOL Take 325 mg by mouth every 4 (four) hours as needed (pain).   aspirin EC 81 MG tablet Take 1 tablet (81 mg total) by mouth daily.   atorvastatin 10 MG tablet Commonly known as:  LIPITOR Take 10 mg by mouth daily.   Cholecalciferol 1000 units tablet Take 2,000 Units by mouth at bedtime.   DULoxetine 60 MG capsule Commonly known as:  CYMBALTA Take 60 mg by mouth daily. For depression   fluticasone 50 MCG/ACT nasal spray Commonly known as:  FLONASE Place 2 sprays into both nostrils at bedtime.   gabapentin 100 MG capsule Commonly known as:  NEURONTIN Take 1 capsule (100 mg total) by mouth 3 (three) times daily.   Hypromellose 0.4 % Soln Instill 2 drops in both eyes two times a day for dry eyes.  Instill one hour prior to Tobramycin drops   INCRUSE ELLIPTA 62.5 MCG/INH Aepb Generic drug:  umeclidinium bromide Inhale 1 puff  into the lungs daily.   LEVEMIR FLEXTOUCH 100 UNIT/ML Pen Generic drug:  Insulin Detemir Inject 30 Units into the skin at bedtime.   Melatonin 3 MG Tabs Take 1 tablet by mouth at bedtime.   metFORMIN 500 MG tablet Commonly known as:  GLUCOPHAGE Take 500 mg by mouth daily with breakfast.   MUCINEX MAXIMUM STRENGTH 1200 MG Tb12 Generic drug:  Guaifenesin Take 1 tablet  by mouth 2 (two) times daily.   NAMZARIC 28-10 MG Cp24 Generic drug:  Memantine HCl-Donepezil HCl Take 1 capsule by mouth every evening. For dementia   ranitidine 150 MG tablet Commonly known as:  ZANTAC Take 150 mg by mouth daily. In the morning and at bedtime For reflux   TRADJENTA 5 MG Tabs tablet Generic drug:  linagliptin Take 5 mg by mouth daily.   traZODone 50 MG tablet Commonly known as:  DESYREL Take 50 mg by mouth at bedtime.        SIGNIFICANT DIAGNOSTIC EXAMS NO NEW EXAMS    LABS REVIEWED: PREVIOUS    03-20-17: tsh 3.10 03-22-17: vit B 12: 357; folate 9.8 06-10-17: wbc 11.4; hgb 13.1; hct 41.9; mcv 91.5; plt 288; glucose 109; bun 10.0; creat 0.56; k+ 4.1; na++ 144; ca 9.0; liver normal albumin 3.8; hgb a1c 7.2; chol 153; ldl 79; trig 176; hdl 39; urine micro-albumin 3.2 11-02-17: hgb a1c 6.9 01-18-18:  wbc 10.6; hgb 11.8; hct 36; plt 315; glucose 168; bun 16; creat 0.5; k+ 4.3; na++ 144; liver choll 145; ldl 75 trig 120; hdl 46; tsh 2.20  NO NEW LABS.    Review of Systems  Constitutional: Negative for malaise/fatigue.  Respiratory: Negative for cough and shortness of breath.   Cardiovascular: Negative for chest pain, palpitations and leg swelling.  Gastrointestinal: Negative for abdominal pain, constipation and heartburn.  Musculoskeletal: Negative for back pain, joint pain and myalgias.  Skin: Negative.   Neurological: Negative for dizziness.  Psychiatric/Behavioral: The patient is not nervous/anxious.     Physical Exam  Constitutional: She is oriented to person, place, and time. She  appears well-developed and well-nourished. No distress.  HENT:  Head: Normocephalic.  Mouth/Throat: Oropharynx is clear and moist.  Eyes: Conjunctivae are normal.  Neck: No thyromegaly present.  Cardiovascular: Normal rate, regular rhythm and intact distal pulses.  Murmur heard. 1/6  Pulmonary/Chest: Effort normal and breath sounds normal. No respiratory distress.  Abdominal: Soft. Bowel sounds are normal. She exhibits no distension. There is no tenderness.  Musculoskeletal: Normal range of motion. She exhibits no edema.  Lymphadenopathy:    She has no cervical adenopathy.  Neurological: She is alert and oriented to person, place, and time.  Skin: Skin is warm and dry. She is not diaphoretic.  Psychiatric: She has a normal mood and affect.     ASSESSMENT/ PLAN:  TODAY:   1. Hyperlipidemia associated with type 2 diabetes mellitus: stable ldl is 75; trig 120 will continue lipitor 10 mg daily will monitor her status.    2. Vascular dementia with paranioa: without change in her status; will continue namzaric 28-10 mg daily and will monitor her weight is 182 pounds.   3. 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 Chronic low back pain without sciatica: she is presently stable; will continue  neurontin 100 mg three times daily; cymbalta 60 mg daily  will monitor her status.   5. Major depressive disorder, recurrent episode: she is stable and is benefiting from cymbalta 60 mg daily (also takes for pain management) will not make changes will continue trazodone 25 mg nightly for sleep.   6. Type  2 diabetes mellitus with complication with long term current use of insulin: her hgb a1c is 6.9 (previous 7.2). Stable Will continue  tradjenta 5  mg daily levemir 00 units nightly and will continue metformin 500 mg daily Is on asa ; could not tolerate low does ace  7.  Hypertension associated with diabetes: b/p 127/68 stable   will continue asa 81 mg daily     Could not tolerate ACE at low dose will monitor  8. COPD: stable  will continue;mucinex 1200 mg  twice daily ; Incruse ellipta 1  puff daily  flonase 2 puffs nightlyfor allergic rhinitis   Health maintenance is up to date.    MD is aware of resident's narcotic use and is in agreement with current plan of care. We will wean dosage as appropriate for resident   Ok Edwards NP [facility]  Contact [phone] Monday through Friday 8am- 5pm  After hours call [phone]

## 2018-03-09 DIAGNOSIS — M6281 Muscle weakness (generalized): Secondary | ICD-10-CM | POA: Diagnosis not present

## 2018-03-09 DIAGNOSIS — F0390 Unspecified dementia without behavioral disturbance: Secondary | ICD-10-CM | POA: Diagnosis not present

## 2018-03-09 DIAGNOSIS — R489 Unspecified symbolic dysfunctions: Secondary | ICD-10-CM | POA: Diagnosis not present

## 2018-03-10 DIAGNOSIS — F0390 Unspecified dementia without behavioral disturbance: Secondary | ICD-10-CM | POA: Diagnosis not present

## 2018-03-10 DIAGNOSIS — M6281 Muscle weakness (generalized): Secondary | ICD-10-CM | POA: Diagnosis not present

## 2018-03-12 DIAGNOSIS — F0390 Unspecified dementia without behavioral disturbance: Secondary | ICD-10-CM | POA: Diagnosis not present

## 2018-03-12 DIAGNOSIS — M6281 Muscle weakness (generalized): Secondary | ICD-10-CM | POA: Diagnosis not present

## 2018-03-14 DIAGNOSIS — M6281 Muscle weakness (generalized): Secondary | ICD-10-CM | POA: Diagnosis not present

## 2018-03-14 DIAGNOSIS — F0390 Unspecified dementia without behavioral disturbance: Secondary | ICD-10-CM | POA: Diagnosis not present

## 2018-03-15 DIAGNOSIS — F0390 Unspecified dementia without behavioral disturbance: Secondary | ICD-10-CM | POA: Diagnosis not present

## 2018-03-15 DIAGNOSIS — M6281 Muscle weakness (generalized): Secondary | ICD-10-CM | POA: Diagnosis not present

## 2018-03-16 DIAGNOSIS — F0390 Unspecified dementia without behavioral disturbance: Secondary | ICD-10-CM | POA: Diagnosis not present

## 2018-03-16 DIAGNOSIS — M6281 Muscle weakness (generalized): Secondary | ICD-10-CM | POA: Diagnosis not present

## 2018-03-17 DIAGNOSIS — M6281 Muscle weakness (generalized): Secondary | ICD-10-CM | POA: Diagnosis not present

## 2018-03-17 DIAGNOSIS — F5105 Insomnia due to other mental disorder: Secondary | ICD-10-CM | POA: Diagnosis not present

## 2018-03-17 DIAGNOSIS — B351 Tinea unguium: Secondary | ICD-10-CM | POA: Diagnosis not present

## 2018-03-17 DIAGNOSIS — F331 Major depressive disorder, recurrent, moderate: Secondary | ICD-10-CM | POA: Diagnosis not present

## 2018-03-17 DIAGNOSIS — F0390 Unspecified dementia without behavioral disturbance: Secondary | ICD-10-CM | POA: Diagnosis not present

## 2018-03-17 DIAGNOSIS — E1051 Type 1 diabetes mellitus with diabetic peripheral angiopathy without gangrene: Secondary | ICD-10-CM | POA: Diagnosis not present

## 2018-03-21 ENCOUNTER — Non-Acute Institutional Stay (SKILLED_NURSING_FACILITY): Payer: Medicare Other

## 2018-03-21 DIAGNOSIS — M6281 Muscle weakness (generalized): Secondary | ICD-10-CM | POA: Diagnosis not present

## 2018-03-21 DIAGNOSIS — Z0000 Encounter for general adult medical examination without abnormal findings: Secondary | ICD-10-CM | POA: Diagnosis not present

## 2018-03-21 DIAGNOSIS — F0390 Unspecified dementia without behavioral disturbance: Secondary | ICD-10-CM | POA: Diagnosis not present

## 2018-03-21 NOTE — Progress Notes (Signed)
Subjective:   [name] [name] is a 77 y.o. female who presents for Medicare Annual (Subsequent) preventive examination at [facility]  Last AWV-03/08/2017    Objective:     Vitals: BP 125/67 (BP Location: Left Arm, Patient Position: Supine)   Pulse 82   Temp 97.6 F (36.4 C) (Oral)   Ht 5\' 2"  (1.575 m)   Wt 182 lb (82.6 kg)   BMI 33.29 kg/m   Body mass index is 33.29 kg/m.  Advanced Directives 03/21/2018 03/04/2018 02/04/2018 02/02/2018 01/05/2018 11/01/2017 09/24/2017  Does Patient Have a Medical Advance Directive? Yes Yes Yes Yes Yes Yes No  Type of Advance Directive Out of facility DNR (pink MOST or yellow form) Out of facility DNR (pink MOST or yellow form) Out of facility DNR (pink MOST or yellow form) Out of facility DNR (pink MOST or yellow form) Out of facility DNR (pink MOST or yellow form) Out of facility DNR (pink MOST or yellow form) -  Does patient want to make changes to medical advance directive? No - Patient declined No - Patient declined No - Patient declined No - Patient declined No - Patient declined No - Patient declined No - Patient declined  Copy of [place] in [place]  Would patient like information on creating a medical advance directive? - - - - - No - Patient declined No - Patient declined  Pre-existing out of facility DNR order (yellow form or pink MOST form) Pink MOST form placed in chart (order not valid for inpatient use);Yellow form placed in chart (order not valid for inpatient use) Pink MOST form placed in chart (order not valid for inpatient use);Yellow form placed in chart (order not valid for inpatient use) Pink MOST form placed in chart (order not valid for inpatient use);Yellow form placed in chart (order not valid for inpatient use) Pink MOST form placed in chart (order not valid for inpatient use);Yellow form placed in chart (order not valid for inpatient use) Pink MOST form placed in chart (order not valid  for inpatient use);Yellow form placed in chart (order not valid for inpatient use) Pink MOST form placed in chart (order not valid for inpatient use);Yellow form placed in chart (order not valid for inpatient use) -    Tobacco Social History   Tobacco Use  Smoking Status Former Smoker  . Packs/day: 1.00  . Years: 20.00  . Pack years: 20.00  Smokeless Tobacco Never Used     Counseling given: Not Answered   Clinical Intake:  Pre-visit preparation completed: No  Pain : 0-10 Pain Score: 6  Pain Type: Acute pain Pain Location: Abdomen Pain Descriptors / Indicators: Aching Pain Onset: Today Pain Frequency: Intermittent     Nutritional Risks: None Diabetes: No  How often do you need to have someone help you when you read instructions, pamphlets, or other written materials from your doctor or pharmacy?: 2 - Rarely What is the last grade level you completed in school?: college  Interpreter Needed?: No  Information entered by :: Tyson Dense, RN  Past Medical History:  Diagnosis Date  . Abnormality of gait   . Anxiety state, unspecified   . Candidiasis of other urogenital sites   . Chronic airway obstruction, not elsewhere classified   . Chronic obstructive lung disease ([place])   . Depressive disorder, not elsewhere classified   . Esophageal reflux   . Hyperlipemia   . Lumbago   .  Osteoporosis   . Spinal stenosis   . 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 Unspecified dementia without behavioral disturbance    Past Surgical History:  Procedure Laterality Date  . ABDOMINAL HYSTERECTOMY    . APPENDECTOMY    . CHOLECYSTECTOMY    . VERTEBROPLASTY     Family History  Family history unknown: Yes   Social History   Socioeconomic History  . Marital status: Divorced    Spouse name: Not on file  . Number of children: Not on file  . Years of education: Not on file  . Highest education level: Not on file  Occupational History  . Not on file   Social Needs  . Financial resource strain: Not hard at all  . Food insecurity:    Worry: Never true    Inability: Never true  . Transportation needs:    Medical: No    Non-medical: No  Tobacco Use  . Smoking status: Former Smoker    Packs/day: 1.00    Years: 20.00    Pack years: 20.00  . Smokeless tobacco: Never Used  Substance and Sexual Activity  . Alcohol use: No  . Drug use: No  . Sexual activity: Never  Lifestyle  . Physical activity:    Days per week: 0 days    Minutes per session: 0 min  . Stress: Not at all  Relationships  . Social connections:    Talks on phone: Never    Gets together: Three times a week    Attends religious service: Never    Active member of club or organization: No    Attends meetings of clubs or organizations: Never    Relationship status: Divorced  Other Topics Concern  . Not on file  Social History Narrative  . Not on file    Outpatient Encounter Medications as of 03/21/2018  Medication Sig  . acetaminophen (TYLENOL) 325 MG tablet Take 325 mg by mouth every 4 (four) hours as needed (pain).   Marland Kitchen aspirin EC 81 MG tablet Take 1 tablet (81 mg total) by mouth daily.  Marland Kitchen atorvastatin (LIPITOR) 10 MG tablet Take 10 mg by mouth daily.  . Cholecalciferol 1000 units tablet Take 2,000 Units by mouth at bedtime.   . DULoxetine (CYMBALTA) 60 MG capsule Take 60 mg by mouth daily. For depression  . fluticasone (FLONASE) 50 MCG/ACT nasal spray Place 2 sprays into both nostrils at bedtime.  . gabapentin (NEURONTIN) 100 MG capsule Take 1 capsule (100 mg total) by mouth 3 (three) times daily.  . Guaifenesin (MUCINEX MAXIMUM STRENGTH) 1200 MG TB12 Take 1 tablet by mouth 2 (two) times daily.   . Hypromellose 0.4 % SOLN Instill 2 drops in both eyes two times a day for dry eyes.  Instill one hour prior to Tobramycin drops  . Insulin Detemir (LEVEMIR FLEXTOUCH) 100 UNIT/ML Pen Inject 30 Units into the skin at bedtime.   Marland Kitchen linagliptin (TRADJENTA) 5 MG TABS tablet  Take 5 mg by mouth daily.  . Melatonin 3 MG TABS Take 1 tablet by mouth at bedtime.  . Memantine HCl-Donepezil HCl (NAMZARIC) 28-10 MG CP24 Take 1 capsule by mouth every evening. For dementia  . metFORMIN (GLUCOPHAGE) 500 MG tablet Take 500 mg by mouth daily with breakfast.  . ranitidine (ZANTAC) 150 MG tablet Take 150 mg by mouth daily. In the morning and at bedtime For reflux  . traZODone (DESYREL) 50 MG tablet Take 50 mg by mouth at bedtime.  Marland Kitchen  umeclidinium bromide (INCRUSE ELLIPTA) 62.5 MCG/INH AEPB Inhale 1 puff into the lungs daily.    No facility-administered encounter medications on file as of 03/21/2018.     Activities of Daily Living In your present state of health, do you have any difficulty performing the following activities: 03/21/2018  Hearing? N  Vision? Y  Difficulty concentrating or making decisions? Y  Walking or climbing stairs? Y  Dressing or bathing? Y  Doing errands, shopping? Y  Preparing Food and eating ? Y  Using the Toilet? Y  In the past six months, have you accidently leaked urine? Y  Do you have problems with loss of bowel control? Y  Managing your Medications? Y  Managing your Finances? Y  Housekeeping or managing your Housekeeping? Y  Some recent data might be hidden    Patient Care Team: Gildardo Cranker, DO as PCP - General (Internal Medicine) Nyoka Cowden Phylis Bougie, NP as Nurse Practitioner ([place], [place] ([place])    Assessment:   This is a routine wellness examination for [place].  Exercise Activities and Dietary recommendations Current Exercise Habits: The patient does not participate in regular exercise at present, Exercise limited by: neurologic condition(s);orthopedic condition(s)  Goals   None     Fall Risk Fall Risk  03/21/2018 03/08/2017 12/06/2014  Falls in the past year? No No No   Is the patient's home free of loose throw rugs in walkways, pet beds, electrical cords, etc?   yes       Grab bars in the bathroom? yes      Handrails on the stairs?   yes      Adequate lighting?   yes  Depression Screen PHQ 2/9 Scores 03/21/2018 03/08/2017 12/06/2014  PHQ - 2 Score 0 0 0     Cognitive Function     6CIT Screen 03/21/2018 03/08/2017  What Year? 4 points 4 points  What month? 3 points 3 points  What time? 3 points 0 points  Count back from 20 0 points 0 points  Months in reverse 4 points 0 points  Repeat phrase 10 points 10 points  Total Score 24 17    Immunization History  Administered Date(s) Administered  . Influenza Whole 05/22/2013  . Influenza-Unspecified 05/23/2014, 05/20/2015, 06/27/2016, 06/09/2017  . PPD Test 05/01/2016, 05/08/2016  . Pneumococcal-Unspecified 08/31/2011    Qualifies for Shingles Vaccine? Not in past records  Screening Tests Health Maintenance  Topic Date Due  . INFLUENZA VACCINE  03/10/2018  . HEMOGLOBIN A1C  05/05/2018  . URINE MICROALBUMIN  06/10/2018  . OPHTHALMOLOGY EXAM  11/16/2018  . FOOT EXAM  03/18/2019  . TETANUS/TDAP  12/05/2024  . DEXA SCAN  Completed  . PNA vac Low Risk Adult  Completed    Cancer Screenings: Lung: Low Dose CT Chest recommended if Age 25-80 years, 30 pack-year currently smoking OR have quit w/in 15years. Patient does not qualify. Breast:  Up to date on Mammogram? Yes   Up to date of Bone Density/Dexa? Yes Colorectal: up to date  Additional Screenings:  Hepatitis C Screening: declined     Plan:  I have personally reviewed and addressed the Medicare Annual Wellness questionnaire and have noted the following in the patient's chart:  A. Medical and social history B. Use of alcohol, tobacco or illicit drugs  C. Current medications and supplements D. Functional ability and status E.  Nutritional status F.  Physical activity G. Advance directives H. List of other physicians I.  Hospitalizations, surgeries, and ER  visits in previous 12 months J.  [place] to include hearing, vision,  cognitive, depression L. Referrals and appointments - none  In addition, I have reviewed and discussed with patient certain preventive protocols, quality metrics, and best practice recommendations. A written personalized care plan for preventive services as well as general preventive health recommendations were provided to patient.  See attached scanned questionnaire for additional information.   Signed,   Tyson Dense, RN Nurse Health Advisor  Patient Concerns: None

## 2018-03-21 NOTE — Patient Instructions (Signed)
[name] [name] , Thank you for taking time to come for your Medicare Wellness Visit. I appreciate your ongoing commitment to your health goals. Please review the following plan we discussed and let me know if I can assist you in the future.   Screening recommendations/referrals: Colonoscopy excluded, over age 76 Mammogram excluded, over age 55 Bone Density up to date Recommended yearly ophthalmology/optometry visit for glaucoma screening and checkup Recommended yearly dental visit for hygiene and checkup  Vaccinations: Influenza vaccine due 2019  Pneumococcal vaccine up to date, completed Tdap vaccine up to date, due 12/05/2024 Shingles vaccine not in past records    Advanced directives: in chart  Conditions/risks identified: none  Next appointment: Dr. Eulas Post makes rounds   Preventive Care 78 Years and Older, Female Preventive care refers to lifestyle choices and visits with your health care provider that can promote health and wellness. What does preventive care include?  A yearly physical exam. This is also called an annual well check.  Dental exams once or twice a year.  Routine eye exams. Ask your health care provider how often you should have your eyes checked.  Personal lifestyle choices, including:  Daily care of your teeth and gums.  Regular physical activity.  Eating a healthy diet.  Avoiding tobacco and drug use.  Limiting alcohol use.  Practicing safe sex.  Taking low-dose aspirin every day.  Taking vitamin and mineral supplements as recommended by your health care provider. What happens during an annual well check? The services and screenings done by your health care provider during your annual well check will depend on your age, overall health, lifestyle risk factors, and family history of disease. Counseling  Your health care provider may ask you questions about your:  Alcohol use.  Tobacco use.  Drug use.  Emotional well-being.  Home and  relationship well-being.  Sexual activity.  Eating habits.  History of falls.  Memory and ability to understand (cognition).  Work and work Statistician.  Reproductive health. Screening  You may have the following tests or measurements:  Height, weight, and BMI.  Blood pressure.  Lipid and cholesterol levels. These may be checked every 5 years, or more frequently if you are over 20 years old.  Skin check.  Lung cancer screening. You may have this screening every year starting at age 53 if you have a 30-pack-year history of smoking and currently smoke or have quit within the past 15 years.  Fecal occult blood test (FOBT) of the stool. You may have this test every year starting at age 29.  Flexible sigmoidoscopy or colonoscopy. You may have a sigmoidoscopy every 5 years or a colonoscopy every 10 years starting at age 23.  Hepatitis C blood test.  Hepatitis B blood test.  Sexually transmitted disease (STD) testing.  Diabetes screening. This is done by checking your blood sugar (glucose) after you have not eaten for a while (fasting). You may have this done every 1-3 years.  Bone density scan. This is done to screen for osteoporosis. You may have this done starting at age 69.  Mammogram. This may be done every 1-2 years. Talk to your health care provider about how often you should have regular mammograms. Talk with your health care provider about your test results, treatment options, and if necessary, the need for more tests. Vaccines  Your health care provider may recommend certain vaccines, such as:  Influenza vaccine. This is recommended every year.  Tetanus, diphtheria, and acellular pertussis (Tdap, Td) vaccine. You may  need a Td booster every 10 years.  Zoster vaccine. You may need this after age 93.  Pneumococcal 13-valent conjugate (PCV13) vaccine. One dose is recommended after age 18.  Pneumococcal polysaccharide (PPSV23) vaccine. One dose is recommended after  age 59. Talk to your health care provider about which screenings and vaccines you need and how often you need them. This information is not intended to replace advice given to you by your health care provider. Make sure you discuss any questions you have with your health care provider. Document Released: 08/23/2015 Document Revised: 04/15/2016 Document Reviewed: 05/28/2015 Elsevier Interactive Patient Education  2017 [place] Prevention in the Home Falls can cause injuries. They can happen to people of all ages. There are many things you can do to make your home safe and to help prevent falls. What can I do on the outside of my home?  Regularly fix the edges of walkways and driveways and fix any cracks.  Remove anything that might make you trip as you walk through a door, such as a raised step or threshold.  Trim any bushes or trees on the path to your home.  Use bright outdoor lighting.  Clear any walking paths of anything that might make someone trip, such as rocks or tools.  Regularly check to see if handrails are loose or broken. Make sure that both sides of any steps have handrails.  Any raised decks and porches should have guardrails on the edges.  Have any leaves, snow, or ice cleared regularly.  Use sand or salt on walking paths during winter.  Clean up any spills in your garage right away. This includes oil or grease spills. What can I do in the bathroom?  Use night lights.  Install grab bars by the toilet and in the tub and shower. Do not use towel bars as grab bars.  Use non-skid mats or decals in the tub or shower.  If you need to sit down in the shower, use a plastic, non-slip stool.  Keep the floor dry. Clean up any water that spills on the floor as soon as it happens.  Remove soap buildup in the tub or shower regularly.  Attach bath mats securely with double-sided non-slip rug tape.  Do not have throw rugs and other things on the floor that can  make you trip. What can I do in the bedroom?  Use night lights.  Make sure that you have a light by your bed that is easy to reach.  Do not use any sheets or blankets that are too big for your bed. They should not hang down onto the floor.  Have a firm chair that has side arms. You can use this for support while you get dressed.  Do not have throw rugs and other things on the floor that can make you trip. What can I do in the kitchen?  Clean up any spills right away.  Avoid walking on wet floors.  Keep items that you use a lot in easy-to-reach places.  If you need to reach something above you, use a strong step stool that has a grab bar.  Keep electrical cords out of the way.  Do not use floor polish or wax that makes floors slippery. If you must use wax, use non-skid floor wax.  Do not have throw rugs and other things on the floor that can make you trip. What can I do with my stairs?  Do not leave any items on  the stairs.  Make sure that there are handrails on both sides of the stairs and use them. Fix handrails that are broken or loose. Make sure that handrails are as long as the stairways.  Check any carpeting to make sure that it is firmly attached to the stairs. Fix any carpet that is loose or worn.  Avoid having throw rugs at the top or bottom of the stairs. If you do have throw rugs, attach them to the floor with carpet tape.  Make sure that you have a light switch at the top of the stairs and the bottom of the stairs. If you do not have them, ask someone to add them for you. What else can I do to help prevent falls?  Wear shoes that:  Do not have high heels.  Have rubber bottoms.  Are comfortable and fit you well.  Are closed at the toe. Do not wear sandals.  If you use a stepladder:  Make sure that it is fully opened. Do not climb a closed stepladder.  Make sure that both sides of the stepladder are locked into place.  Ask someone to hold it for you,  if possible.  Clearly mark and make sure that you can see:  Any grab bars or handrails.  First and last steps.  Where the edge of each step is.  Use tools that help you move around (mobility aids) if they are needed. These include:  Canes.  Walkers.  Scooters.  Crutches.  Turn on the lights when you go into a dark area. Replace any light bulbs as soon as they burn out.  Set up your furniture so you have a clear path. Avoid moving your furniture around.  If any of your floors are uneven, fix them.  If there are any pets around you, be aware of where they are.  Review your medicines with your doctor. Some medicines can make you feel dizzy. This can increase your chance of falling. Ask your doctor what other things that you can do to help prevent falls. This information is not intended to replace advice given to you by your health care provider. Make sure you discuss any questions you have with your health care provider. Document Released: 05/23/2009 Document Revised: 01/02/2016 Document Reviewed: 08/31/2014 Elsevier Interactive Patient Education  2017 [employer].

## 2018-03-22 DIAGNOSIS — M6281 Muscle weakness (generalized): Secondary | ICD-10-CM | POA: Diagnosis not present

## 2018-03-22 DIAGNOSIS — F0390 Unspecified dementia without behavioral disturbance: Secondary | ICD-10-CM | POA: Diagnosis not present

## 2018-03-23 DIAGNOSIS — M6281 Muscle weakness (generalized): Secondary | ICD-10-CM | POA: Diagnosis not present

## 2018-03-23 DIAGNOSIS — F0390 Unspecified dementia without behavioral disturbance: Secondary | ICD-10-CM | POA: Diagnosis not present

## 2018-03-24 DIAGNOSIS — F0390 Unspecified dementia without behavioral disturbance: Secondary | ICD-10-CM | POA: Diagnosis not present

## 2018-03-24 DIAGNOSIS — M6281 Muscle weakness (generalized): Secondary | ICD-10-CM | POA: Diagnosis not present

## 2018-03-25 DIAGNOSIS — M6281 Muscle weakness (generalized): Secondary | ICD-10-CM | POA: Diagnosis not present

## 2018-03-25 DIAGNOSIS — F0390 Unspecified dementia without behavioral disturbance: Secondary | ICD-10-CM | POA: Diagnosis not present

## 2018-03-26 DIAGNOSIS — F0390 Unspecified dementia without behavioral disturbance: Secondary | ICD-10-CM | POA: Diagnosis not present

## 2018-03-26 DIAGNOSIS — M6281 Muscle weakness (generalized): Secondary | ICD-10-CM | POA: Diagnosis not present

## 2018-03-29 DIAGNOSIS — M6281 Muscle weakness (generalized): Secondary | ICD-10-CM | POA: Diagnosis not present

## 2018-03-29 DIAGNOSIS — F0390 Unspecified dementia without behavioral disturbance: Secondary | ICD-10-CM | POA: Diagnosis not present

## 2018-03-30 ENCOUNTER — Encounter: Payer: Self-pay | Admitting: Internal Medicine

## 2018-03-30 DIAGNOSIS — M6281 Muscle weakness (generalized): Secondary | ICD-10-CM | POA: Diagnosis not present

## 2018-03-30 DIAGNOSIS — F0390 Unspecified dementia without behavioral disturbance: Secondary | ICD-10-CM | POA: Diagnosis not present

## 2018-03-31 DIAGNOSIS — M6281 Muscle weakness (generalized): Secondary | ICD-10-CM | POA: Diagnosis not present

## 2018-03-31 DIAGNOSIS — F0390 Unspecified dementia without behavioral disturbance: Secondary | ICD-10-CM | POA: Diagnosis not present

## 2018-04-03 DIAGNOSIS — M6281 Muscle weakness (generalized): Secondary | ICD-10-CM | POA: Diagnosis not present

## 2018-04-03 DIAGNOSIS — F0390 Unspecified dementia without behavioral disturbance: Secondary | ICD-10-CM | POA: Diagnosis not present

## 2018-04-04 DIAGNOSIS — M6281 Muscle weakness (generalized): Secondary | ICD-10-CM | POA: Diagnosis not present

## 2018-04-04 DIAGNOSIS — F0390 Unspecified dementia without behavioral disturbance: Secondary | ICD-10-CM | POA: Diagnosis not present

## 2018-04-05 DIAGNOSIS — M6281 Muscle weakness (generalized): Secondary | ICD-10-CM | POA: Diagnosis not present

## 2018-04-05 DIAGNOSIS — F0390 Unspecified dementia without behavioral disturbance: Secondary | ICD-10-CM | POA: Diagnosis not present

## 2018-04-06 DIAGNOSIS — F0390 Unspecified dementia without behavioral disturbance: Secondary | ICD-10-CM | POA: Diagnosis not present

## 2018-04-06 DIAGNOSIS — M6281 Muscle weakness (generalized): Secondary | ICD-10-CM | POA: Diagnosis not present

## 2018-04-07 DIAGNOSIS — M6281 Muscle weakness (generalized): Secondary | ICD-10-CM | POA: Diagnosis not present

## 2018-04-07 DIAGNOSIS — F0390 Unspecified dementia without behavioral disturbance: Secondary | ICD-10-CM | POA: Diagnosis not present

## 2018-04-08 ENCOUNTER — Encounter: Payer: Self-pay | Admitting: Adult Health

## 2018-04-08 ENCOUNTER — Non-Acute Institutional Stay (SKILLED_NURSING_FACILITY): Payer: Medicare Other | Admitting: Adult Health

## 2018-04-08 DIAGNOSIS — E785 Hyperlipidemia, unspecified: Secondary | ICD-10-CM

## 2018-04-08 DIAGNOSIS — F0150 Vascular dementia without behavioral disturbance: Secondary | ICD-10-CM

## 2018-04-08 DIAGNOSIS — M545 Low back pain, unspecified: Secondary | ICD-10-CM

## 2018-04-08 DIAGNOSIS — E1169 Type 2 diabetes mellitus with other specified complication: Secondary | ICD-10-CM

## 2018-04-08 DIAGNOSIS — K219 Gastro-esophageal reflux disease without esophagitis: Secondary | ICD-10-CM

## 2018-04-08 DIAGNOSIS — G8929 Other chronic pain: Secondary | ICD-10-CM

## 2018-04-08 DIAGNOSIS — F0152 Vascular dementia, unspecified severity, with psychotic disturbance: Secondary | ICD-10-CM

## 2018-04-08 NOTE — Progress Notes (Signed)
Location:   [facility] Room Number: [place] of Service:  SNF (31)   CODE STATUS: DNR (MOST up dated 10-01-17)  Allergies  Allergen Reactions  . Avelox [Moxifloxacin Hcl In Nacl] Hives  . Codeine     Reports her throat swelled in the past but has tolerated since.  . Compazine [Prochlorperazine Edisylate] Nausea And Vomiting  . Morphine And Related     Patient doesn't recall  . Mycobacterium   . Penicillins     Patient doesn't recall.  . Phenothiazines     Patient doesn't recall  . Prednisone     Insomnia, confusion.  Marland Kitchen Reglan [Metoclopramide]     Patient doesn't recall "but it was awful"  . Sulfonamide Derivatives Hives    Chief Complaint  Patient presents with  . Medical Management of Chronic Issues    Hyperlipidemia; back pain; dementia; gerd.     HPI:  She is a 77 year old long term resident of this facility being seen for the management of her chronic illnesses; hyperlipidemia; back pain; dementia; gerd. She denies any heart burn; no change in appetite; no back pain. There are no nursing concerns at this time.   Past Medical History:  Diagnosis Date  . Abnormality of gait   . Anxiety state, unspecified   . Candidiasis of other urogenital sites   . Chronic airway obstruction, not elsewhere classified   . Chronic obstructive lung disease ([place])   . Depressive disorder, not elsewhere classified   . Esophageal reflux   . Hyperlipemia   . Lumbago   . Osteoporosis   . Spinal stenosis   . 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 Unspecified dementia without behavioral disturbance     Past Surgical History:  Procedure Laterality Date  . ABDOMINAL HYSTERECTOMY    . APPENDECTOMY    . CHOLECYSTECTOMY    . VERTEBROPLASTY      Social History   Socioeconomic History  . Marital status: Divorced    Spouse name: Not on file  . Number of children: Not on file  . Years of education: Not on file  . Highest education  level: Not on file  Occupational History  . Not on file  Social Needs  . Financial resource strain: Not hard at all  . Food insecurity:    Worry: Never true    Inability: Never true  . Transportation needs:    Medical: No    Non-medical: No  Tobacco Use  . Smoking status: Former Smoker    Packs/day: 1.00    Years: 20.00    Pack years: 20.00  . Smokeless tobacco: Never Used  Substance and Sexual Activity  . Alcohol use: No  . Drug use: No  . Sexual activity: Never  Lifestyle  . Physical activity:    Days per week: 0 days    Minutes per session: 0 min  . Stress: Not at all  Relationships  . Social connections:    Talks on phone: Never    Gets together: Three times a week    Attends religious service: Never    Active member of club or organization: No    Attends meetings of clubs or organizations: Never    Relationship status: Divorced  . Intimate partner violence:    Fear of current or ex partner: No    Emotionally abused: No    Physically abused: No    Forced sexual activity: No  Other Topics  Concern  . Not on file  Social History Narrative  . Not on file   Family History  Family history unknown: Yes      VITAL SIGNS BP (!) 90/52   Pulse 77   Temp (!) 97.5 F (36.4 C)   Resp 18   Ht 5\' 2"  (1.575 m)   Wt 185 lb 9.6 oz (84.2 kg)   SpO2 97%   BMI 33.95 kg/m   Outpatient Encounter Medications as of 04/08/2018  Medication Sig  . acetaminophen (TYLENOL) 325 MG tablet Take 325 mg by mouth every 4 (four) hours as needed (pain).   Marland Kitchen aspirin EC 81 MG tablet Take 1 tablet (81 mg total) by mouth daily.  Marland Kitchen atorvastatin (LIPITOR) 10 MG tablet Take 10 mg by mouth daily.  . Cholecalciferol 1000 units tablet Take 2,000 Units by mouth at bedtime.   . DULoxetine (CYMBALTA) 60 MG capsule Take 60 mg by mouth daily. For depression  . fluticasone (FLONASE) 50 MCG/ACT nasal spray Place 2 sprays into both nostrils at bedtime.  . gabapentin (NEURONTIN) 100 MG capsule Take 1  capsule (100 mg total) by mouth 3 (three) times daily.  . Guaifenesin (MUCINEX MAXIMUM STRENGTH) 1200 MG TB12 Take 1 tablet by mouth 2 (two) times daily.   . Hypromellose 0.4 % SOLN Instill 2 drops in both eyes two times a day for dry eyes.  Instill one hour prior to Tobramycin drops  . Insulin Detemir (LEVEMIR FLEXTOUCH) 100 UNIT/ML Pen Inject 30 Units into the skin at bedtime.   Marland Kitchen linagliptin (TRADJENTA) 5 MG TABS tablet Take 5 mg by mouth daily.  . Melatonin 3 MG TABS Take 1 tablet by mouth at bedtime.  . Memantine HCl-Donepezil HCl (NAMZARIC) 28-10 MG CP24 Take 1 capsule by mouth every evening. For dementia  . metFORMIN (GLUCOPHAGE) 500 MG tablet Take 500 mg by mouth daily with breakfast.  . Nutritional Supplements (NUTRITIONAL SUPPLEMENT PO) Low concentrated sweets diet - Regular texture, NAS  . ranitidine (ZANTAC) 150 MG tablet Take 150 mg by mouth daily. In the morning and at bedtime For reflux  . traZODone (DESYREL) 50 MG tablet Take 50 mg by mouth at bedtime.  Marland Kitchen umeclidinium bromide (INCRUSE ELLIPTA) 62.5 MCG/INH AEPB Inhale 1 puff into the lungs daily.    No facility-administered encounter medications on file as of 04/08/2018.      SIGNIFICANT DIAGNOSTIC EXAMS  NO NEW EXAMS    LABS REVIEWED: PREVIOUS    03-20-17: tsh 3.10 03-22-17: vit B 12: 357; folate 9.8 06-10-17: wbc 11.4; hgb 13.1; hct 41.9; mcv 91.5; plt 288; glucose 109; bun 10.0; creat 0.56; k+ 4.1; na++ 144; ca 9.0; liver normal albumin 3.8; hgb a1c 7.2; chol 153; ldl 79; trig 176; hdl 39; urine micro-albumin 3.2 11-02-17: hgb a1c 6.9 01-18-18:  wbc 10.6; hgb 11.8; hct 36; plt 315; glucose 168; bun 16; creat 0.5; k+ 4.3; na++ 144; liver choll 145; ldl 75 trig 120; hdl 46; tsh 2.20  TODAY:   02-11-18: chol 171; ldl 94; trig 146; hdl 48; vit D 31.52    Review of Systems  Constitutional: Negative for malaise/fatigue.  Respiratory: Negative for cough and shortness of breath.   Cardiovascular: Negative for chest pain,  palpitations and leg swelling.  Gastrointestinal: Negative for abdominal pain, constipation and heartburn.  Musculoskeletal: Negative for back pain, joint pain and myalgias.  Skin: Negative.   Neurological: Negative for dizziness.  Psychiatric/Behavioral: The patient is not nervous/anxious.     Physical Exam  Constitutional:  She is oriented to person, place, and time. She appears well-developed and well-nourished. No distress.  Neck: No thyromegaly present.  Cardiovascular: Normal rate, regular rhythm and intact distal pulses.  Murmur heard. 1/6  Pulmonary/Chest: Effort normal and breath sounds normal. No respiratory distress.  Abdominal: Soft. Bowel sounds are normal. She exhibits no distension. There is no tenderness.  Musculoskeletal: Normal range of motion. She exhibits no edema.  Lymphadenopathy:    She has no cervical adenopathy.  Neurological: She is alert and oriented to person, place, and time.  Skin: Skin is warm and dry. She is not diaphoretic.  Psychiatric: She has a normal mood and affect.     ASSESSMENT/ PLAN:  TODAY:   1. Hyperlipidemia associated with type 2 diabetes mellitus: stable ldl is 94; trig 146 will continue lipitor 10 mg daily will monitor her status.    2. Vascular dementia with paranioa: without change in her status; will continue namzaric 28-10 mg daily and will monitor her weight is 182 pounds.   3. 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 Chronic low back pain without sciatica: she is presently stable; will continue  neurontin 100 mg three times daily; cymbalta 60 mg daily  will monitor her status.   PREVIOUS   5. Major depressive disorder, recurrent episode: she is stable and is benefiting from cymbalta 60 mg daily (also takes for pain management) will not make changes will continue trazodone 50 mg nightly for sleep.   6. Type  2 diabetes mellitus with complication with long term current use of insulin: her  hgb a1c is 6.9 (previous 7.2). Stable Will continue  tradjenta 5  mg daily levemir 00 units nightly and will continue metformin 500 mg daily Is on asa ; could not tolerate low does ace   7.  Hypertension associated with diabetes: b/p 90/52 stable   will continue asa 81 mg daily    Could not tolerate ACE at low dose will monitor  8. COPD: stable  will continue;mucinex 1200 mg  twice daily ; Incruse ellipta 1  puff daily  flonase 2 puffs nightlyfor allergic rhinitis    MD is aware of resident's narcotic use and is in agreement with current plan of care. We will attempt to wean resident as apropriate   Ok Edwards NP [facility]  Contact [phone] Monday through Friday 8am- 5pm  After hours call [phone]

## 2018-04-10 DIAGNOSIS — M6281 Muscle weakness (generalized): Secondary | ICD-10-CM | POA: Diagnosis not present

## 2018-04-10 DIAGNOSIS — F0390 Unspecified dementia without behavioral disturbance: Secondary | ICD-10-CM | POA: Diagnosis not present

## 2018-04-11 DIAGNOSIS — F0390 Unspecified dementia without behavioral disturbance: Secondary | ICD-10-CM | POA: Diagnosis not present

## 2018-04-11 DIAGNOSIS — M6281 Muscle weakness (generalized): Secondary | ICD-10-CM | POA: Diagnosis not present

## 2018-04-12 DIAGNOSIS — M6281 Muscle weakness (generalized): Secondary | ICD-10-CM | POA: Diagnosis not present

## 2018-04-12 DIAGNOSIS — F0390 Unspecified dementia without behavioral disturbance: Secondary | ICD-10-CM | POA: Diagnosis not present

## 2018-04-14 DIAGNOSIS — F331 Major depressive disorder, recurrent, moderate: Secondary | ICD-10-CM | POA: Diagnosis not present

## 2018-04-14 DIAGNOSIS — F0390 Unspecified dementia without behavioral disturbance: Secondary | ICD-10-CM | POA: Diagnosis not present

## 2018-04-14 DIAGNOSIS — F5105 Insomnia due to other mental disorder: Secondary | ICD-10-CM | POA: Diagnosis not present

## 2018-04-28 DIAGNOSIS — F331 Major depressive disorder, recurrent, moderate: Secondary | ICD-10-CM | POA: Diagnosis not present

## 2018-04-28 DIAGNOSIS — F5105 Insomnia due to other mental disorder: Secondary | ICD-10-CM | POA: Diagnosis not present

## 2018-04-28 DIAGNOSIS — F0390 Unspecified dementia without behavioral disturbance: Secondary | ICD-10-CM | POA: Diagnosis not present

## 2018-05-06 ENCOUNTER — Encounter: Payer: Self-pay | Admitting: Adult Health

## 2018-05-06 ENCOUNTER — Non-Acute Institutional Stay (SKILLED_NURSING_FACILITY): Payer: Medicare Other | Admitting: Adult Health

## 2018-05-06 DIAGNOSIS — E118 Type 2 diabetes mellitus with unspecified complications: Secondary | ICD-10-CM | POA: Diagnosis not present

## 2018-05-06 DIAGNOSIS — F339 Major depressive disorder, recurrent, unspecified: Secondary | ICD-10-CM | POA: Diagnosis not present

## 2018-05-06 DIAGNOSIS — I10 Essential (primary) hypertension: Secondary | ICD-10-CM

## 2018-05-06 DIAGNOSIS — J449 Chronic obstructive pulmonary disease, unspecified: Secondary | ICD-10-CM

## 2018-05-06 DIAGNOSIS — Z794 Long term (current) use of insulin: Secondary | ICD-10-CM | POA: Diagnosis not present

## 2018-05-06 DIAGNOSIS — I152 Hypertension secondary to endocrine disorders: Secondary | ICD-10-CM

## 2018-05-06 DIAGNOSIS — E1159 Type 2 diabetes mellitus with other circulatory complications: Secondary | ICD-10-CM

## 2018-05-06 NOTE — Progress Notes (Signed)
Location:   [facility] Room Number: [place] of Service:  SNF (31)   CODE STATUS: DNR (MOST up dated 10-01-17)  Allergies  Allergen Reactions  . Avelox [Moxifloxacin Hcl In Nacl] Hives  . Codeine     Reports her throat swelled in the past but has tolerated since.  . Compazine [Prochlorperazine Edisylate] Nausea And Vomiting  . Morphine And Related     Patient doesn't recall  . Mycobacterium   . Penicillins     Patient doesn't recall.  . Phenothiazines     Patient doesn't recall  . Prednisone     Insomnia, confusion.  Marland Kitchen Reglan [Metoclopramide]     Patient doesn't recall "but it was awful"  . Sulfonamide Derivatives Hives    Chief Complaint  Patient presents with  . Medical Management of Chronic Issues    Hypertension; diabetes; copd; depression     HPI:  She is a 77 year old long term resident of this facility being seen for the management of her chronic illnesses: hypertension; diabetes; copd; depression. She denies any depressive feelings or anxiety; she denies any cough of shortness of breath.   Past Medical History:  Diagnosis Date  . Abnormality of gait   . Anxiety state, unspecified   . Candidiasis of other urogenital sites   . Chronic airway obstruction, not elsewhere classified   . Chronic obstructive lung disease ([place])   . Depressive disorder, not elsewhere classified   . Esophageal reflux   . Hyperlipemia   . Lumbago   . Osteoporosis   . Spinal stenosis   . 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 Unspecified dementia without behavioral disturbance     Past Surgical History:  Procedure Laterality Date  . ABDOMINAL HYSTERECTOMY    . APPENDECTOMY    . CHOLECYSTECTOMY    . VERTEBROPLASTY      Social History   Socioeconomic History  . Marital status: Divorced    Spouse name: Not on file  . Number of children: Not on file  . Years of education: Not on file  . Highest education level: Not on file    Occupational History  . Not on file  Social Needs  . Financial resource strain: Not hard at all  . Food insecurity:    Worry: Never true    Inability: Never true  . Transportation needs:    Medical: No    Non-medical: No  Tobacco Use  . Smoking status: Former Smoker    Packs/day: 1.00    Years: 20.00    Pack years: 20.00  . Smokeless tobacco: Never Used  Substance and Sexual Activity  . Alcohol use: No  . Drug use: No  . Sexual activity: Never  Lifestyle  . Physical activity:    Days per week: 0 days    Minutes per session: 0 min  . Stress: Not at all  Relationships  . Social connections:    Talks on phone: Never    Gets together: Three times a week    Attends religious service: Never    Active member of club or organization: No    Attends meetings of clubs or organizations: Never    Relationship status: Divorced  . Intimate partner violence:    Fear of current or ex partner: No    Emotionally abused: No    Physically abused: No    Forced sexual activity: No  Other Topics Concern  . Not on file  Social History Narrative  . Not on file   Family History  Family history unknown: Yes      VITAL SIGNS BP (!) 99/58   Pulse 72   Temp (!) 97.5 F (36.4 C)   Resp 18   Ht 5\' 2"  (1.575 m)   Wt 187 lb 4.8 oz (85 kg)   SpO2 94%   BMI 34.26 kg/m   Outpatient Encounter Medications as of 05/06/2018  Medication Sig  . acetaminophen (TYLENOL) 325 MG tablet Take 325 mg by mouth every 4 (four) hours as needed (pain).   Marland Kitchen aspirin EC 81 MG tablet Take 1 tablet (81 mg total) by mouth daily.  Marland Kitchen atorvastatin (LIPITOR) 10 MG tablet Take 10 mg by mouth daily.  . Cholecalciferol 1000 units tablet Take 2,000 Units by mouth at bedtime.   . DULoxetine (CYMBALTA) 60 MG capsule Take 60 mg by mouth daily. For depression  . fluticasone (FLONASE) 50 MCG/ACT nasal spray Place 2 sprays into both nostrils at bedtime.  . gabapentin (NEURONTIN) 100 MG capsule Take 1 capsule (100 mg  total) by mouth 3 (three) times daily.  . Guaifenesin (MUCINEX MAXIMUM STRENGTH) 1200 MG TB12 Take 1 tablet by mouth 2 (two) times daily.   . Hypromellose 0.4 % SOLN Instill 2 drops in both eyes two times a day for dry eyes.  Instill one hour prior to Tobramycin drops  . Insulin Detemir (LEVEMIR FLEXTOUCH) 100 UNIT/ML Pen Inject 30 Units into the skin at bedtime.   Marland Kitchen linagliptin (TRADJENTA) 5 MG TABS tablet Take 5 mg by mouth daily.  . Melatonin 3 MG TABS Take 2 tablets by mouth at bedtime.   . Memantine HCl-Donepezil HCl (NAMZARIC) 28-10 MG CP24 Take 1 capsule by mouth every evening. For dementia  . metFORMIN (GLUCOPHAGE) 500 MG tablet Take 500 mg by mouth daily with breakfast.  . Nutritional Supplements (NUTRITIONAL SUPPLEMENT PO) Low concentrated sweets diet - Regular texture, NAS  . ondansetron (ZOFRAN) 4 MG tablet Take 4 mg by mouth every 6 (six) hours as needed for nausea or vomiting.  . ranitidine (ZANTAC) 150 MG tablet Take 150 mg by mouth daily. In the morning and at bedtime For reflux  . traZODone (DESYREL) 50 MG tablet Take 50 mg by mouth at bedtime.  Marland Kitchen umeclidinium bromide (INCRUSE ELLIPTA) 62.5 MCG/INH AEPB Inhale 1 puff into the lungs daily.    No facility-administered encounter medications on file as of 05/06/2018.      SIGNIFICANT DIAGNOSTIC EXAMS  NO NEW EXAMS    LABS REVIEWED: PREVIOUS    06-10-17: wbc 11.4; hgb 13.1; hct 41.9; mcv 91.5; plt 288; glucose 109; bun 10.0; creat 0.56; k+ 4.1; na++ 144; ca 9.0; liver normal albumin 3.8; hgb a1c 7.2; chol 153; ldl 79; trig 176; hdl 39; urine micro-albumin 3.2 11-02-17: hgb a1c 6.9 01-18-18:  wbc 10.6; hgb 11.8; hct 36; plt 315; glucose 168; bun 16; creat 0.5; k+ 4.3; na++ 144; liver choll 145; ldl 75 trig 120; hdl 46; tsh 2.20 02-11-18: chol 171; ldl 94; trig 146; hdl 48; vit D 31.52   NO NEW LABS.    Review of Systems  Constitutional: Negative for malaise/fatigue.  Respiratory: Negative for cough and shortness of breath.    Cardiovascular: Negative for chest pain, palpitations and leg swelling.  Gastrointestinal: Negative for abdominal pain, constipation and heartburn.  Musculoskeletal: Negative for back pain, joint pain and myalgias.  Skin: Negative.   Neurological: Negative for dizziness.  Psychiatric/Behavioral: The patient is not nervous/anxious.  Physical Exam  Constitutional: She is oriented to person, place, and time. She appears well-developed and well-nourished. No distress.  Neck: No thyromegaly present.  Cardiovascular: Normal rate, regular rhythm and intact distal pulses.  Murmur heard. 1/6  Pulmonary/Chest: Effort normal and breath sounds normal. No respiratory distress.  Abdominal: Soft. Bowel sounds are normal. She exhibits no distension. There is no tenderness.  Musculoskeletal: Normal range of motion. She exhibits no edema.  Lymphadenopathy:    She has no cervical adenopathy.  Neurological: She is alert and oriented to person, place, and time.  Skin: Skin is warm and dry. She is not diaphoretic.  Psychiatric: She has a normal mood and affect.       ASSESSMENT/ PLAN:  TODAY:   1. Major depressive disorder, recurrent episode: she is stable and is benefiting from cymbalta 60 mg daily (also takes for pain management) will not make changes will continue trazodone 50 mg nightly for sleep.   2. Type  2 diabetes mellitus with complication with long term current use of insulin: her hgb a1c is 6.9 (previous 7.2). Stable Will continue  tradjenta 5  mg daily levemir 30 units nightly and will continue metformin 500 mg daily Is on asa ; could not tolerate low does ace   3.  Hypertension associated with diabetes: b/p 99/58 stable   will continue asa 81 mg daily    Could not tolerate ACE at low dose will monitor  4. COPD: stable  will continue;mucinex 1200 mg  twice daily ; Incruse ellipta 1  puff daily  flonase 2 puffs nightlyfor allergic rhinitis   PREVIOUS   5. Hyperlipidemia associated  with type 2 diabetes mellitus: stable ldl is 94; trig 146 will continue lipitor 10 mg daily will monitor her status.    6. Vascular dementia with paranioa: without change in her status; will continue namzaric 28-10 mg daily and will monitor her weight is 187 pounds.   7. 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 Chronic low back pain without sciatica: she is presently stable; will continue  neurontin 100 mg three times daily; cymbalta 60 mg daily  will monitor her status.     MD is aware of resident's narcotic use and is in agreement with current plan of care. We will attempt to wean resident as apropriate   Ok Edwards NP [facility]  Contact [phone] Monday through Friday 8am- 5pm  After hours call [phone]

## 2018-05-09 DIAGNOSIS — F331 Major depressive disorder, recurrent, moderate: Secondary | ICD-10-CM | POA: Diagnosis not present

## 2018-05-09 DIAGNOSIS — F0390 Unspecified dementia without behavioral disturbance: Secondary | ICD-10-CM | POA: Diagnosis not present

## 2018-05-12 DIAGNOSIS — F0390 Unspecified dementia without behavioral disturbance: Secondary | ICD-10-CM | POA: Diagnosis not present

## 2018-05-12 DIAGNOSIS — F5105 Insomnia due to other mental disorder: Secondary | ICD-10-CM | POA: Diagnosis not present

## 2018-05-12 DIAGNOSIS — F331 Major depressive disorder, recurrent, moderate: Secondary | ICD-10-CM | POA: Diagnosis not present

## 2018-06-09 ENCOUNTER — Non-Acute Institutional Stay (SKILLED_NURSING_FACILITY): Payer: Medicare Other | Admitting: Adult Health

## 2018-06-09 ENCOUNTER — Encounter: Payer: Self-pay | Admitting: Adult Health

## 2018-06-09 DIAGNOSIS — E785 Hyperlipidemia, unspecified: Secondary | ICD-10-CM

## 2018-06-09 DIAGNOSIS — F0152 Vascular dementia, unspecified severity, with psychotic disturbance: Secondary | ICD-10-CM

## 2018-06-09 DIAGNOSIS — B351 Tinea unguium: Secondary | ICD-10-CM | POA: Diagnosis not present

## 2018-06-09 DIAGNOSIS — E1169 Type 2 diabetes mellitus with other specified complication: Secondary | ICD-10-CM

## 2018-06-09 DIAGNOSIS — F331 Major depressive disorder, recurrent, moderate: Secondary | ICD-10-CM | POA: Diagnosis not present

## 2018-06-09 DIAGNOSIS — E1051 Type 1 diabetes mellitus with diabetic peripheral angiopathy without gangrene: Secondary | ICD-10-CM | POA: Diagnosis not present

## 2018-06-09 DIAGNOSIS — Z794 Long term (current) use of insulin: Secondary | ICD-10-CM | POA: Diagnosis not present

## 2018-06-09 DIAGNOSIS — M545 Low back pain, unspecified: Secondary | ICD-10-CM

## 2018-06-09 DIAGNOSIS — G8929 Other chronic pain: Secondary | ICD-10-CM | POA: Diagnosis not present

## 2018-06-09 DIAGNOSIS — F0150 Vascular dementia without behavioral disturbance: Secondary | ICD-10-CM | POA: Diagnosis not present

## 2018-06-09 DIAGNOSIS — K219 Gastro-esophageal reflux disease without esophagitis: Secondary | ICD-10-CM

## 2018-06-09 DIAGNOSIS — R262 Difficulty in walking, not elsewhere classified: Secondary | ICD-10-CM | POA: Diagnosis not present

## 2018-06-09 DIAGNOSIS — F0390 Unspecified dementia without behavioral disturbance: Secondary | ICD-10-CM | POA: Diagnosis not present

## 2018-06-09 DIAGNOSIS — F5105 Insomnia due to other mental disorder: Secondary | ICD-10-CM | POA: Diagnosis not present

## 2018-06-09 NOTE — Progress Notes (Signed)
Location:   [facility] Room Number: [place] of Service:  SNF (31)   CODE STATUS: DNR  Allergies  Allergen Reactions  . Avelox [Moxifloxacin Hcl In Nacl] Hives  . Codeine     Reports her throat swelled in the past but has tolerated since.  . Compazine [Prochlorperazine Edisylate] Nausea And Vomiting  . Morphine And Related     Patient doesn't recall  . Mycobacterium   . Penicillins     Patient doesn't recall.  . Phenothiazines     Patient doesn't recall  . Prednisone     Insomnia, confusion.  Marland Kitchen Reglan [Metoclopramide]     Patient doesn't recall "but it was awful"  . Sulfonamide Derivatives Hives    Chief Complaint  Patient presents with  . Medical Management of Chronic Issues    Hyperlipidemia associated with type 2 diabetes mellitus; vascular dementia with paranoia; gerd without esophagitis; chronic low back pain without sciatica unspecified back pain laterality.     HPI:  She is a 77 year old long term resident of this facility being seen for the management of her chronic illnesses: hyperlipidemia; dementia; gerd; chronic low back pain. She tells me that she is having a bad day today; she is in a bad mood. She denies any uncontrolled back pain; no changes in appetite; no anxiety.   Past Medical History:  Diagnosis Date  . Abnormality of gait   . Anxiety state, unspecified   . Candidiasis of other urogenital sites   . Chronic airway obstruction, not elsewhere classified   . Chronic obstructive lung disease ([place])   . Depressive disorder, not elsewhere classified   . Esophageal reflux   . Hyperlipemia   . Lumbago   . Osteoporosis   . Spinal stenosis   . 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 Unspecified dementia without behavioral disturbance [facility])     Past Surgical History:  Procedure Laterality Date  . ABDOMINAL HYSTERECTOMY    . APPENDECTOMY    . CHOLECYSTECTOMY    . VERTEBROPLASTY      Social History    Socioeconomic History  . Marital status: Divorced    Spouse name: Not on file  . Number of children: Not on file  . Years of education: Not on file  . Highest education level: Not on file  Occupational History  . Not on file  Social Needs  . Financial resource strain: Not hard at all  . Food insecurity:    Worry: Never true    Inability: Never true  . Transportation needs:    Medical: No    Non-medical: No  Tobacco Use  . Smoking status: Former Smoker    Packs/day: 1.00    Years: 20.00    Pack years: 20.00  . Smokeless tobacco: Never Used  Substance and Sexual Activity  . Alcohol use: No  . Drug use: No  . Sexual activity: Never  Lifestyle  . Physical activity:    Days per week: 0 days    Minutes per session: 0 min  . Stress: Not at all  Relationships  . Social connections:    Talks on phone: Never    Gets together: Three times a week    Attends religious service: Never    Active member of club or organization: No    Attends meetings of clubs or organizations: Never    Relationship status: Divorced  . Intimate partner violence:    Fear of  current or ex partner: No    Emotionally abused: No    Physically abused: No    Forced sexual activity: No  Other Topics Concern  . Not on file  Social History Narrative  . Not on file   Family History  Family history unknown: Yes      VITAL SIGNS BP 99/63   Pulse 85   Temp (!) 97.5 F (36.4 C)   Resp 18   Ht 5\' 2"  (1.575 m)   Wt 190 lb 9.6 oz (86.5 kg)   SpO2 94%   BMI 34.86 kg/m   Outpatient Encounter Medications as of 06/09/2018  Medication Sig  . acetaminophen (TYLENOL) 325 MG tablet Take 325 mg by mouth every 4 (four) hours as needed (pain).   Marland Kitchen aspirin EC 81 MG tablet Take 1 tablet (81 mg total) by mouth daily.  Marland Kitchen atorvastatin (LIPITOR) 10 MG tablet Take 10 mg by mouth daily.  . Cholecalciferol 1000 units tablet Take 2,000 Units by mouth at bedtime.   . DULoxetine (CYMBALTA) 60 MG capsule Take 60 mg  by mouth daily. For depression  . fluticasone (FLONASE) 50 MCG/ACT nasal spray Place 2 sprays into both nostrils at bedtime.  . gabapentin (NEURONTIN) 100 MG capsule Take 1 capsule (100 mg total) by mouth 3 (three) times daily.  . Guaifenesin (MUCINEX MAXIMUM STRENGTH) 1200 MG TB12 Take 1 tablet by mouth 2 (two) times daily.   . Hypromellose 0.4 % SOLN Instill 2 drops in both eyes two times a day for dry eyes.  Instill one hour prior to Tobramycin drops  . Insulin Detemir (LEVEMIR FLEXTOUCH) 100 UNIT/ML Pen Inject 30 Units into the skin at bedtime.   Marland Kitchen linagliptin (TRADJENTA) 5 MG TABS tablet Take 5 mg by mouth daily.  . Melatonin 3 MG TABS Take 2 tablets by mouth at bedtime.   . Memantine HCl-Donepezil HCl (NAMZARIC) 28-10 MG CP24 Take 1 capsule by mouth every evening. For dementia  . metFORMIN (GLUCOPHAGE) 500 MG tablet Take 500 mg by mouth daily with breakfast.  . Nutritional Supplements (NUTRITIONAL SUPPLEMENT PO) Low concentrated sweets diet - Regular texture, NAS  . ondansetron (ZOFRAN) 4 MG tablet Take 4 mg by mouth every 6 (six) hours as needed for nausea or vomiting.  . ranitidine (ZANTAC) 150 MG tablet Take 150 mg by mouth daily. In the morning and at bedtime For reflux  . traZODone (DESYREL) 50 MG tablet Take 50 mg by mouth at bedtime.  Marland Kitchen umeclidinium bromide (INCRUSE ELLIPTA) 62.5 MCG/INH AEPB Inhale 1 puff into the lungs daily.    No facility-administered encounter medications on file as of 06/09/2018.      SIGNIFICANT DIAGNOSTIC EXAMS  NO NEW EXAMS    LABS REVIEWED: PREVIOUS    06-10-17: wbc 11.4; hgb 13.1; hct 41.9; mcv 91.5; plt 288; glucose 109; bun 10.0; creat 0.56; k+ 4.1; na++ 144; ca 9.0; liver normal albumin 3.8; hgb a1c 7.2; chol 153; ldl 79; trig 176; hdl 39; urine micro-albumin 3.2 11-02-17: hgb a1c 6.9 01-18-18:  wbc 10.6; hgb 11.8; hct 36; plt 315; glucose 168; bun 16; creat 0.5; k+ 4.3; na++ 144; liver choll 145; ldl 75 trig 120; hdl 46; tsh 2.20 02-11-18: chol  171; ldl 94; trig 146; hdl 48; vit D 31.52   NO NEW LABS.    Review of Systems  Constitutional: Negative for malaise/fatigue.  Respiratory: Negative for cough and shortness of breath.   Cardiovascular: Negative for chest pain, palpitations and leg swelling.  Gastrointestinal: Negative  for abdominal pain, constipation and heartburn.  Musculoskeletal: Negative for back pain, joint pain and myalgias.  Skin: Negative.   Neurological: Negative for dizziness.  Psychiatric/Behavioral: The patient is not nervous/anxious.      Physical Exam  Constitutional: She is oriented to person, place, and time. She appears well-developed and well-nourished. No distress.  Neck: No thyromegaly present.  Cardiovascular: Normal rate, regular rhythm and intact distal pulses.  Murmur heard. 1/6  Pulmonary/Chest: Effort normal and breath sounds normal. No respiratory distress.  Abdominal: Soft. Bowel sounds are normal. She exhibits no distension. There is no tenderness.  Musculoskeletal: Normal range of motion. She exhibits no edema.  Lymphadenopathy:    She has no cervical adenopathy.  Neurological: She is alert and oriented to person, place, and time.  Skin: Skin is warm and dry. She is not diaphoretic.  Psychiatric: She has a normal mood and affect.      ASSESSMENT/ PLAN:  TODAY:   1. Hyperlipidemia associated with type 2 diabetes mellitus: stable ldl is 94; trig 146 will continue lipitor 10 mg daily will monitor her status.    2. Vascular dementia with paranioa: without change in her status; will continue namzaric 28-10 mg daily and will monitor her weight is 187 pounds.   3. 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 Chronic low back pain without sciatica: she is presently stable; will continue  neurontin 100 mg three times daily; cymbalta 60 mg daily  will monitor her status.   PREVIOUS   5. Major depressive disorder, recurrent episode: she is stable and  is benefiting from cymbalta 60 mg daily (also takes for pain management) will not make changes will continue trazodone 50 mg nightly for sleep.   6. Type  2 diabetes mellitus with complication with long term current use of insulin: her hgb a1c is 6.9 (previous 7.2). Stable Will continue  tradjenta 5  mg daily levemir 30 units nightly and will continue metformin 500 mg daily Is on asa ; could not tolerate low does ace   7.  Hypertension associated with diabetes: b/p 99/58 stable   will continue asa 81 mg daily    Could not tolerate ACE at low dose will monitor  8. COPD: stable  will continue;mucinex 1200 mg  twice daily ; Incruse ellipta 1  puff daily  flonase 2 puffs nightly for allergic rhinitis    MD is aware of resident's narcotic use and is in agreement with current plan of care. We will attempt to wean resident as apropriate   Ok Edwards NP [facility]  Contact [phone] Monday through Friday 8am- 5pm  After hours call [phone]

## 2018-06-24 DIAGNOSIS — J449 Chronic obstructive pulmonary disease, unspecified: Secondary | ICD-10-CM | POA: Diagnosis not present

## 2018-06-24 DIAGNOSIS — M6281 Muscle weakness (generalized): Secondary | ICD-10-CM | POA: Diagnosis not present

## 2018-06-24 DIAGNOSIS — R278 Other lack of coordination: Secondary | ICD-10-CM | POA: Diagnosis not present

## 2018-06-24 DIAGNOSIS — R2689 Other abnormalities of gait and mobility: Secondary | ICD-10-CM | POA: Diagnosis not present

## 2018-06-24 DIAGNOSIS — F0390 Unspecified dementia without behavioral disturbance: Secondary | ICD-10-CM | POA: Diagnosis not present

## 2018-06-27 DIAGNOSIS — F0390 Unspecified dementia without behavioral disturbance: Secondary | ICD-10-CM | POA: Diagnosis not present

## 2018-06-27 DIAGNOSIS — R2689 Other abnormalities of gait and mobility: Secondary | ICD-10-CM | POA: Diagnosis not present

## 2018-06-27 DIAGNOSIS — J449 Chronic obstructive pulmonary disease, unspecified: Secondary | ICD-10-CM | POA: Diagnosis not present

## 2018-06-27 DIAGNOSIS — M6281 Muscle weakness (generalized): Secondary | ICD-10-CM | POA: Diagnosis not present

## 2018-06-27 DIAGNOSIS — R278 Other lack of coordination: Secondary | ICD-10-CM | POA: Diagnosis not present

## 2018-06-28 DIAGNOSIS — R278 Other lack of coordination: Secondary | ICD-10-CM | POA: Diagnosis not present

## 2018-06-28 DIAGNOSIS — R2689 Other abnormalities of gait and mobility: Secondary | ICD-10-CM | POA: Diagnosis not present

## 2018-06-28 DIAGNOSIS — F5105 Insomnia due to other mental disorder: Secondary | ICD-10-CM | POA: Diagnosis not present

## 2018-06-28 DIAGNOSIS — F0390 Unspecified dementia without behavioral disturbance: Secondary | ICD-10-CM | POA: Diagnosis not present

## 2018-06-28 DIAGNOSIS — J449 Chronic obstructive pulmonary disease, unspecified: Secondary | ICD-10-CM | POA: Diagnosis not present

## 2018-06-28 DIAGNOSIS — F331 Major depressive disorder, recurrent, moderate: Secondary | ICD-10-CM | POA: Diagnosis not present

## 2018-06-28 DIAGNOSIS — M6281 Muscle weakness (generalized): Secondary | ICD-10-CM | POA: Diagnosis not present

## 2018-06-29 DIAGNOSIS — R278 Other lack of coordination: Secondary | ICD-10-CM | POA: Diagnosis not present

## 2018-06-29 DIAGNOSIS — F0390 Unspecified dementia without behavioral disturbance: Secondary | ICD-10-CM | POA: Diagnosis not present

## 2018-06-29 DIAGNOSIS — R2689 Other abnormalities of gait and mobility: Secondary | ICD-10-CM | POA: Diagnosis not present

## 2018-06-29 DIAGNOSIS — M6281 Muscle weakness (generalized): Secondary | ICD-10-CM | POA: Diagnosis not present

## 2018-06-29 DIAGNOSIS — J449 Chronic obstructive pulmonary disease, unspecified: Secondary | ICD-10-CM | POA: Diagnosis not present

## 2018-06-30 DIAGNOSIS — M6281 Muscle weakness (generalized): Secondary | ICD-10-CM | POA: Diagnosis not present

## 2018-06-30 DIAGNOSIS — F0390 Unspecified dementia without behavioral disturbance: Secondary | ICD-10-CM | POA: Diagnosis not present

## 2018-06-30 DIAGNOSIS — R278 Other lack of coordination: Secondary | ICD-10-CM | POA: Diagnosis not present

## 2018-06-30 DIAGNOSIS — J449 Chronic obstructive pulmonary disease, unspecified: Secondary | ICD-10-CM | POA: Diagnosis not present

## 2018-06-30 DIAGNOSIS — R2689 Other abnormalities of gait and mobility: Secondary | ICD-10-CM | POA: Diagnosis not present

## 2018-07-01 DIAGNOSIS — R2689 Other abnormalities of gait and mobility: Secondary | ICD-10-CM | POA: Diagnosis not present

## 2018-07-01 DIAGNOSIS — R278 Other lack of coordination: Secondary | ICD-10-CM | POA: Diagnosis not present

## 2018-07-01 DIAGNOSIS — M6281 Muscle weakness (generalized): Secondary | ICD-10-CM | POA: Diagnosis not present

## 2018-07-01 DIAGNOSIS — J449 Chronic obstructive pulmonary disease, unspecified: Secondary | ICD-10-CM | POA: Diagnosis not present

## 2018-07-01 DIAGNOSIS — F0390 Unspecified dementia without behavioral disturbance: Secondary | ICD-10-CM | POA: Diagnosis not present

## 2018-07-03 DIAGNOSIS — R278 Other lack of coordination: Secondary | ICD-10-CM | POA: Diagnosis not present

## 2018-07-03 DIAGNOSIS — R2689 Other abnormalities of gait and mobility: Secondary | ICD-10-CM | POA: Diagnosis not present

## 2018-07-03 DIAGNOSIS — J449 Chronic obstructive pulmonary disease, unspecified: Secondary | ICD-10-CM | POA: Diagnosis not present

## 2018-07-03 DIAGNOSIS — F0390 Unspecified dementia without behavioral disturbance: Secondary | ICD-10-CM | POA: Diagnosis not present

## 2018-07-03 DIAGNOSIS — M6281 Muscle weakness (generalized): Secondary | ICD-10-CM | POA: Diagnosis not present

## 2018-07-04 DIAGNOSIS — J449 Chronic obstructive pulmonary disease, unspecified: Secondary | ICD-10-CM | POA: Diagnosis not present

## 2018-07-04 DIAGNOSIS — M6281 Muscle weakness (generalized): Secondary | ICD-10-CM | POA: Diagnosis not present

## 2018-07-04 DIAGNOSIS — F0390 Unspecified dementia without behavioral disturbance: Secondary | ICD-10-CM | POA: Diagnosis not present

## 2018-07-04 DIAGNOSIS — R2689 Other abnormalities of gait and mobility: Secondary | ICD-10-CM | POA: Diagnosis not present

## 2018-07-04 DIAGNOSIS — R278 Other lack of coordination: Secondary | ICD-10-CM | POA: Diagnosis not present

## 2018-07-05 DIAGNOSIS — M6281 Muscle weakness (generalized): Secondary | ICD-10-CM | POA: Diagnosis not present

## 2018-07-05 DIAGNOSIS — R2689 Other abnormalities of gait and mobility: Secondary | ICD-10-CM | POA: Diagnosis not present

## 2018-07-05 DIAGNOSIS — F0390 Unspecified dementia without behavioral disturbance: Secondary | ICD-10-CM | POA: Diagnosis not present

## 2018-07-05 DIAGNOSIS — R278 Other lack of coordination: Secondary | ICD-10-CM | POA: Diagnosis not present

## 2018-07-05 DIAGNOSIS — J449 Chronic obstructive pulmonary disease, unspecified: Secondary | ICD-10-CM | POA: Diagnosis not present

## 2018-07-06 DIAGNOSIS — R278 Other lack of coordination: Secondary | ICD-10-CM | POA: Diagnosis not present

## 2018-07-06 DIAGNOSIS — J449 Chronic obstructive pulmonary disease, unspecified: Secondary | ICD-10-CM | POA: Diagnosis not present

## 2018-07-06 DIAGNOSIS — M6281 Muscle weakness (generalized): Secondary | ICD-10-CM | POA: Diagnosis not present

## 2018-07-06 DIAGNOSIS — F0390 Unspecified dementia without behavioral disturbance: Secondary | ICD-10-CM | POA: Diagnosis not present

## 2018-07-06 DIAGNOSIS — R2689 Other abnormalities of gait and mobility: Secondary | ICD-10-CM | POA: Diagnosis not present

## 2018-08-09 DIAGNOSIS — F5105 Insomnia due to other mental disorder: Secondary | ICD-10-CM | POA: Diagnosis not present

## 2018-08-09 DIAGNOSIS — F0390 Unspecified dementia without behavioral disturbance: Secondary | ICD-10-CM | POA: Diagnosis not present

## 2018-08-09 DIAGNOSIS — F331 Major depressive disorder, recurrent, moderate: Secondary | ICD-10-CM | POA: Diagnosis not present

## 2018-08-11 DIAGNOSIS — G4700 Insomnia, unspecified: Secondary | ICD-10-CM | POA: Diagnosis not present

## 2018-08-11 DIAGNOSIS — J449 Chronic obstructive pulmonary disease, unspecified: Secondary | ICD-10-CM | POA: Diagnosis not present

## 2018-08-11 DIAGNOSIS — K219 Gastro-esophageal reflux disease without esophagitis: Secondary | ICD-10-CM | POA: Diagnosis not present

## 2018-08-11 DIAGNOSIS — E785 Hyperlipidemia, unspecified: Secondary | ICD-10-CM | POA: Diagnosis not present

## 2018-08-17 DIAGNOSIS — E785 Hyperlipidemia, unspecified: Secondary | ICD-10-CM | POA: Diagnosis not present

## 2018-08-17 DIAGNOSIS — G4700 Insomnia, unspecified: Secondary | ICD-10-CM | POA: Diagnosis not present

## 2018-08-17 DIAGNOSIS — J449 Chronic obstructive pulmonary disease, unspecified: Secondary | ICD-10-CM | POA: Diagnosis not present

## 2018-08-17 DIAGNOSIS — K219 Gastro-esophageal reflux disease without esophagitis: Secondary | ICD-10-CM | POA: Diagnosis not present

## 2018-08-18 DIAGNOSIS — D649 Anemia, unspecified: Secondary | ICD-10-CM | POA: Diagnosis not present

## 2018-08-18 DIAGNOSIS — E119 Type 2 diabetes mellitus without complications: Secondary | ICD-10-CM | POA: Diagnosis not present

## 2018-08-18 DIAGNOSIS — E039 Hypothyroidism, unspecified: Secondary | ICD-10-CM | POA: Diagnosis not present

## 2018-08-18 DIAGNOSIS — E785 Hyperlipidemia, unspecified: Secondary | ICD-10-CM | POA: Diagnosis not present

## 2018-09-01 DIAGNOSIS — F5105 Insomnia due to other mental disorder: Secondary | ICD-10-CM | POA: Diagnosis not present

## 2018-09-01 DIAGNOSIS — F331 Major depressive disorder, recurrent, moderate: Secondary | ICD-10-CM | POA: Diagnosis not present

## 2018-09-01 DIAGNOSIS — F0390 Unspecified dementia without behavioral disturbance: Secondary | ICD-10-CM | POA: Diagnosis not present

## 2018-09-08 DIAGNOSIS — E785 Hyperlipidemia, unspecified: Secondary | ICD-10-CM | POA: Diagnosis not present

## 2018-09-08 DIAGNOSIS — K219 Gastro-esophageal reflux disease without esophagitis: Secondary | ICD-10-CM | POA: Diagnosis not present

## 2018-09-08 DIAGNOSIS — G4700 Insomnia, unspecified: Secondary | ICD-10-CM | POA: Diagnosis not present

## 2018-09-08 DIAGNOSIS — J449 Chronic obstructive pulmonary disease, unspecified: Secondary | ICD-10-CM | POA: Diagnosis not present

## 2018-09-14 DIAGNOSIS — K219 Gastro-esophageal reflux disease without esophagitis: Secondary | ICD-10-CM | POA: Diagnosis not present

## 2018-09-14 DIAGNOSIS — J449 Chronic obstructive pulmonary disease, unspecified: Secondary | ICD-10-CM | POA: Diagnosis not present

## 2018-09-14 DIAGNOSIS — F0390 Unspecified dementia without behavioral disturbance: Secondary | ICD-10-CM | POA: Diagnosis not present

## 2018-09-14 DIAGNOSIS — G4700 Insomnia, unspecified: Secondary | ICD-10-CM | POA: Diagnosis not present

## 2018-09-19 DIAGNOSIS — E113293 Type 2 diabetes mellitus with mild nonproliferative diabetic retinopathy without macular edema, bilateral: Secondary | ICD-10-CM | POA: Diagnosis not present

## 2018-09-19 DIAGNOSIS — Z794 Long term (current) use of insulin: Secondary | ICD-10-CM | POA: Diagnosis not present

## 2018-09-19 DIAGNOSIS — H40023 Open angle with borderline findings, high risk, bilateral: Secondary | ICD-10-CM | POA: Diagnosis not present

## 2018-09-19 DIAGNOSIS — Z7984 Long term (current) use of oral hypoglycemic drugs: Secondary | ICD-10-CM | POA: Diagnosis not present

## 2018-09-27 ENCOUNTER — Emergency Department (HOSPITAL_COMMUNITY): Payer: Medicare Other

## 2018-09-27 ENCOUNTER — Encounter (HOSPITAL_COMMUNITY): Payer: Self-pay | Admitting: *Deleted

## 2018-09-27 ENCOUNTER — Emergency Department (HOSPITAL_COMMUNITY)
Admission: EM | Admit: 2018-09-27 | Discharge: 2018-09-27 | Disposition: A | Discharge status: Home / Self Care | Payer: Medicare Other | Attending: Emergency Medicine | Admitting: Emergency Medicine

## 2018-09-27 ENCOUNTER — Other Ambulatory Visit: Payer: Self-pay

## 2018-09-27 DIAGNOSIS — I10 Essential (primary) hypertension: Secondary | ICD-10-CM | POA: Diagnosis not present

## 2018-09-27 DIAGNOSIS — Y939 Activity, unspecified: Secondary | ICD-10-CM | POA: Insufficient documentation

## 2018-09-27 DIAGNOSIS — Y999 Unspecified external cause status: Secondary | ICD-10-CM | POA: Diagnosis not present

## 2018-09-27 DIAGNOSIS — S0003XA Contusion of scalp, initial encounter: Secondary | ICD-10-CM | POA: Diagnosis not present

## 2018-09-27 DIAGNOSIS — Z7984 Long term (current) use of oral hypoglycemic drugs: Secondary | ICD-10-CM | POA: Insufficient documentation

## 2018-09-27 DIAGNOSIS — M7989 Other specified soft tissue disorders: Secondary | ICD-10-CM | POA: Diagnosis not present

## 2018-09-27 DIAGNOSIS — S8992XA Unspecified injury of left lower leg, initial encounter: Secondary | ICD-10-CM | POA: Diagnosis not present

## 2018-09-27 DIAGNOSIS — S8002XA Contusion of left knee, initial encounter: Secondary | ICD-10-CM | POA: Insufficient documentation

## 2018-09-27 DIAGNOSIS — Y92122 Bedroom in nursing home as the place of occurrence of the external cause: Secondary | ICD-10-CM | POA: Diagnosis not present

## 2018-09-27 DIAGNOSIS — Y92129 Unspecified place in nursing home as the place of occurrence of the external cause: Secondary | ICD-10-CM

## 2018-09-27 DIAGNOSIS — E119 Type 2 diabetes mellitus without complications: Secondary | ICD-10-CM | POA: Diagnosis not present

## 2018-09-27 DIAGNOSIS — S8001XA Contusion of right knee, initial encounter: Secondary | ICD-10-CM | POA: Insufficient documentation

## 2018-09-27 DIAGNOSIS — Z79899 Other long term (current) drug therapy: Secondary | ICD-10-CM | POA: Diagnosis not present

## 2018-09-27 DIAGNOSIS — S8991XA Unspecified injury of right lower leg, initial encounter: Secondary | ICD-10-CM | POA: Diagnosis not present

## 2018-09-27 DIAGNOSIS — W1830XA Fall on same level, unspecified, initial encounter: Secondary | ICD-10-CM | POA: Insufficient documentation

## 2018-09-27 DIAGNOSIS — S8000XA Contusion of unspecified knee, initial encounter: Secondary | ICD-10-CM

## 2018-09-27 DIAGNOSIS — F0390 Unspecified dementia without behavioral disturbance: Secondary | ICD-10-CM | POA: Insufficient documentation

## 2018-09-27 DIAGNOSIS — S199XXA Unspecified injury of neck, initial encounter: Secondary | ICD-10-CM | POA: Diagnosis not present

## 2018-09-27 DIAGNOSIS — W19XXXA Unspecified fall, initial encounter: Secondary | ICD-10-CM

## 2018-09-27 DIAGNOSIS — S0990XA Unspecified injury of head, initial encounter: Secondary | ICD-10-CM | POA: Diagnosis present

## 2018-09-27 MED ORDER — ACETAMINOPHEN 500 MG PO TABS
500.0000 mg | ORAL_TABLET | Freq: Once | ORAL | Status: AC
  Administered 2018-09-27: 500 mg via ORAL
  Filled 2018-09-27: qty 1

## 2018-09-27 NOTE — ED Notes (Signed)
Pt had soiled her depends and linens. Pt cleaned and linens changed. Pericare performed. Pt transported back to SNF via [facility]

## 2018-09-27 NOTE — ED Triage Notes (Signed)
Pt BIB EMS and coming from [facility].  Pt presents after a fall.  Pt's bed was ground level and staff found pt sitting upright next to bed.  EMS did not note any obvious deformities.  Staff at facility reports palpating a hematoma to the back of pt's head.  No hematoma palpated on arrival to ED.  Pt is sensitive to any form of touching including application of EKG stickers by EMS. Hx dementia

## 2018-09-27 NOTE — ED Provider Notes (Signed)
[facility] DEPT Provider Note   CSN: [id number] Arrival date & time: 09/27/18  0046  Time seen 1:40 AM  History   Chief Complaint Chief Complaint  Patient presents with  . Fall    HPI [name] [name] is a 78 y.o. female.   Level 5 caveat for dementia  HPI per EMS patient fell at her nursing facility.  They state her bed is kept on the ground level when she was found sitting next to her bed.  They report there was a hematoma present per facility staff.  When I asked the patient how she is feeling she states she is having abdominal pain.  And when I asked her if she has had pain or neck pain she says yes.  She also states her knees hurt.  PCP Patient, No Pcp Per   Past Medical History:  Diagnosis Date  . Abnormality of gait   . Anxiety state, unspecified   . Candidiasis of other urogenital sites   . Chronic airway obstruction, not elsewhere classified   . Chronic obstructive lung disease ([place])   . Depressive disorder, not elsewhere classified   . Esophageal reflux   . Hyperlipemia   . Lumbago   . Osteoporosis   . Spinal stenosis   . 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 Unspecified dementia without behavioral disturbance [facility])     Patient Active Problem List   Diagnosis Date Noted  . Dry eye 07/09/2017  . Weight loss, non-intentional 04/01/2017  . Hypertension associated with diabetes ([place]) 01/04/2017  . Major depressive disorder, recurrent episode ([place]) 01/14/2015  . Hyperlipidemia associated with type 2 diabetes mellitus ([place]) 06/03/2014  . Chronic obstructive pulmonary disease ([place]) 06/03/2014  . 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 Type 2 diabetes mellitus with complication, with long-term current use of insulin ([place]) 10/04/2013  . Anxiety state 01/20/2013  . Hypokalemia 01/20/2013  . Chronic low back pain without sciatica 01/20/2013  . GERD without esophagitis 01/16/2008    Past  Surgical History:  Procedure Laterality Date  . ABDOMINAL HYSTERECTOMY    . APPENDECTOMY    . CHOLECYSTECTOMY    . VERTEBROPLASTY       OB History   No obstetric history on file.      Home Medications    Prior to Admission medications   Medication Sig Start Date End Date Taking? Authorizing Provider  acetaminophen (TYLENOL) 325 MG tablet Take 325 mg by mouth every 4 (four) hours as needed (pain).    Yes [provider]  atorvastatin (LIPITOR) 10 MG tablet Take 10 mg by mouth daily.   Yes [provider]  bisacodyl (DULCOLAX) 10 MG suppository Place 10 mg rectally daily as needed for moderate constipation.   Yes [provider]  fluticasone (FLONASE) 50 MCG/ACT nasal spray Place 2 sprays into both nostrils at bedtime.   Yes [provider]  gabapentin (NEURONTIN) 100 MG capsule Take 1 capsule (100 mg total) by mouth 3 (three) times daily. 06/20/14  Yes Reed, Tiffany L, DO  Guaifenesin (MUCINEX MAXIMUM STRENGTH) 1200 MG TB12 Take 1 tablet by mouth 2 (two) times daily.    Yes [provider]  Hypromellose 0.4 % SOLN Place 2 drops into both eyes 2 (two) times daily.    Yes [provider]  Insulin Detemir (LEVEMIR FLEXTOUCH) 100 UNIT/ML Pen Inject 30 Units into the skin at bedtime.  07/06/17  Yes [provider]  linagliptin (  TRADJENTA) 5 MG TABS tablet Take 5 mg by mouth daily. 01/21/18  Yes [provider]  Melatonin 3 MG TABS Take 6 mg by mouth at bedtime.  04/14/18  Yes [provider]  Memantine HCl-Donepezil HCl (NAMZARIC) 28-10 MG CP24 Take 1 capsule by mouth every evening. For dementia   Yes [provider]  metFORMIN (GLUCOPHAGE) 500 MG tablet Take 500 mg by mouth daily with breakfast.   Yes [provider]  ondansetron (ZOFRAN) 4 MG tablet Take 4 mg by mouth every 6 (six) hours as needed for nausea or vomiting. 04/24/18  Yes [provider]  ranitidine (ZANTAC) 150 MG tablet  Take 150 mg by mouth daily. In the morning and at bedtime For reflux   Yes [provider]  umeclidinium bromide (INCRUSE ELLIPTA) 62.5 MCG/INH AEPB Inhale 1 puff into the lungs daily.    Yes [provider]  aspirin EC 81 MG tablet Take 1 tablet (81 mg total) by mouth daily. Patient not taking: Reported on 09/27/2018 11/06/14   Gerlene Fee, NP  Nutritional Supplements (NUTRITIONAL SUPPLEMENT PO) Low concentrated sweets diet - Regular texture, NAS    [provider]    Family History Family History  Family history unknown: Yes    Social History Social History   Tobacco Use  . Smoking status: Former Smoker    Packs/day: 1.00    Years: 20.00    Pack years: 20.00  . Smokeless tobacco: Never Used  Substance Use Topics  . Alcohol use: No  . Drug use: No  Patient is from a nursing home   Allergies   Avelox [moxifloxacin hcl in nacl]; Codeine; Compazine [prochlorperazine edisylate]; Morphine and related; Mycobacterium; Penicillins; Phenothiazines; Prednisone; Reglan [metoclopramide]; Sulfonamide derivatives; and Tuberculin tests   Review of Systems Review of Systems  Unable to perform ROS: Dementia     Physical Exam Updated Vital Signs BP 107/65   Pulse 72   Temp 98.2 F (36.8 C) (Oral)   Resp 16   SpO2 95%   Vital signs normal    Physical Exam Vitals signs and nursing note reviewed.  Constitutional:      Appearance: Normal appearance.  HENT:     Head: Normocephalic and atraumatic.     Comments: I do not see any obvious signs of trauma however patient states her head is tender when it is palpated    Nose: Nose normal.     Mouth/Throat:     Mouth: Mucous membranes are dry.  Eyes:     Extraocular Movements: Extraocular movements intact.     Conjunctiva/sclera: Conjunctivae normal.     Pupils: Pupils are equal, round, and reactive to light.  Neck:     Comments: Cervical collar is in place but when I examine her neck through the  collar she states it is painful. Cardiovascular:     Rate and Rhythm: Normal rate and regular rhythm.  Pulmonary:     Effort: Pulmonary effort is normal. No respiratory distress.  Abdominal:     General: Abdomen is flat. Bowel sounds are normal.     Palpations: Abdomen is soft.     Tenderness: There is no abdominal tenderness.  Musculoskeletal: Normal range of motion.     Comments: Patient appears to have a large area of swelling and bruising on the medial aspect of both knees.  She does not appear to have pain when she flexes and extends her right knee however she states her left knee is painful.  She denies any pain in her hips.  Skin:    General: Skin is warm and dry.     Findings: Bruising present.  Neurological:     General: No focal deficit present.     Mental Status: She is alert.     Cranial Nerves: No cranial nerve deficit.  Psychiatric:        Mood and Affect: Mood normal.     Comments: Patient is repeating over and over that she is ready to go home      ED Treatments / Results  Labs (all labs ordered are listed, but only abnormal results are displayed) Labs Reviewed - No data to display  EKG None  Radiology Dg Cervical Spine Complete  Result Date: 09/27/2018 CLINICAL DATA:  Fall at nursing home EXAM: CERVICAL SPINE - COMPLETE 4+ VIEW COMPARISON:  Cervical spine CT from earlier today FINDINGS: Very limited study due to difficulty with patient positioning. No appreciable fracture or traumatic malalignment. There is prevertebral soft tissue thickening that is fat and muscle based on prior CT. Degenerative disc narrowing at C3-4 and below. IMPRESSION: Limited study without acute finding. Electronically Signed   By: Monte Fantasia M.D.   On: 09/27/2018 04:56   Ct Head Wo Contrast  Ct Cervical Spine Wo Contrast  Result Date: 09/27/2018 CLINICAL DATA:  78 year old female with unwitnessed fall. Posterior scalp hematoma. EXAM: CT HEAD WITHOUT CONTRAST CT CERVICAL SPINE  WITHOUT CONTRAST TECHNIQUE: Multidetector CT imaging of the head and cervical spine was performed following the standard protocol without intravenous contrast. Multiplanar CT image reconstructions of the cervical spine were also generated. COMPARISON:  Head and cervical spine CT 05/13/2011. FINDINGS: CT HEAD FINDINGS Brain: Chronic encephalomalacia in the right parietal lobe and posterior corona radiata appears stable since 2012. Stable cerebral volume. No midline shift, ventriculomegaly, mass effect, evidence of mass lesion, intracranial hemorrhage or evidence of cortically based acute infarction. Vascular: Calcified atherosclerosis at the skull base. No suspicious intracranial vascular hyperdensity. Skull: Intact. Sinuses/Orbits: Visualized paranasal sinuses and mastoids are stable and well pneumatized. Other: Negative orbits.  No discrete scalp hematoma identified. CT CERVICAL SPINE FINDINGS Study is intermittently degraded by motion artifact despite repeated imaging attempts. In particular, there is poor bone detail of the C5 through C7 levels. Skull base and vertebrae: Visualized skull base is intact. No atlanto-occipital dissociation. The C1 through C4 levels appear intact. Upper chest: Visible upper thoracic levels appear grossly intact. Calcified aortic atherosclerosis. There is a partially visible lung nodule in the superior segment of the left lower lobe on series 12, image 17 which is roughly 15 millimeters. IMPRESSION: 1. No acute intracranial abnormality. Chronic right parietal lobe infarct. 2. Insufficient bone detail of the C5 through C7 levels. No cervical spine fracture C1 through C4, but consider follow-up Cervical Spine Radiographs to clear the rest. 3. Partially visible superior segment left lower lobe lung nodule measuring 15 mm. Recommend follow-up noncontrast chest CT, which can be done on a non-emergent basis. Electronically Signed   By: Genevie Ann M.D.   On: 09/27/2018 03:33   Dg Knee  Complete 4 Views Left  Result Date: 09/27/2018 CLINICAL DATA:  Initial evaluation for acute trauma, fall. EXAM: LEFT KNEE - COMPLETE 4+ VIEW COMPARISON:  None. FINDINGS: No acute fracture dislocation. No joint effusion. Mild degenerative osteoarthritic changes about the knee. No appreciable soft tissue injury. Scattered vascular calcifications noted. IMPRESSION: No acute osseous abnormality about the left knee. Electronically Signed   By: Pincus Badder.D.  On: 09/27/2018 02:30   Dg Knee Complete 4 Views Right  Result Date: 09/27/2018 CLINICAL DATA:  Initial evaluation for acute trauma, unwitnessed fall. EXAM: RIGHT KNEE - COMPLETE 4+ VIEW COMPARISON:  None. FINDINGS: No acute fracture or dislocation. No joint effusion. Mild degenerative osteoarthritic changes about the knee. Superimposed chondrocalcinosis. Osteopenia. Soft tissue swelling at the right suprapatellar region, likely contusion. Few scattered vascular calcifications noted. IMPRESSION: 1. No acute osseous abnormality. 2. Mild soft tissue swelling overlying the superior right patella, likely contusion. Electronically Signed   By: Jeannine Boga M.D.   On: 09/27/2018 02:28    Procedures Procedures (including critical care time)  Medications Ordered in ED Medications  acetaminophen (TYLENOL) tablet 500 mg (500 mg Oral Given 09/27/18 0235)     Initial Impression / Assessment and Plan / ED Course  I have reviewed the triage vital signs and the nursing notes.  Pertinent labs & imaging results that were available during my care of the patient were reviewed by me and considered in my medical decision making (see chart for details).        CT of the head and neck was done due to possible "fall".  Patient seems to have some bruising on her knees and sometimes she states they hurt.  X-rays were done.  Patient had motion of her CT scan and radiologist recommended regular x-rays.  Those were ordered.  Patient's blood  pressure dropped into the 90s without change in her mental status.  Patient complained of some abdominal pain.  She however has been asking for something to eat.  Patient was given some food and her repeat blood pressure is improved.  She was discharged back to her facility.  Final Clinical Impressions(s) / ED Diagnoses   Final diagnoses:  Fall at nursing home, initial encounter  Contusion of knee, unspecified laterality, initial encounter    ED Discharge Orders    None     Plan discharge  Rolland Porter, MD, Barbette Or, MD 09/27/18 [phone]

## 2018-09-27 NOTE — ED Notes (Signed)
Dr. Tomi Bamberger made aware of pt's BP of 92/42.

## 2018-09-27 NOTE — Discharge Instructions (Addendum)
Her head and cervical spine CTs do not show any obvious injury.  Her knees were x-rayed because it appears she has some bruising on the inner aspect of both knees.  Her x-rays do not show any acute fracture.  She can have Tylenol if needed for pain.  Recheck if she has any further problems or change in behavior.

## 2018-09-27 NOTE — ED Notes (Signed)
[facility] called for transport.  

## 2018-09-27 NOTE — ED Notes (Signed)
Pt continually states, "Get me out of here! I want to go home." Staff has re-oriented pt multiples time and told her she would go home if her scans come back negative.

## 2018-09-27 NOTE — ED Notes (Signed)
Bed: KB52 Expected date:  Expected time:  Means of arrival:  Comments: 78 yo F/Fall

## 2018-09-27 NOTE — ED Notes (Signed)
Pt in radiology 

## 2018-09-28 DIAGNOSIS — W19XXXA Unspecified fall, initial encounter: Secondary | ICD-10-CM | POA: Diagnosis not present

## 2018-09-28 DIAGNOSIS — R51 Headache: Secondary | ICD-10-CM | POA: Diagnosis not present

## 2018-09-28 DIAGNOSIS — R52 Pain, unspecified: Secondary | ICD-10-CM | POA: Diagnosis not present

## 2018-09-30 DIAGNOSIS — F0390 Unspecified dementia without behavioral disturbance: Secondary | ICD-10-CM | POA: Diagnosis not present

## 2018-09-30 DIAGNOSIS — M6281 Muscle weakness (generalized): Secondary | ICD-10-CM | POA: Diagnosis not present

## 2018-10-02 DIAGNOSIS — M6281 Muscle weakness (generalized): Secondary | ICD-10-CM | POA: Diagnosis not present

## 2018-10-02 DIAGNOSIS — F0390 Unspecified dementia without behavioral disturbance: Secondary | ICD-10-CM | POA: Diagnosis not present

## 2018-10-03 DIAGNOSIS — M6281 Muscle weakness (generalized): Secondary | ICD-10-CM | POA: Diagnosis not present

## 2018-10-03 DIAGNOSIS — F0390 Unspecified dementia without behavioral disturbance: Secondary | ICD-10-CM | POA: Diagnosis not present

## 2018-10-04 DIAGNOSIS — M6281 Muscle weakness (generalized): Secondary | ICD-10-CM | POA: Diagnosis not present

## 2018-10-04 DIAGNOSIS — F0390 Unspecified dementia without behavioral disturbance: Secondary | ICD-10-CM | POA: Diagnosis not present

## 2018-10-05 DIAGNOSIS — M6281 Muscle weakness (generalized): Secondary | ICD-10-CM | POA: Diagnosis not present

## 2018-10-05 DIAGNOSIS — F0390 Unspecified dementia without behavioral disturbance: Secondary | ICD-10-CM | POA: Diagnosis not present

## 2018-10-06 DIAGNOSIS — F5105 Insomnia due to other mental disorder: Secondary | ICD-10-CM | POA: Diagnosis not present

## 2018-10-06 DIAGNOSIS — F0390 Unspecified dementia without behavioral disturbance: Secondary | ICD-10-CM | POA: Diagnosis not present

## 2018-10-06 DIAGNOSIS — F331 Major depressive disorder, recurrent, moderate: Secondary | ICD-10-CM | POA: Diagnosis not present

## 2018-10-08 DIAGNOSIS — F0390 Unspecified dementia without behavioral disturbance: Secondary | ICD-10-CM | POA: Diagnosis not present

## 2018-10-08 DIAGNOSIS — G4700 Insomnia, unspecified: Secondary | ICD-10-CM | POA: Diagnosis not present

## 2018-10-08 DIAGNOSIS — J449 Chronic obstructive pulmonary disease, unspecified: Secondary | ICD-10-CM | POA: Diagnosis not present

## 2018-10-08 DIAGNOSIS — K219 Gastro-esophageal reflux disease without esophagitis: Secondary | ICD-10-CM | POA: Diagnosis not present

## 2018-10-10 DIAGNOSIS — F0390 Unspecified dementia without behavioral disturbance: Secondary | ICD-10-CM | POA: Diagnosis not present

## 2018-10-10 DIAGNOSIS — M6281 Muscle weakness (generalized): Secondary | ICD-10-CM | POA: Diagnosis not present

## 2018-10-11 DIAGNOSIS — M6281 Muscle weakness (generalized): Secondary | ICD-10-CM | POA: Diagnosis not present

## 2018-10-11 DIAGNOSIS — F0390 Unspecified dementia without behavioral disturbance: Secondary | ICD-10-CM | POA: Diagnosis not present

## 2018-10-12 DIAGNOSIS — F0390 Unspecified dementia without behavioral disturbance: Secondary | ICD-10-CM | POA: Diagnosis not present

## 2018-10-12 DIAGNOSIS — M6281 Muscle weakness (generalized): Secondary | ICD-10-CM | POA: Diagnosis not present

## 2018-10-13 DIAGNOSIS — F0390 Unspecified dementia without behavioral disturbance: Secondary | ICD-10-CM | POA: Diagnosis not present

## 2018-10-13 DIAGNOSIS — M6281 Muscle weakness (generalized): Secondary | ICD-10-CM | POA: Diagnosis not present

## 2018-10-14 DIAGNOSIS — F0390 Unspecified dementia without behavioral disturbance: Secondary | ICD-10-CM | POA: Diagnosis not present

## 2018-10-14 DIAGNOSIS — M6281 Muscle weakness (generalized): Secondary | ICD-10-CM | POA: Diagnosis not present

## 2018-10-19 ENCOUNTER — Other Ambulatory Visit: Payer: Self-pay

## 2018-10-19 ENCOUNTER — Emergency Department (HOSPITAL_COMMUNITY)
Admission: EM | Admit: 2018-10-19 | Discharge: 2018-10-19 | Disposition: A | Discharge status: Home / Self Care | Payer: Medicare Other | Attending: Emergency Medicine | Admitting: Emergency Medicine

## 2018-10-19 ENCOUNTER — Emergency Department (HOSPITAL_COMMUNITY): Payer: Medicare Other

## 2018-10-19 DIAGNOSIS — I959 Hypotension, unspecified: Secondary | ICD-10-CM | POA: Diagnosis not present

## 2018-10-19 DIAGNOSIS — R456 Violent behavior: Secondary | ICD-10-CM | POA: Diagnosis not present

## 2018-10-19 DIAGNOSIS — Z79899 Other long term (current) drug therapy: Secondary | ICD-10-CM | POA: Diagnosis not present

## 2018-10-19 DIAGNOSIS — F0390 Unspecified dementia without behavioral disturbance: Secondary | ICD-10-CM | POA: Insufficient documentation

## 2018-10-19 DIAGNOSIS — Z87891 Personal history of nicotine dependence: Secondary | ICD-10-CM | POA: Insufficient documentation

## 2018-10-19 DIAGNOSIS — Y92122 Bedroom in nursing home as the place of occurrence of the external cause: Secondary | ICD-10-CM | POA: Diagnosis not present

## 2018-10-19 DIAGNOSIS — S0990XA Unspecified injury of head, initial encounter: Secondary | ICD-10-CM | POA: Diagnosis not present

## 2018-10-19 DIAGNOSIS — Z7984 Long term (current) use of oral hypoglycemic drugs: Secondary | ICD-10-CM | POA: Diagnosis not present

## 2018-10-19 DIAGNOSIS — W19XXXA Unspecified fall, initial encounter: Secondary | ICD-10-CM | POA: Diagnosis not present

## 2018-10-19 DIAGNOSIS — Y999 Unspecified external cause status: Secondary | ICD-10-CM | POA: Diagnosis not present

## 2018-10-19 DIAGNOSIS — R112 Nausea with vomiting, unspecified: Secondary | ICD-10-CM | POA: Diagnosis not present

## 2018-10-19 DIAGNOSIS — F29 Unspecified psychosis not due to a substance or known physiological condition: Secondary | ICD-10-CM | POA: Diagnosis not present

## 2018-10-19 DIAGNOSIS — W06XXXA Fall from bed, initial encounter: Secondary | ICD-10-CM | POA: Insufficient documentation

## 2018-10-19 DIAGNOSIS — Y939 Activity, unspecified: Secondary | ICD-10-CM | POA: Diagnosis not present

## 2018-10-19 DIAGNOSIS — E119 Type 2 diabetes mellitus without complications: Secondary | ICD-10-CM | POA: Insufficient documentation

## 2018-10-19 DIAGNOSIS — R404 Transient alteration of awareness: Secondary | ICD-10-CM | POA: Diagnosis not present

## 2018-10-19 DIAGNOSIS — S199XXA Unspecified injury of neck, initial encounter: Secondary | ICD-10-CM | POA: Diagnosis not present

## 2018-10-19 NOTE — ED Notes (Signed)
Dispatch called at this time to setup transportation to [facility]

## 2018-10-19 NOTE — ED Notes (Signed)
Report attempted to [facility] x3 at this time without any answer.  Will continue to try.  Transport setup

## 2018-10-19 NOTE — ED Notes (Signed)
Bed: PW25 Expected date:  Expected time:  Means of arrival:  Comments: EMS 78 yo female from [facility]-fall-no visible injuries/dementia

## 2018-10-19 NOTE — ED Notes (Signed)
Pt to CT at this time.

## 2018-10-19 NOTE — ED Triage Notes (Signed)
Pt came to the ED from ECF post unwitnessed fall.  No reported LOC.  Pt has complaints of pain all over.

## 2018-10-19 NOTE — ED Provider Notes (Signed)
TIME SEEN: 3:07 AM  CHIEF COMPLAINT: Unwitnessed fall  HPI: Patient is a 78 year old female with history of COPD, hyperlipidemia, diabetes, dementia who presents to the emergency department from her nursing home after an unwitnessed fall.  Patient states that she accidentally fell out of bed and hit her head.  She denies any pain to me at this time.  Not on anticoagulation or antiplatelets.  ROS: Level 5 caveat secondary to dementia  PAST MEDICAL HISTORY/PAST SURGICAL HISTORY:  Past Medical History:  Diagnosis Date  . Abnormality of gait   . Anxiety state, unspecified   . Candidiasis of other urogenital sites   . Chronic airway obstruction, not elsewhere classified   . Chronic obstructive lung disease ([place])   . Depressive disorder, not elsewhere classified   . Esophageal reflux   . Hyperlipemia   . Lumbago   . Osteoporosis   . Spinal stenosis   . 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 Unspecified dementia without behavioral disturbance (HCC)     MEDICATIONS:  Prior to Admission medications   Medication Sig Start Date End Date Taking? Authorizing Provider  acetaminophen (TYLENOL) 325 MG tablet Take 325 mg by mouth every 4 (four) hours as needed (pain).     [provider]  aspirin EC 81 MG tablet Take 1 tablet (81 mg total) by mouth daily. Patient not taking: Reported on 09/27/2018 11/06/14   Gerlene Fee, NP  atorvastatin (LIPITOR) 10 MG tablet Take 10 mg by mouth daily.    [provider]  bisacodyl (DULCOLAX) 10 MG suppository Place 10 mg rectally daily as needed for moderate constipation.    [provider]  fluticasone (FLONASE) 50 MCG/ACT nasal spray Place 2 sprays into both nostrils at bedtime.    [provider]  gabapentin (NEURONTIN) 100 MG capsule Take 1 capsule (100 mg total) by mouth 3 (three) times daily. 06/20/14   Reed, Tiffany L, DO  Guaifenesin (MUCINEX MAXIMUM STRENGTH) 1200 MG TB12 Take 1 tablet by  mouth 2 (two) times daily.     [provider]  Hypromellose 0.4 % SOLN Place 2 drops into both eyes 2 (two) times daily.     [provider]  Insulin Detemir (LEVEMIR FLEXTOUCH) 100 UNIT/ML Pen Inject 30 Units into the skin at bedtime.  07/06/17   [provider]  linagliptin (TRADJENTA) 5 MG TABS tablet Take 5 mg by mouth daily. 01/21/18   [provider]  Melatonin 3 MG TABS Take 6 mg by mouth at bedtime.  04/14/18   [provider]  Memantine HCl-Donepezil HCl (NAMZARIC) 28-10 MG CP24 Take 1 capsule by mouth every evening. For dementia    [provider]  metFORMIN (GLUCOPHAGE) 500 MG tablet Take 500 mg by mouth daily with breakfast.    [provider]  Nutritional Supplements (NUTRITIONAL SUPPLEMENT PO) Low concentrated sweets diet - Regular texture, NAS    [provider]  ondansetron (ZOFRAN) 4 MG tablet Take 4 mg by mouth every 6 (six) hours as needed for nausea or vomiting. 04/24/18   [provider]  ranitidine (ZANTAC) 150 MG tablet Take 150 mg by mouth daily. In the morning and at bedtime For reflux    [provider]  umeclidinium bromide (INCRUSE ELLIPTA) 62.5 MCG/INH AEPB Inhale 1 puff into the lungs daily.     [provider]    ALLERGIES:  Allergies  Allergen Reactions  . Avelox [Moxifloxacin Hcl In Nacl] Hives  .  Codeine     Reports her throat swelled in the past but has tolerated since.  . Compazine [Prochlorperazine Edisylate] Nausea And Vomiting  . Morphine And Related     Patient doesn't recall.. no opiods  . Mycobacterium Other (See Comments)    On MAR  . Penicillins     Patient doesn't recall.  . Phenothiazines     Patient doesn't recall  . Prednisone     Insomnia, confusion.  Marland Kitchen Reglan [Metoclopramide]     Patient doesn't recall "but it was awful"  . Sulfonamide Derivatives Hives  . Tuberculin Tests Other (See Comments)    On MAR    SOCIAL HISTORY:  Social  History   Tobacco Use  . Smoking status: Former Smoker    Packs/day: 1.00    Years: 20.00    Pack years: 20.00  . Smokeless tobacco: Never Used  Substance Use Topics  . Alcohol use: No    FAMILY HISTORY: Family History  Family history unknown: Yes    EXAM: BP 102/79   Pulse 94   Temp 98.2 F (36.8 C) (Oral)   Resp 18   SpO2 96%  CONSTITUTIONAL: Alert and oriented to person and place but not time and responds appropriately to questions. Well-appearing; well-nourished; GCS 15 HEAD: Normocephalic; atraumatic EYES: Conjunctivae clear, PERRL, EOMI ENT: normal nose; no rhinorrhea; moist mucous membranes; pharynx without lesions noted; no dental injury; no septal hematoma NECK: Supple, no meningismus, no LAD; no midline spinal tenderness, step-off or deformity; trachea midline CARD: RRR; S1 and S2 appreciated; no murmurs, no clicks, no rubs, no gallops RESP: Normal chest excursion without splinting or tachypnea; breath sounds clear and equal bilaterally; no wheezes, no rhonchi, no rales; no hypoxia or respiratory distress CHEST:  chest wall stable, no crepitus or ecchymosis or deformity, nontender to palpation; no flail chest ABD/GI: Normal bowel sounds; non-distended; soft, non-tender, no rebound, no guarding; no ecchymosis or other lesions noted PELVIS:  stable, nontender to palpation BACK:  The back appears normal and is non-tender to palpation, there is no CVA tenderness; no midline spinal tenderness, step-off or deformity EXT: Normal ROM in all joints; non-tender to palpation; no edema; normal capillary refill; no cyanosis, no bony tenderness or bony deformity of patient's extremities, no joint effusion, compartments are soft, extremities are warm and well-perfused, no ecchymosis SKIN: Normal color for age and race; warm NEURO: Moves all extremities equally, reports normal sensation diffusely, normal speech, no facial asymmetry PSYCH: The patient's mood and manner are appropriate.  Grooming and personal hygiene are appropriate.  MEDICAL DECISION MAKING: Patient here after unwitnessed fall.  She states that she rolled out of bed.  Neurologically intact and hemodynamically stable.  She denies any complaints at this time.  She states she did hit her head.  Not on anticoagulation.  Will obtain CT head and cervical spine.  ED PROGRESS: CTs unremarkable.  Will discharge back to nursing facility.   At this time, I do not feel there is any life-threatening condition present. I have reviewed and discussed all results (EKG, imaging, lab, urine as appropriate) and exam findings with patient/family. I have reviewed nursing notes and appropriate previous records.  I feel the patient is safe to be discharged home without further emergent workup and can continue workup as an outpatient as needed. Discussed usual and customary return precautions. Patient/family verbalize understanding and are comfortable with this plan.  Outpatient follow-up has been provided as needed. All questions have been answered.  Kenyatte Chatmon, Delice Bison, DO 10/19/18 [phone]

## 2018-10-24 DIAGNOSIS — F0390 Unspecified dementia without behavioral disturbance: Secondary | ICD-10-CM | POA: Diagnosis not present

## 2018-10-24 DIAGNOSIS — R112 Nausea with vomiting, unspecified: Secondary | ICD-10-CM | POA: Diagnosis not present

## 2018-10-24 DIAGNOSIS — G4700 Insomnia, unspecified: Secondary | ICD-10-CM | POA: Diagnosis not present

## 2018-10-24 DIAGNOSIS — B351 Tinea unguium: Secondary | ICD-10-CM | POA: Diagnosis not present

## 2018-10-24 DIAGNOSIS — J449 Chronic obstructive pulmonary disease, unspecified: Secondary | ICD-10-CM | POA: Diagnosis not present

## 2018-10-24 DIAGNOSIS — E1151 Type 2 diabetes mellitus with diabetic peripheral angiopathy without gangrene: Secondary | ICD-10-CM | POA: Diagnosis not present

## 2018-11-03 DIAGNOSIS — J449 Chronic obstructive pulmonary disease, unspecified: Secondary | ICD-10-CM | POA: Diagnosis not present

## 2018-11-03 DIAGNOSIS — K219 Gastro-esophageal reflux disease without esophagitis: Secondary | ICD-10-CM | POA: Diagnosis not present

## 2018-11-03 DIAGNOSIS — F0390 Unspecified dementia without behavioral disturbance: Secondary | ICD-10-CM | POA: Diagnosis not present

## 2018-11-03 DIAGNOSIS — G4700 Insomnia, unspecified: Secondary | ICD-10-CM | POA: Diagnosis not present

## 2018-11-04 DIAGNOSIS — Z79899 Other long term (current) drug therapy: Secondary | ICD-10-CM | POA: Diagnosis not present

## 2018-11-04 DIAGNOSIS — R109 Unspecified abdominal pain: Secondary | ICD-10-CM | POA: Diagnosis not present

## 2018-11-04 DIAGNOSIS — D649 Anemia, unspecified: Secondary | ICD-10-CM | POA: Diagnosis not present

## 2018-11-08 DIAGNOSIS — F0390 Unspecified dementia without behavioral disturbance: Secondary | ICD-10-CM | POA: Diagnosis not present

## 2018-11-08 DIAGNOSIS — F5105 Insomnia due to other mental disorder: Secondary | ICD-10-CM | POA: Diagnosis not present

## 2018-11-08 DIAGNOSIS — F331 Major depressive disorder, recurrent, moderate: Secondary | ICD-10-CM | POA: Diagnosis not present

## 2018-11-09 DIAGNOSIS — J449 Chronic obstructive pulmonary disease, unspecified: Secondary | ICD-10-CM | POA: Diagnosis not present

## 2018-11-09 DIAGNOSIS — F0390 Unspecified dementia without behavioral disturbance: Secondary | ICD-10-CM | POA: Diagnosis not present

## 2018-11-09 DIAGNOSIS — R110 Nausea: Secondary | ICD-10-CM | POA: Diagnosis not present

## 2018-11-09 DIAGNOSIS — G4700 Insomnia, unspecified: Secondary | ICD-10-CM | POA: Diagnosis not present

## 2018-11-22 DIAGNOSIS — K219 Gastro-esophageal reflux disease without esophagitis: Secondary | ICD-10-CM | POA: Diagnosis not present

## 2018-11-22 DIAGNOSIS — G4700 Insomnia, unspecified: Secondary | ICD-10-CM | POA: Diagnosis not present

## 2018-11-22 DIAGNOSIS — J449 Chronic obstructive pulmonary disease, unspecified: Secondary | ICD-10-CM | POA: Diagnosis not present

## 2018-11-22 DIAGNOSIS — F0390 Unspecified dementia without behavioral disturbance: Secondary | ICD-10-CM | POA: Diagnosis not present

## 2018-12-01 DIAGNOSIS — G4700 Insomnia, unspecified: Secondary | ICD-10-CM | POA: Diagnosis not present

## 2018-12-01 DIAGNOSIS — J449 Chronic obstructive pulmonary disease, unspecified: Secondary | ICD-10-CM | POA: Diagnosis not present

## 2018-12-01 DIAGNOSIS — F5105 Insomnia due to other mental disorder: Secondary | ICD-10-CM | POA: Diagnosis not present

## 2018-12-01 DIAGNOSIS — K219 Gastro-esophageal reflux disease without esophagitis: Secondary | ICD-10-CM | POA: Diagnosis not present

## 2018-12-01 DIAGNOSIS — F331 Major depressive disorder, recurrent, moderate: Secondary | ICD-10-CM | POA: Diagnosis not present

## 2018-12-01 DIAGNOSIS — F0390 Unspecified dementia without behavioral disturbance: Secondary | ICD-10-CM | POA: Diagnosis not present

## 2018-12-23 DIAGNOSIS — E119 Type 2 diabetes mellitus without complications: Secondary | ICD-10-CM | POA: Diagnosis not present

## 2018-12-23 DIAGNOSIS — G4700 Insomnia, unspecified: Secondary | ICD-10-CM | POA: Diagnosis not present

## 2018-12-23 DIAGNOSIS — F0390 Unspecified dementia without behavioral disturbance: Secondary | ICD-10-CM | POA: Diagnosis not present

## 2018-12-23 DIAGNOSIS — J449 Chronic obstructive pulmonary disease, unspecified: Secondary | ICD-10-CM | POA: Diagnosis not present

## 2018-12-28 DIAGNOSIS — E119 Type 2 diabetes mellitus without complications: Secondary | ICD-10-CM | POA: Diagnosis not present

## 2018-12-28 DIAGNOSIS — D649 Anemia, unspecified: Secondary | ICD-10-CM | POA: Diagnosis not present

## 2018-12-28 DIAGNOSIS — E785 Hyperlipidemia, unspecified: Secondary | ICD-10-CM | POA: Diagnosis not present

## 2018-12-28 DIAGNOSIS — I10 Essential (primary) hypertension: Secondary | ICD-10-CM | POA: Diagnosis not present

## 2018-12-28 DIAGNOSIS — E559 Vitamin D deficiency, unspecified: Secondary | ICD-10-CM | POA: Diagnosis not present

## 2018-12-28 DIAGNOSIS — E039 Hypothyroidism, unspecified: Secondary | ICD-10-CM | POA: Diagnosis not present

## 2018-12-29 DIAGNOSIS — E119 Type 2 diabetes mellitus without complications: Secondary | ICD-10-CM | POA: Diagnosis not present

## 2018-12-29 DIAGNOSIS — F0390 Unspecified dementia without behavioral disturbance: Secondary | ICD-10-CM | POA: Diagnosis not present

## 2018-12-29 DIAGNOSIS — F331 Major depressive disorder, recurrent, moderate: Secondary | ICD-10-CM | POA: Diagnosis not present

## 2018-12-29 DIAGNOSIS — J449 Chronic obstructive pulmonary disease, unspecified: Secondary | ICD-10-CM | POA: Diagnosis not present

## 2018-12-29 DIAGNOSIS — G4700 Insomnia, unspecified: Secondary | ICD-10-CM | POA: Diagnosis not present

## 2018-12-29 DIAGNOSIS — F5105 Insomnia due to other mental disorder: Secondary | ICD-10-CM | POA: Diagnosis not present

## 2019-01-07 DIAGNOSIS — F419 Anxiety disorder, unspecified: Secondary | ICD-10-CM | POA: Diagnosis not present

## 2019-01-14 DIAGNOSIS — F419 Anxiety disorder, unspecified: Secondary | ICD-10-CM | POA: Diagnosis not present

## 2019-01-20 DIAGNOSIS — E119 Type 2 diabetes mellitus without complications: Secondary | ICD-10-CM | POA: Diagnosis not present

## 2019-01-20 DIAGNOSIS — G4700 Insomnia, unspecified: Secondary | ICD-10-CM | POA: Diagnosis not present

## 2019-01-20 DIAGNOSIS — J449 Chronic obstructive pulmonary disease, unspecified: Secondary | ICD-10-CM | POA: Diagnosis not present

## 2019-01-20 DIAGNOSIS — F0390 Unspecified dementia without behavioral disturbance: Secondary | ICD-10-CM | POA: Diagnosis not present

## 2019-01-23 DIAGNOSIS — F5105 Insomnia due to other mental disorder: Secondary | ICD-10-CM | POA: Diagnosis not present

## 2019-01-23 DIAGNOSIS — F0390 Unspecified dementia without behavioral disturbance: Secondary | ICD-10-CM | POA: Diagnosis not present

## 2019-01-23 DIAGNOSIS — F331 Major depressive disorder, recurrent, moderate: Secondary | ICD-10-CM | POA: Diagnosis not present

## 2019-01-25 DIAGNOSIS — R2689 Other abnormalities of gait and mobility: Secondary | ICD-10-CM | POA: Diagnosis not present

## 2019-01-25 DIAGNOSIS — E11628 Type 2 diabetes mellitus with other skin complications: Secondary | ICD-10-CM | POA: Diagnosis not present

## 2019-01-25 DIAGNOSIS — M6281 Muscle weakness (generalized): Secondary | ICD-10-CM | POA: Diagnosis not present

## 2019-01-25 DIAGNOSIS — F0390 Unspecified dementia without behavioral disturbance: Secondary | ICD-10-CM | POA: Diagnosis not present

## 2019-01-26 DIAGNOSIS — G4700 Insomnia, unspecified: Secondary | ICD-10-CM | POA: Diagnosis not present

## 2019-01-26 DIAGNOSIS — J449 Chronic obstructive pulmonary disease, unspecified: Secondary | ICD-10-CM | POA: Diagnosis not present

## 2019-01-26 DIAGNOSIS — K219 Gastro-esophageal reflux disease without esophagitis: Secondary | ICD-10-CM | POA: Diagnosis not present

## 2019-01-26 DIAGNOSIS — E119 Type 2 diabetes mellitus without complications: Secondary | ICD-10-CM | POA: Diagnosis not present

## 2019-01-27 DIAGNOSIS — F0390 Unspecified dementia without behavioral disturbance: Secondary | ICD-10-CM | POA: Diagnosis not present

## 2019-01-27 DIAGNOSIS — R2689 Other abnormalities of gait and mobility: Secondary | ICD-10-CM | POA: Diagnosis not present

## 2019-01-27 DIAGNOSIS — E11628 Type 2 diabetes mellitus with other skin complications: Secondary | ICD-10-CM | POA: Diagnosis not present

## 2019-01-27 DIAGNOSIS — M6281 Muscle weakness (generalized): Secondary | ICD-10-CM | POA: Diagnosis not present

## 2019-01-29 DIAGNOSIS — E11628 Type 2 diabetes mellitus with other skin complications: Secondary | ICD-10-CM | POA: Diagnosis not present

## 2019-01-29 DIAGNOSIS — F0390 Unspecified dementia without behavioral disturbance: Secondary | ICD-10-CM | POA: Diagnosis not present

## 2019-01-29 DIAGNOSIS — M6281 Muscle weakness (generalized): Secondary | ICD-10-CM | POA: Diagnosis not present

## 2019-01-29 DIAGNOSIS — R2689 Other abnormalities of gait and mobility: Secondary | ICD-10-CM | POA: Diagnosis not present

## 2019-01-30 DIAGNOSIS — F0390 Unspecified dementia without behavioral disturbance: Secondary | ICD-10-CM | POA: Diagnosis not present

## 2019-01-30 DIAGNOSIS — E11628 Type 2 diabetes mellitus with other skin complications: Secondary | ICD-10-CM | POA: Diagnosis not present

## 2019-01-30 DIAGNOSIS — M6281 Muscle weakness (generalized): Secondary | ICD-10-CM | POA: Diagnosis not present

## 2019-01-30 DIAGNOSIS — R2689 Other abnormalities of gait and mobility: Secondary | ICD-10-CM | POA: Diagnosis not present

## 2019-01-31 DIAGNOSIS — E11628 Type 2 diabetes mellitus with other skin complications: Secondary | ICD-10-CM | POA: Diagnosis not present

## 2019-01-31 DIAGNOSIS — F0390 Unspecified dementia without behavioral disturbance: Secondary | ICD-10-CM | POA: Diagnosis not present

## 2019-01-31 DIAGNOSIS — M6281 Muscle weakness (generalized): Secondary | ICD-10-CM | POA: Diagnosis not present

## 2019-01-31 DIAGNOSIS — R2689 Other abnormalities of gait and mobility: Secondary | ICD-10-CM | POA: Diagnosis not present

## 2019-02-01 DIAGNOSIS — E11628 Type 2 diabetes mellitus with other skin complications: Secondary | ICD-10-CM | POA: Diagnosis not present

## 2019-02-01 DIAGNOSIS — R2689 Other abnormalities of gait and mobility: Secondary | ICD-10-CM | POA: Diagnosis not present

## 2019-02-01 DIAGNOSIS — F0390 Unspecified dementia without behavioral disturbance: Secondary | ICD-10-CM | POA: Diagnosis not present

## 2019-02-01 DIAGNOSIS — M6281 Muscle weakness (generalized): Secondary | ICD-10-CM | POA: Diagnosis not present

## 2019-02-02 DIAGNOSIS — M6281 Muscle weakness (generalized): Secondary | ICD-10-CM | POA: Diagnosis not present

## 2019-02-02 DIAGNOSIS — R2689 Other abnormalities of gait and mobility: Secondary | ICD-10-CM | POA: Diagnosis not present

## 2019-02-02 DIAGNOSIS — E11628 Type 2 diabetes mellitus with other skin complications: Secondary | ICD-10-CM | POA: Diagnosis not present

## 2019-02-02 DIAGNOSIS — F0390 Unspecified dementia without behavioral disturbance: Secondary | ICD-10-CM | POA: Diagnosis not present

## 2019-02-06 DIAGNOSIS — J449 Chronic obstructive pulmonary disease, unspecified: Secondary | ICD-10-CM | POA: Diagnosis not present

## 2019-02-06 DIAGNOSIS — E119 Type 2 diabetes mellitus without complications: Secondary | ICD-10-CM | POA: Diagnosis not present

## 2019-02-06 DIAGNOSIS — E11628 Type 2 diabetes mellitus with other skin complications: Secondary | ICD-10-CM | POA: Diagnosis not present

## 2019-02-06 DIAGNOSIS — F329 Major depressive disorder, single episode, unspecified: Secondary | ICD-10-CM | POA: Diagnosis not present

## 2019-02-06 DIAGNOSIS — M6281 Muscle weakness (generalized): Secondary | ICD-10-CM | POA: Diagnosis not present

## 2019-02-06 DIAGNOSIS — F0390 Unspecified dementia without behavioral disturbance: Secondary | ICD-10-CM | POA: Diagnosis not present

## 2019-02-06 DIAGNOSIS — E785 Hyperlipidemia, unspecified: Secondary | ICD-10-CM | POA: Diagnosis not present

## 2019-02-06 DIAGNOSIS — R2689 Other abnormalities of gait and mobility: Secondary | ICD-10-CM | POA: Diagnosis not present

## 2019-02-07 DIAGNOSIS — R2689 Other abnormalities of gait and mobility: Secondary | ICD-10-CM | POA: Diagnosis not present

## 2019-02-07 DIAGNOSIS — F0390 Unspecified dementia without behavioral disturbance: Secondary | ICD-10-CM | POA: Diagnosis not present

## 2019-02-07 DIAGNOSIS — E11628 Type 2 diabetes mellitus with other skin complications: Secondary | ICD-10-CM | POA: Diagnosis not present

## 2019-02-07 DIAGNOSIS — M6281 Muscle weakness (generalized): Secondary | ICD-10-CM | POA: Diagnosis not present

## 2019-02-08 DIAGNOSIS — R2689 Other abnormalities of gait and mobility: Secondary | ICD-10-CM | POA: Diagnosis not present

## 2019-02-08 DIAGNOSIS — M6281 Muscle weakness (generalized): Secondary | ICD-10-CM | POA: Diagnosis not present

## 2019-02-08 DIAGNOSIS — F0390 Unspecified dementia without behavioral disturbance: Secondary | ICD-10-CM | POA: Diagnosis not present

## 2019-02-08 DIAGNOSIS — E11628 Type 2 diabetes mellitus with other skin complications: Secondary | ICD-10-CM | POA: Diagnosis not present

## 2019-02-09 DIAGNOSIS — M6281 Muscle weakness (generalized): Secondary | ICD-10-CM | POA: Diagnosis not present

## 2019-02-09 DIAGNOSIS — F0390 Unspecified dementia without behavioral disturbance: Secondary | ICD-10-CM | POA: Diagnosis not present

## 2019-02-09 DIAGNOSIS — E11628 Type 2 diabetes mellitus with other skin complications: Secondary | ICD-10-CM | POA: Diagnosis not present

## 2019-02-09 DIAGNOSIS — R2689 Other abnormalities of gait and mobility: Secondary | ICD-10-CM | POA: Diagnosis not present

## 2019-02-13 DIAGNOSIS — R2689 Other abnormalities of gait and mobility: Secondary | ICD-10-CM | POA: Diagnosis not present

## 2019-02-13 DIAGNOSIS — M6281 Muscle weakness (generalized): Secondary | ICD-10-CM | POA: Diagnosis not present

## 2019-02-13 DIAGNOSIS — E11628 Type 2 diabetes mellitus with other skin complications: Secondary | ICD-10-CM | POA: Diagnosis not present

## 2019-02-13 DIAGNOSIS — F0390 Unspecified dementia without behavioral disturbance: Secondary | ICD-10-CM | POA: Diagnosis not present

## 2019-02-14 DIAGNOSIS — E11628 Type 2 diabetes mellitus with other skin complications: Secondary | ICD-10-CM | POA: Diagnosis not present

## 2019-02-14 DIAGNOSIS — R2689 Other abnormalities of gait and mobility: Secondary | ICD-10-CM | POA: Diagnosis not present

## 2019-02-14 DIAGNOSIS — F0390 Unspecified dementia without behavioral disturbance: Secondary | ICD-10-CM | POA: Diagnosis not present

## 2019-02-14 DIAGNOSIS — M6281 Muscle weakness (generalized): Secondary | ICD-10-CM | POA: Diagnosis not present

## 2019-02-15 DIAGNOSIS — F0390 Unspecified dementia without behavioral disturbance: Secondary | ICD-10-CM | POA: Diagnosis not present

## 2019-02-15 DIAGNOSIS — R2689 Other abnormalities of gait and mobility: Secondary | ICD-10-CM | POA: Diagnosis not present

## 2019-02-15 DIAGNOSIS — E11628 Type 2 diabetes mellitus with other skin complications: Secondary | ICD-10-CM | POA: Diagnosis not present

## 2019-02-15 DIAGNOSIS — M6281 Muscle weakness (generalized): Secondary | ICD-10-CM | POA: Diagnosis not present

## 2019-02-16 DIAGNOSIS — M6281 Muscle weakness (generalized): Secondary | ICD-10-CM | POA: Diagnosis not present

## 2019-02-16 DIAGNOSIS — E11628 Type 2 diabetes mellitus with other skin complications: Secondary | ICD-10-CM | POA: Diagnosis not present

## 2019-02-16 DIAGNOSIS — F0390 Unspecified dementia without behavioral disturbance: Secondary | ICD-10-CM | POA: Diagnosis not present

## 2019-02-16 DIAGNOSIS — R2689 Other abnormalities of gait and mobility: Secondary | ICD-10-CM | POA: Diagnosis not present

## 2019-02-17 DIAGNOSIS — J449 Chronic obstructive pulmonary disease, unspecified: Secondary | ICD-10-CM | POA: Diagnosis not present

## 2019-02-17 DIAGNOSIS — G4700 Insomnia, unspecified: Secondary | ICD-10-CM | POA: Diagnosis not present

## 2019-02-17 DIAGNOSIS — E119 Type 2 diabetes mellitus without complications: Secondary | ICD-10-CM | POA: Diagnosis not present

## 2019-02-17 DIAGNOSIS — F0390 Unspecified dementia without behavioral disturbance: Secondary | ICD-10-CM | POA: Diagnosis not present

## 2019-02-17 DIAGNOSIS — R2689 Other abnormalities of gait and mobility: Secondary | ICD-10-CM | POA: Diagnosis not present

## 2019-02-17 DIAGNOSIS — K219 Gastro-esophageal reflux disease without esophagitis: Secondary | ICD-10-CM | POA: Diagnosis not present

## 2019-02-17 DIAGNOSIS — M6281 Muscle weakness (generalized): Secondary | ICD-10-CM | POA: Diagnosis not present

## 2019-02-17 DIAGNOSIS — E11628 Type 2 diabetes mellitus with other skin complications: Secondary | ICD-10-CM | POA: Diagnosis not present

## 2019-02-20 DIAGNOSIS — F0390 Unspecified dementia without behavioral disturbance: Secondary | ICD-10-CM | POA: Diagnosis not present

## 2019-02-20 DIAGNOSIS — E11628 Type 2 diabetes mellitus with other skin complications: Secondary | ICD-10-CM | POA: Diagnosis not present

## 2019-02-20 DIAGNOSIS — R2689 Other abnormalities of gait and mobility: Secondary | ICD-10-CM | POA: Diagnosis not present

## 2019-02-20 DIAGNOSIS — M6281 Muscle weakness (generalized): Secondary | ICD-10-CM | POA: Diagnosis not present

## 2019-02-21 DIAGNOSIS — E11628 Type 2 diabetes mellitus with other skin complications: Secondary | ICD-10-CM | POA: Diagnosis not present

## 2019-02-21 DIAGNOSIS — R2689 Other abnormalities of gait and mobility: Secondary | ICD-10-CM | POA: Diagnosis not present

## 2019-02-21 DIAGNOSIS — F0390 Unspecified dementia without behavioral disturbance: Secondary | ICD-10-CM | POA: Diagnosis not present

## 2019-02-21 DIAGNOSIS — M6281 Muscle weakness (generalized): Secondary | ICD-10-CM | POA: Diagnosis not present

## 2019-02-22 DIAGNOSIS — R2689 Other abnormalities of gait and mobility: Secondary | ICD-10-CM | POA: Diagnosis not present

## 2019-02-22 DIAGNOSIS — G4700 Insomnia, unspecified: Secondary | ICD-10-CM | POA: Diagnosis not present

## 2019-02-22 DIAGNOSIS — F0390 Unspecified dementia without behavioral disturbance: Secondary | ICD-10-CM | POA: Diagnosis not present

## 2019-02-22 DIAGNOSIS — G301 Alzheimer's disease with late onset: Secondary | ICD-10-CM | POA: Diagnosis not present

## 2019-02-22 DIAGNOSIS — M6281 Muscle weakness (generalized): Secondary | ICD-10-CM | POA: Diagnosis not present

## 2019-02-22 DIAGNOSIS — F331 Major depressive disorder, recurrent, moderate: Secondary | ICD-10-CM | POA: Diagnosis not present

## 2019-02-22 DIAGNOSIS — E11628 Type 2 diabetes mellitus with other skin complications: Secondary | ICD-10-CM | POA: Diagnosis not present

## 2019-02-23 DIAGNOSIS — K219 Gastro-esophageal reflux disease without esophagitis: Secondary | ICD-10-CM | POA: Diagnosis not present

## 2019-02-23 DIAGNOSIS — G4700 Insomnia, unspecified: Secondary | ICD-10-CM | POA: Diagnosis not present

## 2019-02-23 DIAGNOSIS — E119 Type 2 diabetes mellitus without complications: Secondary | ICD-10-CM | POA: Diagnosis not present

## 2019-02-23 DIAGNOSIS — J449 Chronic obstructive pulmonary disease, unspecified: Secondary | ICD-10-CM | POA: Diagnosis not present

## 2019-02-24 DIAGNOSIS — F70 Mild intellectual disabilities: Secondary | ICD-10-CM | POA: Diagnosis not present

## 2019-02-24 DIAGNOSIS — F331 Major depressive disorder, recurrent, moderate: Secondary | ICD-10-CM | POA: Diagnosis not present

## 2019-03-09 DIAGNOSIS — B342 Coronavirus infection, unspecified: Secondary | ICD-10-CM | POA: Diagnosis not present

## 2019-03-10 DIAGNOSIS — F329 Major depressive disorder, single episode, unspecified: Secondary | ICD-10-CM | POA: Diagnosis not present

## 2019-03-10 DIAGNOSIS — E119 Type 2 diabetes mellitus without complications: Secondary | ICD-10-CM | POA: Diagnosis not present

## 2019-03-10 DIAGNOSIS — J449 Chronic obstructive pulmonary disease, unspecified: Secondary | ICD-10-CM | POA: Diagnosis not present

## 2019-03-10 DIAGNOSIS — E785 Hyperlipidemia, unspecified: Secondary | ICD-10-CM | POA: Diagnosis not present

## 2019-03-17 DIAGNOSIS — K219 Gastro-esophageal reflux disease without esophagitis: Secondary | ICD-10-CM | POA: Diagnosis not present

## 2019-03-17 DIAGNOSIS — E119 Type 2 diabetes mellitus without complications: Secondary | ICD-10-CM | POA: Diagnosis not present

## 2019-03-17 DIAGNOSIS — J449 Chronic obstructive pulmonary disease, unspecified: Secondary | ICD-10-CM | POA: Diagnosis not present

## 2019-03-17 DIAGNOSIS — G4700 Insomnia, unspecified: Secondary | ICD-10-CM | POA: Diagnosis not present

## 2019-03-23 DIAGNOSIS — B349 Viral infection, unspecified: Secondary | ICD-10-CM | POA: Diagnosis not present

## 2019-03-23 DIAGNOSIS — G4700 Insomnia, unspecified: Secondary | ICD-10-CM | POA: Diagnosis not present

## 2019-03-23 DIAGNOSIS — K219 Gastro-esophageal reflux disease without esophagitis: Secondary | ICD-10-CM | POA: Diagnosis not present

## 2019-03-23 DIAGNOSIS — J449 Chronic obstructive pulmonary disease, unspecified: Secondary | ICD-10-CM | POA: Diagnosis not present

## 2019-03-23 DIAGNOSIS — J984 Other disorders of lung: Secondary | ICD-10-CM | POA: Diagnosis not present

## 2019-03-24 ENCOUNTER — Other Ambulatory Visit (HOSPITAL_COMMUNITY): Payer: Self-pay | Admitting: Internal Medicine

## 2019-03-24 ENCOUNTER — Other Ambulatory Visit (HOSPITAL_COMMUNITY): Payer: Self-pay | Admitting: Family Medicine

## 2019-03-24 DIAGNOSIS — R9389 Abnormal findings on diagnostic imaging of other specified body structures: Secondary | ICD-10-CM

## 2019-03-24 DIAGNOSIS — Z20828 Contact with and (suspected) exposure to other viral communicable diseases: Secondary | ICD-10-CM | POA: Diagnosis not present

## 2019-03-24 DIAGNOSIS — J449 Chronic obstructive pulmonary disease, unspecified: Secondary | ICD-10-CM | POA: Diagnosis not present

## 2019-03-24 DIAGNOSIS — IMO0002 Reserved for concepts with insufficient information to code with codable children: Secondary | ICD-10-CM

## 2019-03-24 DIAGNOSIS — R6889 Other general symptoms and signs: Secondary | ICD-10-CM | POA: Diagnosis not present

## 2019-03-24 DIAGNOSIS — K219 Gastro-esophageal reflux disease without esophagitis: Secondary | ICD-10-CM | POA: Diagnosis not present

## 2019-03-24 DIAGNOSIS — R918 Other nonspecific abnormal finding of lung field: Secondary | ICD-10-CM | POA: Diagnosis not present

## 2019-03-25 DIAGNOSIS — Z79899 Other long term (current) drug therapy: Secondary | ICD-10-CM | POA: Diagnosis not present

## 2019-03-30 DIAGNOSIS — F0390 Unspecified dementia without behavioral disturbance: Secondary | ICD-10-CM | POA: Diagnosis not present

## 2019-03-30 DIAGNOSIS — G4700 Insomnia, unspecified: Secondary | ICD-10-CM | POA: Diagnosis not present

## 2019-03-30 DIAGNOSIS — F331 Major depressive disorder, recurrent, moderate: Secondary | ICD-10-CM | POA: Diagnosis not present

## 2019-03-30 DIAGNOSIS — J449 Chronic obstructive pulmonary disease, unspecified: Secondary | ICD-10-CM | POA: Diagnosis not present

## 2019-03-30 DIAGNOSIS — G301 Alzheimer's disease with late onset: Secondary | ICD-10-CM | POA: Diagnosis not present

## 2019-03-30 DIAGNOSIS — F70 Mild intellectual disabilities: Secondary | ICD-10-CM | POA: Diagnosis not present

## 2019-03-30 DIAGNOSIS — J3089 Other allergic rhinitis: Secondary | ICD-10-CM | POA: Diagnosis not present

## 2019-03-31 ENCOUNTER — Encounter (HOSPITAL_COMMUNITY): Payer: Self-pay

## 2019-03-31 ENCOUNTER — Other Ambulatory Visit: Payer: Self-pay

## 2019-03-31 ENCOUNTER — Ambulatory Visit (HOSPITAL_COMMUNITY)
Admission: RE | Admit: 2019-03-31 | Discharge: 2019-03-31 | Disposition: A | Discharge status: Home / Self Care | Payer: Medicare Other | Source: Ambulatory Visit | Attending: Family Medicine | Admitting: Family Medicine

## 2019-03-31 DIAGNOSIS — F329 Major depressive disorder, single episode, unspecified: Secondary | ICD-10-CM | POA: Diagnosis not present

## 2019-03-31 DIAGNOSIS — IMO0002 Reserved for concepts with insufficient information to code with codable children: Secondary | ICD-10-CM

## 2019-03-31 DIAGNOSIS — J449 Chronic obstructive pulmonary disease, unspecified: Secondary | ICD-10-CM | POA: Diagnosis not present

## 2019-03-31 DIAGNOSIS — E119 Type 2 diabetes mellitus without complications: Secondary | ICD-10-CM | POA: Diagnosis not present

## 2019-03-31 DIAGNOSIS — R9389 Abnormal findings on diagnostic imaging of other specified body structures: Secondary | ICD-10-CM | POA: Diagnosis not present

## 2019-03-31 DIAGNOSIS — R229 Localized swelling, mass and lump, unspecified: Secondary | ICD-10-CM | POA: Insufficient documentation

## 2019-03-31 DIAGNOSIS — E785 Hyperlipidemia, unspecified: Secondary | ICD-10-CM | POA: Diagnosis not present

## 2019-03-31 DIAGNOSIS — R918 Other nonspecific abnormal finding of lung field: Secondary | ICD-10-CM | POA: Diagnosis not present

## 2019-03-31 MED ORDER — IOHEXOL 300 MG/ML  SOLN
100.0000 mL | Freq: Once | INTRAMUSCULAR | Status: AC
  Administered 2019-03-31: 75 mL via INTRAVENOUS

## 2019-04-04 DIAGNOSIS — R918 Other nonspecific abnormal finding of lung field: Secondary | ICD-10-CM | POA: Diagnosis not present

## 2019-04-04 DIAGNOSIS — K219 Gastro-esophageal reflux disease without esophagitis: Secondary | ICD-10-CM | POA: Diagnosis not present

## 2019-04-04 DIAGNOSIS — D491 Neoplasm of unspecified behavior of respiratory system: Secondary | ICD-10-CM | POA: Diagnosis not present

## 2019-04-04 DIAGNOSIS — J449 Chronic obstructive pulmonary disease, unspecified: Secondary | ICD-10-CM | POA: Diagnosis not present

## 2019-04-14 DIAGNOSIS — R918 Other nonspecific abnormal finding of lung field: Secondary | ICD-10-CM | POA: Diagnosis not present

## 2019-04-14 DIAGNOSIS — D491 Neoplasm of unspecified behavior of respiratory system: Secondary | ICD-10-CM | POA: Diagnosis not present

## 2019-04-14 DIAGNOSIS — G8929 Other chronic pain: Secondary | ICD-10-CM | POA: Diagnosis not present

## 2019-04-14 DIAGNOSIS — K219 Gastro-esophageal reflux disease without esophagitis: Secondary | ICD-10-CM | POA: Diagnosis not present

## 2019-04-20 DIAGNOSIS — R918 Other nonspecific abnormal finding of lung field: Secondary | ICD-10-CM | POA: Diagnosis not present

## 2019-04-20 DIAGNOSIS — G4700 Insomnia, unspecified: Secondary | ICD-10-CM | POA: Diagnosis not present

## 2019-04-20 DIAGNOSIS — G8929 Other chronic pain: Secondary | ICD-10-CM | POA: Diagnosis not present

## 2019-04-20 DIAGNOSIS — D491 Neoplasm of unspecified behavior of respiratory system: Secondary | ICD-10-CM | POA: Diagnosis not present

## 2019-05-01 DIAGNOSIS — Z20828 Contact with and (suspected) exposure to other viral communicable diseases: Secondary | ICD-10-CM | POA: Diagnosis not present

## 2019-05-04 DIAGNOSIS — G4701 Insomnia due to medical condition: Secondary | ICD-10-CM | POA: Diagnosis not present

## 2019-05-04 DIAGNOSIS — F0281 Dementia in other diseases classified elsewhere with behavioral disturbance: Secondary | ICD-10-CM | POA: Diagnosis not present

## 2019-05-04 DIAGNOSIS — F331 Major depressive disorder, recurrent, moderate: Secondary | ICD-10-CM | POA: Diagnosis not present

## 2019-05-04 DIAGNOSIS — G301 Alzheimer's disease with late onset: Secondary | ICD-10-CM | POA: Diagnosis not present

## 2019-05-05 DIAGNOSIS — F329 Major depressive disorder, single episode, unspecified: Secondary | ICD-10-CM | POA: Diagnosis not present

## 2019-05-05 DIAGNOSIS — E785 Hyperlipidemia, unspecified: Secondary | ICD-10-CM | POA: Diagnosis not present

## 2019-05-05 DIAGNOSIS — J449 Chronic obstructive pulmonary disease, unspecified: Secondary | ICD-10-CM | POA: Diagnosis not present

## 2019-05-05 DIAGNOSIS — E119 Type 2 diabetes mellitus without complications: Secondary | ICD-10-CM | POA: Diagnosis not present

## 2019-05-12 DIAGNOSIS — R918 Other nonspecific abnormal finding of lung field: Secondary | ICD-10-CM | POA: Diagnosis not present

## 2019-05-12 DIAGNOSIS — D491 Neoplasm of unspecified behavior of respiratory system: Secondary | ICD-10-CM | POA: Diagnosis not present

## 2019-05-12 DIAGNOSIS — K219 Gastro-esophageal reflux disease without esophagitis: Secondary | ICD-10-CM | POA: Diagnosis not present

## 2019-05-12 DIAGNOSIS — G8929 Other chronic pain: Secondary | ICD-10-CM | POA: Diagnosis not present

## 2019-05-18 DIAGNOSIS — H40023 Open angle with borderline findings, high risk, bilateral: Secondary | ICD-10-CM | POA: Diagnosis not present

## 2019-05-18 DIAGNOSIS — E113293 Type 2 diabetes mellitus with mild nonproliferative diabetic retinopathy without macular edema, bilateral: Secondary | ICD-10-CM | POA: Diagnosis not present

## 2019-05-18 DIAGNOSIS — G4700 Insomnia, unspecified: Secondary | ICD-10-CM | POA: Diagnosis not present

## 2019-05-18 DIAGNOSIS — K219 Gastro-esophageal reflux disease without esophagitis: Secondary | ICD-10-CM | POA: Diagnosis not present

## 2019-05-18 DIAGNOSIS — D491 Neoplasm of unspecified behavior of respiratory system: Secondary | ICD-10-CM | POA: Diagnosis not present

## 2019-05-18 DIAGNOSIS — Z7984 Long term (current) use of oral hypoglycemic drugs: Secondary | ICD-10-CM | POA: Diagnosis not present

## 2019-05-18 DIAGNOSIS — Z794 Long term (current) use of insulin: Secondary | ICD-10-CM | POA: Diagnosis not present

## 2019-05-18 DIAGNOSIS — J449 Chronic obstructive pulmonary disease, unspecified: Secondary | ICD-10-CM | POA: Diagnosis not present

## 2019-05-22 DIAGNOSIS — Z23 Encounter for immunization: Secondary | ICD-10-CM | POA: Diagnosis not present

## 2019-05-30 DIAGNOSIS — J449 Chronic obstructive pulmonary disease, unspecified: Secondary | ICD-10-CM | POA: Diagnosis not present

## 2019-05-30 DIAGNOSIS — F331 Major depressive disorder, recurrent, moderate: Secondary | ICD-10-CM | POA: Diagnosis not present

## 2019-06-01 DIAGNOSIS — G301 Alzheimer's disease with late onset: Secondary | ICD-10-CM | POA: Diagnosis not present

## 2019-06-01 DIAGNOSIS — F0281 Dementia in other diseases classified elsewhere with behavioral disturbance: Secondary | ICD-10-CM | POA: Diagnosis not present

## 2019-06-05 DIAGNOSIS — Z20828 Contact with and (suspected) exposure to other viral communicable diseases: Secondary | ICD-10-CM | POA: Diagnosis not present

## 2019-06-07 DIAGNOSIS — E119 Type 2 diabetes mellitus without complications: Secondary | ICD-10-CM | POA: Diagnosis not present

## 2019-06-07 DIAGNOSIS — J449 Chronic obstructive pulmonary disease, unspecified: Secondary | ICD-10-CM | POA: Diagnosis not present

## 2019-06-07 DIAGNOSIS — F329 Major depressive disorder, single episode, unspecified: Secondary | ICD-10-CM | POA: Diagnosis not present

## 2019-06-07 DIAGNOSIS — E785 Hyperlipidemia, unspecified: Secondary | ICD-10-CM | POA: Diagnosis not present

## 2019-06-09 DIAGNOSIS — G8929 Other chronic pain: Secondary | ICD-10-CM | POA: Diagnosis not present

## 2019-06-09 DIAGNOSIS — D491 Neoplasm of unspecified behavior of respiratory system: Secondary | ICD-10-CM | POA: Diagnosis not present

## 2019-06-09 DIAGNOSIS — R918 Other nonspecific abnormal finding of lung field: Secondary | ICD-10-CM | POA: Diagnosis not present

## 2019-06-09 DIAGNOSIS — J449 Chronic obstructive pulmonary disease, unspecified: Secondary | ICD-10-CM | POA: Diagnosis not present

## 2019-06-12 DIAGNOSIS — Z79899 Other long term (current) drug therapy: Secondary | ICD-10-CM | POA: Diagnosis not present

## 2019-06-12 DIAGNOSIS — Z20828 Contact with and (suspected) exposure to other viral communicable diseases: Secondary | ICD-10-CM | POA: Diagnosis not present

## 2019-06-13 DIAGNOSIS — E0840 Diabetes mellitus due to underlying condition with diabetic neuropathy, unspecified: Secondary | ICD-10-CM | POA: Diagnosis present

## 2019-06-13 DIAGNOSIS — F0390 Unspecified dementia without behavioral disturbance: Secondary | ICD-10-CM | POA: Diagnosis present

## 2019-06-13 DIAGNOSIS — E11628 Type 2 diabetes mellitus with other skin complications: Secondary | ICD-10-CM | POA: Diagnosis present

## 2019-06-13 DIAGNOSIS — F329 Major depressive disorder, single episode, unspecified: Secondary | ICD-10-CM | POA: Diagnosis present

## 2019-06-13 DIAGNOSIS — Z79899 Other long term (current) drug therapy: Secondary | ICD-10-CM | POA: Diagnosis not present

## 2019-06-13 DIAGNOSIS — U071 COVID-19: Secondary | ICD-10-CM | POA: Diagnosis not present

## 2019-06-13 DIAGNOSIS — F29 Unspecified psychosis not due to a substance or known physiological condition: Secondary | ICD-10-CM | POA: Diagnosis present

## 2019-06-13 DIAGNOSIS — E785 Hyperlipidemia, unspecified: Secondary | ICD-10-CM | POA: Diagnosis present

## 2019-06-13 DIAGNOSIS — F419 Anxiety disorder, unspecified: Secondary | ICD-10-CM | POA: Diagnosis present

## 2019-06-13 DIAGNOSIS — J449 Chronic obstructive pulmonary disease, unspecified: Secondary | ICD-10-CM | POA: Diagnosis present

## 2019-06-14 DIAGNOSIS — U071 COVID-19: Secondary | ICD-10-CM | POA: Diagnosis not present

## 2019-07-03 DIAGNOSIS — F329 Major depressive disorder, single episode, unspecified: Secondary | ICD-10-CM | POA: Diagnosis not present

## 2019-07-03 DIAGNOSIS — J449 Chronic obstructive pulmonary disease, unspecified: Secondary | ICD-10-CM | POA: Diagnosis not present

## 2019-07-03 DIAGNOSIS — E119 Type 2 diabetes mellitus without complications: Secondary | ICD-10-CM | POA: Diagnosis not present

## 2019-07-03 DIAGNOSIS — E785 Hyperlipidemia, unspecified: Secondary | ICD-10-CM | POA: Diagnosis not present

## 2019-07-05 DIAGNOSIS — F0281 Dementia in other diseases classified elsewhere with behavioral disturbance: Secondary | ICD-10-CM | POA: Diagnosis not present

## 2019-07-05 DIAGNOSIS — K219 Gastro-esophageal reflux disease without esophagitis: Secondary | ICD-10-CM | POA: Diagnosis not present

## 2019-07-05 DIAGNOSIS — F331 Major depressive disorder, recurrent, moderate: Secondary | ICD-10-CM | POA: Diagnosis not present

## 2019-07-05 DIAGNOSIS — U071 COVID-19: Secondary | ICD-10-CM | POA: Diagnosis not present

## 2019-07-05 DIAGNOSIS — E119 Type 2 diabetes mellitus without complications: Secondary | ICD-10-CM | POA: Diagnosis not present

## 2019-07-05 DIAGNOSIS — G4701 Insomnia due to medical condition: Secondary | ICD-10-CM | POA: Diagnosis not present

## 2019-07-05 DIAGNOSIS — F0390 Unspecified dementia without behavioral disturbance: Secondary | ICD-10-CM | POA: Diagnosis not present

## 2019-07-05 DIAGNOSIS — G301 Alzheimer's disease with late onset: Secondary | ICD-10-CM | POA: Diagnosis not present

## 2019-07-13 DIAGNOSIS — L602 Onychogryphosis: Secondary | ICD-10-CM | POA: Diagnosis not present

## 2019-07-13 DIAGNOSIS — E1151 Type 2 diabetes mellitus with diabetic peripheral angiopathy without gangrene: Secondary | ICD-10-CM | POA: Diagnosis not present

## 2019-07-13 DIAGNOSIS — Z794 Long term (current) use of insulin: Secondary | ICD-10-CM | POA: Diagnosis not present

## 2019-08-17 DIAGNOSIS — Z23 Encounter for immunization: Secondary | ICD-10-CM | POA: Diagnosis not present

## 2019-08-30 DIAGNOSIS — G301 Alzheimer's disease with late onset: Secondary | ICD-10-CM | POA: Diagnosis not present

## 2019-08-30 DIAGNOSIS — I10 Essential (primary) hypertension: Secondary | ICD-10-CM | POA: Diagnosis not present

## 2019-08-31 DIAGNOSIS — F0281 Dementia in other diseases classified elsewhere with behavioral disturbance: Secondary | ICD-10-CM | POA: Diagnosis not present

## 2019-08-31 DIAGNOSIS — G4701 Insomnia due to medical condition: Secondary | ICD-10-CM | POA: Diagnosis not present

## 2019-08-31 DIAGNOSIS — G301 Alzheimer's disease with late onset: Secondary | ICD-10-CM | POA: Diagnosis not present

## 2019-08-31 DIAGNOSIS — F331 Major depressive disorder, recurrent, moderate: Secondary | ICD-10-CM | POA: Diagnosis not present

## 2019-09-06 DIAGNOSIS — R2689 Other abnormalities of gait and mobility: Secondary | ICD-10-CM | POA: Diagnosis not present

## 2019-09-06 DIAGNOSIS — E0840 Diabetes mellitus due to underlying condition with diabetic neuropathy, unspecified: Secondary | ICD-10-CM | POA: Diagnosis not present

## 2019-09-06 DIAGNOSIS — F0390 Unspecified dementia without behavioral disturbance: Secondary | ICD-10-CM | POA: Diagnosis not present

## 2019-09-06 DIAGNOSIS — M6281 Muscle weakness (generalized): Secondary | ICD-10-CM | POA: Diagnosis not present

## 2019-09-07 DIAGNOSIS — R2689 Other abnormalities of gait and mobility: Secondary | ICD-10-CM | POA: Diagnosis not present

## 2019-09-07 DIAGNOSIS — F0390 Unspecified dementia without behavioral disturbance: Secondary | ICD-10-CM | POA: Diagnosis not present

## 2019-09-07 DIAGNOSIS — M6281 Muscle weakness (generalized): Secondary | ICD-10-CM | POA: Diagnosis not present

## 2019-09-07 DIAGNOSIS — E0840 Diabetes mellitus due to underlying condition with diabetic neuropathy, unspecified: Secondary | ICD-10-CM | POA: Diagnosis not present

## 2019-09-10 DIAGNOSIS — F329 Major depressive disorder, single episode, unspecified: Secondary | ICD-10-CM | POA: Diagnosis not present

## 2019-09-10 DIAGNOSIS — J449 Chronic obstructive pulmonary disease, unspecified: Secondary | ICD-10-CM | POA: Diagnosis not present

## 2019-09-10 DIAGNOSIS — E119 Type 2 diabetes mellitus without complications: Secondary | ICD-10-CM | POA: Diagnosis not present

## 2019-09-10 DIAGNOSIS — E785 Hyperlipidemia, unspecified: Secondary | ICD-10-CM | POA: Diagnosis not present

## 2019-09-11 DIAGNOSIS — M6281 Muscle weakness (generalized): Secondary | ICD-10-CM | POA: Diagnosis not present

## 2019-09-11 DIAGNOSIS — E0840 Diabetes mellitus due to underlying condition with diabetic neuropathy, unspecified: Secondary | ICD-10-CM | POA: Diagnosis not present

## 2019-09-11 DIAGNOSIS — R2689 Other abnormalities of gait and mobility: Secondary | ICD-10-CM | POA: Diagnosis not present

## 2019-09-11 DIAGNOSIS — F0390 Unspecified dementia without behavioral disturbance: Secondary | ICD-10-CM | POA: Diagnosis not present

## 2019-09-12 DIAGNOSIS — F0390 Unspecified dementia without behavioral disturbance: Secondary | ICD-10-CM | POA: Diagnosis not present

## 2019-09-12 DIAGNOSIS — E0840 Diabetes mellitus due to underlying condition with diabetic neuropathy, unspecified: Secondary | ICD-10-CM | POA: Diagnosis not present

## 2019-09-12 DIAGNOSIS — M6281 Muscle weakness (generalized): Secondary | ICD-10-CM | POA: Diagnosis not present

## 2019-09-12 DIAGNOSIS — R2689 Other abnormalities of gait and mobility: Secondary | ICD-10-CM | POA: Diagnosis not present

## 2019-09-13 DIAGNOSIS — R2689 Other abnormalities of gait and mobility: Secondary | ICD-10-CM | POA: Diagnosis not present

## 2019-09-13 DIAGNOSIS — F0390 Unspecified dementia without behavioral disturbance: Secondary | ICD-10-CM | POA: Diagnosis not present

## 2019-09-13 DIAGNOSIS — M6281 Muscle weakness (generalized): Secondary | ICD-10-CM | POA: Diagnosis not present

## 2019-09-13 DIAGNOSIS — E0840 Diabetes mellitus due to underlying condition with diabetic neuropathy, unspecified: Secondary | ICD-10-CM | POA: Diagnosis not present

## 2019-09-14 DIAGNOSIS — F0390 Unspecified dementia without behavioral disturbance: Secondary | ICD-10-CM | POA: Diagnosis not present

## 2019-09-14 DIAGNOSIS — J449 Chronic obstructive pulmonary disease, unspecified: Secondary | ICD-10-CM | POA: Diagnosis not present

## 2019-09-14 DIAGNOSIS — E119 Type 2 diabetes mellitus without complications: Secondary | ICD-10-CM | POA: Diagnosis not present

## 2019-09-14 DIAGNOSIS — R2689 Other abnormalities of gait and mobility: Secondary | ICD-10-CM | POA: Diagnosis not present

## 2019-09-14 DIAGNOSIS — Z23 Encounter for immunization: Secondary | ICD-10-CM | POA: Diagnosis not present

## 2019-09-14 DIAGNOSIS — E0840 Diabetes mellitus due to underlying condition with diabetic neuropathy, unspecified: Secondary | ICD-10-CM | POA: Diagnosis not present

## 2019-09-14 DIAGNOSIS — M6281 Muscle weakness (generalized): Secondary | ICD-10-CM | POA: Diagnosis not present

## 2019-09-14 DIAGNOSIS — K219 Gastro-esophageal reflux disease without esophagitis: Secondary | ICD-10-CM | POA: Diagnosis not present

## 2019-09-18 DIAGNOSIS — M6281 Muscle weakness (generalized): Secondary | ICD-10-CM | POA: Diagnosis not present

## 2019-09-18 DIAGNOSIS — R2689 Other abnormalities of gait and mobility: Secondary | ICD-10-CM | POA: Diagnosis not present

## 2019-09-18 DIAGNOSIS — J449 Chronic obstructive pulmonary disease, unspecified: Secondary | ICD-10-CM | POA: Diagnosis not present

## 2019-09-18 DIAGNOSIS — E0840 Diabetes mellitus due to underlying condition with diabetic neuropathy, unspecified: Secondary | ICD-10-CM | POA: Diagnosis not present

## 2019-09-18 DIAGNOSIS — K219 Gastro-esophageal reflux disease without esophagitis: Secondary | ICD-10-CM | POA: Diagnosis not present

## 2019-09-18 DIAGNOSIS — F0390 Unspecified dementia without behavioral disturbance: Secondary | ICD-10-CM | POA: Diagnosis not present

## 2019-09-18 DIAGNOSIS — E785 Hyperlipidemia, unspecified: Secondary | ICD-10-CM | POA: Diagnosis not present

## 2019-09-18 DIAGNOSIS — E119 Type 2 diabetes mellitus without complications: Secondary | ICD-10-CM | POA: Diagnosis not present

## 2019-09-19 DIAGNOSIS — E0840 Diabetes mellitus due to underlying condition with diabetic neuropathy, unspecified: Secondary | ICD-10-CM | POA: Diagnosis not present

## 2019-09-19 DIAGNOSIS — R2689 Other abnormalities of gait and mobility: Secondary | ICD-10-CM | POA: Diagnosis not present

## 2019-09-19 DIAGNOSIS — M6281 Muscle weakness (generalized): Secondary | ICD-10-CM | POA: Diagnosis not present

## 2019-09-19 DIAGNOSIS — F0390 Unspecified dementia without behavioral disturbance: Secondary | ICD-10-CM | POA: Diagnosis not present

## 2019-09-19 DIAGNOSIS — Z20828 Contact with and (suspected) exposure to other viral communicable diseases: Secondary | ICD-10-CM | POA: Diagnosis not present

## 2019-09-20 DIAGNOSIS — R2689 Other abnormalities of gait and mobility: Secondary | ICD-10-CM | POA: Diagnosis not present

## 2019-09-20 DIAGNOSIS — E0840 Diabetes mellitus due to underlying condition with diabetic neuropathy, unspecified: Secondary | ICD-10-CM | POA: Diagnosis not present

## 2019-09-20 DIAGNOSIS — F0390 Unspecified dementia without behavioral disturbance: Secondary | ICD-10-CM | POA: Diagnosis not present

## 2019-09-20 DIAGNOSIS — M6281 Muscle weakness (generalized): Secondary | ICD-10-CM | POA: Diagnosis not present

## 2019-09-21 DIAGNOSIS — R2689 Other abnormalities of gait and mobility: Secondary | ICD-10-CM | POA: Diagnosis not present

## 2019-09-21 DIAGNOSIS — F0390 Unspecified dementia without behavioral disturbance: Secondary | ICD-10-CM | POA: Diagnosis not present

## 2019-09-21 DIAGNOSIS — E0840 Diabetes mellitus due to underlying condition with diabetic neuropathy, unspecified: Secondary | ICD-10-CM | POA: Diagnosis not present

## 2019-09-21 DIAGNOSIS — F0281 Dementia in other diseases classified elsewhere with behavioral disturbance: Secondary | ICD-10-CM | POA: Diagnosis not present

## 2019-09-21 DIAGNOSIS — G301 Alzheimer's disease with late onset: Secondary | ICD-10-CM | POA: Diagnosis not present

## 2019-09-21 DIAGNOSIS — M6281 Muscle weakness (generalized): Secondary | ICD-10-CM | POA: Diagnosis not present

## 2019-09-25 DIAGNOSIS — F0390 Unspecified dementia without behavioral disturbance: Secondary | ICD-10-CM | POA: Diagnosis not present

## 2019-09-25 DIAGNOSIS — M6281 Muscle weakness (generalized): Secondary | ICD-10-CM | POA: Diagnosis not present

## 2019-09-25 DIAGNOSIS — E0840 Diabetes mellitus due to underlying condition with diabetic neuropathy, unspecified: Secondary | ICD-10-CM | POA: Diagnosis not present

## 2019-09-25 DIAGNOSIS — R2689 Other abnormalities of gait and mobility: Secondary | ICD-10-CM | POA: Diagnosis not present

## 2019-09-26 DIAGNOSIS — M6281 Muscle weakness (generalized): Secondary | ICD-10-CM | POA: Diagnosis not present

## 2019-09-26 DIAGNOSIS — F0390 Unspecified dementia without behavioral disturbance: Secondary | ICD-10-CM | POA: Diagnosis not present

## 2019-09-26 DIAGNOSIS — Z20828 Contact with and (suspected) exposure to other viral communicable diseases: Secondary | ICD-10-CM | POA: Diagnosis not present

## 2019-09-26 DIAGNOSIS — R2689 Other abnormalities of gait and mobility: Secondary | ICD-10-CM | POA: Diagnosis not present

## 2019-09-26 DIAGNOSIS — E0840 Diabetes mellitus due to underlying condition with diabetic neuropathy, unspecified: Secondary | ICD-10-CM | POA: Diagnosis not present

## 2019-09-27 DIAGNOSIS — M6281 Muscle weakness (generalized): Secondary | ICD-10-CM | POA: Diagnosis not present

## 2019-09-27 DIAGNOSIS — R2689 Other abnormalities of gait and mobility: Secondary | ICD-10-CM | POA: Diagnosis not present

## 2019-09-27 DIAGNOSIS — E0840 Diabetes mellitus due to underlying condition with diabetic neuropathy, unspecified: Secondary | ICD-10-CM | POA: Diagnosis not present

## 2019-09-27 DIAGNOSIS — F0390 Unspecified dementia without behavioral disturbance: Secondary | ICD-10-CM | POA: Diagnosis not present

## 2019-09-28 DIAGNOSIS — F0390 Unspecified dementia without behavioral disturbance: Secondary | ICD-10-CM | POA: Diagnosis not present

## 2019-09-28 DIAGNOSIS — R2689 Other abnormalities of gait and mobility: Secondary | ICD-10-CM | POA: Diagnosis not present

## 2019-09-28 DIAGNOSIS — M6281 Muscle weakness (generalized): Secondary | ICD-10-CM | POA: Diagnosis not present

## 2019-09-28 DIAGNOSIS — E0840 Diabetes mellitus due to underlying condition with diabetic neuropathy, unspecified: Secondary | ICD-10-CM | POA: Diagnosis not present

## 2019-09-29 DIAGNOSIS — F0390 Unspecified dementia without behavioral disturbance: Secondary | ICD-10-CM | POA: Diagnosis not present

## 2019-09-29 DIAGNOSIS — E0840 Diabetes mellitus due to underlying condition with diabetic neuropathy, unspecified: Secondary | ICD-10-CM | POA: Diagnosis not present

## 2019-09-29 DIAGNOSIS — M6281 Muscle weakness (generalized): Secondary | ICD-10-CM | POA: Diagnosis not present

## 2019-09-29 DIAGNOSIS — R2689 Other abnormalities of gait and mobility: Secondary | ICD-10-CM | POA: Diagnosis not present

## 2019-09-30 DIAGNOSIS — Z20828 Contact with and (suspected) exposure to other viral communicable diseases: Secondary | ICD-10-CM | POA: Diagnosis not present

## 2019-10-03 DIAGNOSIS — Z20828 Contact with and (suspected) exposure to other viral communicable diseases: Secondary | ICD-10-CM | POA: Diagnosis not present

## 2019-10-05 DIAGNOSIS — G4701 Insomnia due to medical condition: Secondary | ICD-10-CM | POA: Diagnosis not present

## 2019-10-05 DIAGNOSIS — F331 Major depressive disorder, recurrent, moderate: Secondary | ICD-10-CM | POA: Diagnosis not present

## 2019-10-05 DIAGNOSIS — G301 Alzheimer's disease with late onset: Secondary | ICD-10-CM | POA: Diagnosis not present

## 2019-10-05 DIAGNOSIS — F0281 Dementia in other diseases classified elsewhere with behavioral disturbance: Secondary | ICD-10-CM | POA: Diagnosis not present

## 2019-10-06 DIAGNOSIS — M2042 Other hammer toe(s) (acquired), left foot: Secondary | ICD-10-CM | POA: Diagnosis not present

## 2019-10-06 DIAGNOSIS — M2041 Other hammer toe(s) (acquired), right foot: Secondary | ICD-10-CM | POA: Diagnosis not present

## 2019-10-06 DIAGNOSIS — L603 Nail dystrophy: Secondary | ICD-10-CM | POA: Diagnosis not present

## 2019-10-06 DIAGNOSIS — B351 Tinea unguium: Secondary | ICD-10-CM | POA: Diagnosis not present

## 2019-10-06 DIAGNOSIS — Z794 Long term (current) use of insulin: Secondary | ICD-10-CM | POA: Diagnosis not present

## 2019-10-06 DIAGNOSIS — E1140 Type 2 diabetes mellitus with diabetic neuropathy, unspecified: Secondary | ICD-10-CM | POA: Diagnosis not present

## 2019-10-09 DIAGNOSIS — Z20828 Contact with and (suspected) exposure to other viral communicable diseases: Secondary | ICD-10-CM | POA: Diagnosis not present

## 2019-10-16 DIAGNOSIS — Z20828 Contact with and (suspected) exposure to other viral communicable diseases: Secondary | ICD-10-CM | POA: Diagnosis not present

## 2019-10-23 DIAGNOSIS — Z20828 Contact with and (suspected) exposure to other viral communicable diseases: Secondary | ICD-10-CM | POA: Diagnosis not present

## 2019-10-30 DIAGNOSIS — Z20828 Contact with and (suspected) exposure to other viral communicable diseases: Secondary | ICD-10-CM | POA: Diagnosis not present

## 2019-11-05 DIAGNOSIS — Z20828 Contact with and (suspected) exposure to other viral communicable diseases: Secondary | ICD-10-CM | POA: Diagnosis not present

## 2019-12-28 ENCOUNTER — Emergency Department (HOSPITAL_COMMUNITY): Payer: Medicare (Managed Care)

## 2019-12-28 ENCOUNTER — Encounter (HOSPITAL_COMMUNITY): Payer: Self-pay | Admitting: Emergency Medicine

## 2019-12-28 ENCOUNTER — Emergency Department (HOSPITAL_COMMUNITY)
Admission: EM | Admit: 2019-12-28 | Discharge: 2019-12-29 | Disposition: A | Discharge status: Home / Self Care | Payer: Medicare (Managed Care) | Attending: Emergency Medicine | Admitting: Emergency Medicine

## 2019-12-28 ENCOUNTER — Other Ambulatory Visit: Payer: Self-pay

## 2019-12-28 DIAGNOSIS — I10 Essential (primary) hypertension: Secondary | ICD-10-CM | POA: Insufficient documentation

## 2019-12-28 DIAGNOSIS — M549 Dorsalgia, unspecified: Secondary | ICD-10-CM | POA: Insufficient documentation

## 2019-12-28 DIAGNOSIS — R296 Repeated falls: Secondary | ICD-10-CM | POA: Diagnosis not present

## 2019-12-28 DIAGNOSIS — Y939 Activity, unspecified: Secondary | ICD-10-CM | POA: Diagnosis not present

## 2019-12-28 DIAGNOSIS — I959 Hypotension, unspecified: Secondary | ICD-10-CM | POA: Diagnosis not present

## 2019-12-28 DIAGNOSIS — J449 Chronic obstructive pulmonary disease, unspecified: Secondary | ICD-10-CM | POA: Diagnosis not present

## 2019-12-28 DIAGNOSIS — W19XXXA Unspecified fall, initial encounter: Secondary | ICD-10-CM | POA: Insufficient documentation

## 2019-12-28 DIAGNOSIS — Y999 Unspecified external cause status: Secondary | ICD-10-CM | POA: Diagnosis not present

## 2019-12-28 DIAGNOSIS — Z794 Long term (current) use of insulin: Secondary | ICD-10-CM | POA: Insufficient documentation

## 2019-12-28 DIAGNOSIS — Z7982 Long term (current) use of aspirin: Secondary | ICD-10-CM | POA: Diagnosis not present

## 2019-12-28 DIAGNOSIS — R0902 Hypoxemia: Secondary | ICD-10-CM | POA: Diagnosis not present

## 2019-12-28 DIAGNOSIS — E119 Type 2 diabetes mellitus without complications: Secondary | ICD-10-CM | POA: Insufficient documentation

## 2019-12-28 DIAGNOSIS — F0390 Unspecified dementia without behavioral disturbance: Secondary | ICD-10-CM | POA: Diagnosis not present

## 2019-12-28 DIAGNOSIS — Z79899 Other long term (current) drug therapy: Secondary | ICD-10-CM | POA: Diagnosis not present

## 2019-12-28 DIAGNOSIS — Y92129 Unspecified place in nursing home as the place of occurrence of the external cause: Secondary | ICD-10-CM | POA: Diagnosis not present

## 2019-12-28 DIAGNOSIS — Z87891 Personal history of nicotine dependence: Secondary | ICD-10-CM | POA: Insufficient documentation

## 2019-12-28 LAB — CBC WITH DIFFERENTIAL/PLATELET
Abs Immature Granulocytes: 0.05 10*3/uL (ref 0.00–0.07)
Basophils Absolute: 0.1 10*3/uL (ref 0.0–0.1)
Basophils Relative: 1 %
Eosinophils Absolute: 0.2 10*3/uL (ref 0.0–0.5)
Eosinophils Relative: 1 %
HCT: 32 % — ABNORMAL LOW (ref 36.0–46.0)
Hemoglobin: 9.4 g/dL — ABNORMAL LOW (ref 12.0–15.0)
Immature Granulocytes: 0 %
Lymphocytes Relative: 16 %
Lymphs Abs: 1.9 10*3/uL (ref 0.7–4.0)
MCH: 23.3 pg — ABNORMAL LOW (ref 26.0–34.0)
MCHC: 29.4 g/dL — ABNORMAL LOW (ref 30.0–36.0)
MCV: 79.2 fL — ABNORMAL LOW (ref 80.0–100.0)
Monocytes Absolute: 1.2 10*3/uL — ABNORMAL HIGH (ref 0.1–1.0)
Monocytes Relative: 10 %
Neutro Abs: 8.7 10*3/uL — ABNORMAL HIGH (ref 1.7–7.7)
Neutrophils Relative %: 72 %
Platelets: 471 10*3/uL — ABNORMAL HIGH (ref 150–400)
RBC: 4.04 MIL/uL (ref 3.87–5.11)
RDW: 16.9 % — ABNORMAL HIGH (ref 11.5–15.5)
WBC: 12.1 10*3/uL — ABNORMAL HIGH (ref 4.0–10.5)
nRBC: 0 % (ref 0.0–0.2)

## 2019-12-28 NOTE — ED Triage Notes (Signed)
As per EMS pt had a unwitnessed fall. Pt was found on the floor at the SNF. No injuries noted

## 2019-12-28 NOTE — ED Provider Notes (Signed)
[place] DEPT Provider Note  CSN: [id number] Arrival date & time: 12/28/19 2220  Chief Complaint(s) Fall  ED Triage Notes [place], [place], Therapist, sports (Equities trader) . [facility] Kitchen [facility] . Marland Kitchen 12/28/2019 10:27 PM . . Signed   As per EMS pt had a unwitnessed fall. Pt was found on the floor at the SNF. No injuries noted      HPI [name] [name] is a 79 y.o. 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 On review of sNF records, no anticoagulation noted.  Patient only complains of mid/upper back pain ongoing for "months."  HPI   11:17 PM Attempted to contact son, Mr. Ellyn Hack. No answer. Past Medical History Past Medical History:  Diagnosis Date  . Abnormality of gait   . Anxiety state, unspecified   . Candidiasis of other urogenital sites   . Chronic airway obstruction, not elsewhere classified   . Chronic obstructive lung disease ([place])   . Depressive disorder, not elsewhere classified   . Esophageal reflux   . Hyperlipemia   . Lumbago   . Osteoporosis   . Spinal stenosis   . 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 Unspecified dementia without behavioral disturbance [facility]    Patient Active Problem List   Diagnosis Date Noted  . Dry eye 07/09/2017  . Weight loss, non-intentional 04/01/2017  . Hypertension associated with diabetes ([place]) 01/04/2017  . Major depressive disorder, recurrent episode ([place]) 01/14/2015  . Hyperlipidemia associated with type 2 diabetes mellitus ([place]) 06/03/2014  . Chronic obstructive pulmonary disease ([place]) 06/03/2014  . 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 Type 2 diabetes mellitus with complication, with long-term current use of insulin ([place]) 10/04/2013  . Anxiety state 01/20/2013  . Hypokalemia 01/20/2013  . Chronic low back pain without sciatica 01/20/2013  . GERD without esophagitis 01/16/2008   Home Medication(s) Prior to Admission medications   Medication  Sig Start Date End Date Taking? Authorizing Provider  acetaminophen (TYLENOL) 325 MG tablet Take 325 mg by mouth every 4 (four) hours as needed (pain).     [provider]  aspirin EC 81 MG tablet Take 1 tablet (81 mg total) by mouth daily. 11/06/14   Gerlene Fee, NP  atorvastatin (LIPITOR) 10 MG tablet Take 10 mg by mouth daily.    [provider]  bisacodyl (DULCOLAX) 10 MG suppository Place 10 mg rectally daily as needed for moderate constipation.    [provider]  cholecalciferol (VITAMIN D3) 25 MCG (1000 UT) tablet Take 2,000 Units by mouth at bedtime.    [provider]  DULoxetine (CYMBALTA) 60 MG capsule Take 60 mg by mouth daily.    [provider]  fluticasone (FLONASE) 50 MCG/ACT nasal spray Place 2 sprays into both nostrils at bedtime.    [provider]  gabapentin (NEURONTIN) 100 MG capsule Take 1 capsule (100 mg total) by mouth 3 (three) times daily. 06/20/14   Reed, Tiffany L, DO  Guaifenesin (MUCINEX MAXIMUM STRENGTH) 1200 MG TB12 Take 1 tablet by mouth 2 (two) times daily.     [provider]  Hypromellose 0.4 % SOLN Place 2 drops into both eyes 2 (two) times daily.     [provider]  Insulin Detemir (LEVEMIR FLEXTOUCH) 100 UNIT/ML Pen Inject 30 Units into the skin at bedtime.  07/06/17   [provider]  linagliptin (TRADJENTA) 5 MG TABS tablet Take 5 mg by mouth daily. 01/21/18   [provider]  Melatonin 3 MG TABS Take 6 mg by mouth at bedtime.  04/14/18   [provider]  Memantine HCl-Donepezil HCl (NAMZARIC) 28-10 MG CP24 Take 1 capsule by mouth every evening. For dementia    [provider]  metFORMIN (GLUCOPHAGE) 500 MG tablet Take 500 mg by mouth daily with breakfast.    [provider]  ondansetron (ZOFRAN) 4 MG tablet Take 4 mg by mouth every 6 (six) hours as needed for nausea or vomiting. 04/24/18   [provider]  ranitidine (ZANTAC) 150  MG tablet Take 150 mg by mouth 2 (two) times daily.     [provider]  Suvorexant (BELSOMRA) 15 MG TABS Take 15 mg by mouth at bedtime.    [provider]  umeclidinium bromide (INCRUSE ELLIPTA) 62.5 MCG/INH AEPB Inhale 1 puff into the lungs daily.     [provider]                                                                                                                                    Past Surgical History Past Surgical History:  Procedure Laterality Date  . ABDOMINAL HYSTERECTOMY    . APPENDECTOMY    . CHOLECYSTECTOMY    . VERTEBROPLASTY     Family History Family History  Family history unknown: Yes    Social History Social History   Tobacco Use  . Smoking status: Former Smoker    Packs/day: 1.00    Years: 20.00    Pack years: 20.00  . Smokeless tobacco: Never Used  Substance Use Topics  . Alcohol use: No  . Drug use: No   Allergies Avelox [moxifloxacin hcl in nacl], Codeine, Compazine [prochlorperazine edisylate], Morphine and related, Mycobacterium, Penicillins, Phenothiazines, Prednisone, Reglan [metoclopramide], Sulfonamide derivatives, and Tuberculin tests  Review of Systems Review of Systems All other systems are reviewed and are negative for acute change except as noted in the HPI  Physical Exam Vital Signs  I have reviewed the triage vital signs BP (!) 97/47 (BP Location: Left Arm)   Pulse 61   Temp 98.6 F (37 C) (Oral)   Resp 18   Ht 5\' 2"  (1.575 m)   Wt 54.4 kg   SpO2 97%   BMI 21.95 kg/m   Physical Exam Constitutional:      General: She is not in acute distress.    Appearance: She is well-developed. She is not diaphoretic.  HENT:     Head: Normocephalic and atraumatic.     Right Ear: External ear normal.     Left Ear: External ear normal.     Nose: Nose normal.  Eyes:     General: No scleral icterus.       Right eye: No discharge.        Left eye: No discharge.     Conjunctiva/sclera: Conjunctivae  normal.     Pupils: Pupils are equal, round, and  reactive to light.  Cardiovascular:     Rate and Rhythm: Normal rate and regular rhythm.     Pulses:          Radial pulses are 2+ on the right side and 2+ on the left side.       Dorsalis pedis pulses are 2+ on the right side and 2+ on the left side.     Heart sounds: Normal heart sounds. No murmur. No friction rub. No gallop.   Pulmonary:     Effort: Pulmonary effort is normal. No respiratory distress.     Breath sounds: Normal breath sounds. No stridor. No wheezing.  Abdominal:     General: There is no distension.     Palpations: Abdomen is soft.     Tenderness: There is no abdominal tenderness.  Musculoskeletal:     Cervical back: Normal range of motion and neck supple. No bony tenderness.     Thoracic back: Tenderness present. No bony tenderness.     Lumbar back: No bony tenderness.       Back:     Comments: Clavicles stable. Chest stable to AP/Lat compression. Pelvis stable to Lat compression. No obvious extremity deformity. No chest or abdominal wall contusion.  Skin:    General: Skin is warm and dry.     Findings: No erythema or rash.  Neurological:     Mental Status: She is alert and oriented to person, place, and time.     Comments: Moving all extremities     ED Results and Treatments Labs (all labs ordered are listed, but only abnormal results are displayed) Labs Reviewed - No data to display                                                                                                                       EKG  EKG Interpretation  Date/Time:  Thursday Dec 28 2019 23:52:00 EDT Ventricular Rate:  86 PR Interval:    QRS Duration: 85 QT Interval:  377 QTC Calculation: 451 R Axis:   81 Text Interpretation: Sinus rhythm Anteroseptal infarct, age indeterminate No significant change since last tracing Confirmed by Addison Lank [phone]) on 12/29/2019 12:02:34 AM      Radiology No results found.  Pertinent  labs & imaging results that were available during my care of the patient were reviewed by me and considered in my medical decision making (see chart for details).  Medications Ordered in ED Medications - No data to display  Procedures Procedures  (including critical care time)  Medical Decision Making / ED Course I have reviewed the nursing notes for this encounter and the patient's prior records (if available in EHR or on provided paperwork).   [name] [name] was evaluated in Emergency Department on 12/28/2019 for the symptoms described in the history of present illness. She was evaluated in the context of the global COVID-19 pandemic, which necessitated consideration that the patient might be at risk for infection with the SARS-CoV-2 virus that causes COVID-19. Institutional protocols and algorithms that pertain to the evaluation of patients at risk for COVID-19 are in a state of rapid change based on information released by regulatory bodies including the CDC and federal and state organizations. These policies and algorithms were followed during the patient's care in the ED.  Unwitnessed fall at sNF. No evidence of head trauma. No anticoagulation. No neck pain. Complains of back pain with midthoracic TTP. Moves BLE with good strength.   Plain films w/o acute fractures noted.       Final Clinical Impression(s) / ED Diagnoses Final diagnoses:  None   The patient appears reasonably screened and/or stabilized for discharge and I doubt any other medical condition or other [facility] requiring further screening, evaluation, or treatment in the ED at this time prior to discharge. Safe for discharge with strict return precautions.  Disposition: Discharge  Condition: Good  I have discussed the results, Dx and Tx plan with the patient/family who expressed understanding and  agree(s) with the plan. Discharge instructions discussed at length. The patient/family was given strict return precautions who verbalized understanding of the instructions. No further questions at time of discharge.    ED Discharge Orders    None       Follow Up: Primary care provider  Schedule an appointment as soon as possible for a visit        This chart was dictated using voice recognition software.  Despite best efforts to proofread,  errors can occur which can change the documentation meaning.   Fatima Blank, MD 12/29/19 [phone]

## 2019-12-29 DIAGNOSIS — M549 Dorsalgia, unspecified: Secondary | ICD-10-CM | POA: Diagnosis not present

## 2019-12-29 DIAGNOSIS — Z7401 Bed confinement status: Secondary | ICD-10-CM | POA: Diagnosis not present

## 2019-12-29 DIAGNOSIS — R457 State of emotional shock and stress, unspecified: Secondary | ICD-10-CM | POA: Diagnosis not present

## 2019-12-29 DIAGNOSIS — M2550 Pain in unspecified joint: Secondary | ICD-10-CM | POA: Diagnosis not present

## 2019-12-29 DIAGNOSIS — I10 Essential (primary) hypertension: Secondary | ICD-10-CM | POA: Diagnosis not present

## 2019-12-29 DIAGNOSIS — W19XXXA Unspecified fall, initial encounter: Secondary | ICD-10-CM | POA: Diagnosis not present

## 2019-12-29 LAB — BASIC METABOLIC PANEL
Anion gap: 11 (ref 5–15)
BUN: 11 mg/dL (ref 8–23)
CO2: 32 mmol/L (ref 22–32)
Calcium: 9.1 mg/dL (ref 8.9–10.3)
Chloride: 97 mmol/L — ABNORMAL LOW (ref 98–111)
Creatinine, Ser: 0.48 mg/dL (ref 0.44–1.00)
GFR calc Af Amer: >60 mL/min (ref >60)
GFR calc non Af Amer: >60 mL/min (ref >60)
Glucose, Bld: 135 mg/dL — ABNORMAL HIGH (ref 70–99)
Potassium: 3.7 mmol/L (ref 3.5–5.1)
Sodium: 140 mmol/L (ref 135–145)

## 2019-12-29 LAB — CBG MONITORING, ED: Glucose-Capillary: 122 mg/dL — ABNORMAL HIGH (ref 70–99)

## 2019-12-29 MED ORDER — ACETAMINOPHEN 325 MG PO TABS
650.0000 mg | ORAL_TABLET | Freq: Once | ORAL | Status: AC
  Administered 2019-12-29: 650 mg via ORAL
  Filled 2019-12-29: qty 2

## 2019-12-29 NOTE — ED Notes (Signed)
[facility] called to transport

## 2020-01-12 ENCOUNTER — Emergency Department (HOSPITAL_COMMUNITY)
Admission: EM | Admit: 2020-01-12 | Discharge: 2020-01-12 | Disposition: A | Discharge status: Home / Self Care | Payer: Medicare (Managed Care) | Attending: Emergency Medicine | Admitting: Emergency Medicine

## 2020-01-12 ENCOUNTER — Encounter (HOSPITAL_COMMUNITY): Payer: Self-pay | Admitting: Emergency Medicine

## 2020-01-12 ENCOUNTER — Emergency Department (HOSPITAL_COMMUNITY): Payer: Medicare (Managed Care)

## 2020-01-12 DIAGNOSIS — Z79899 Other long term (current) drug therapy: Secondary | ICD-10-CM | POA: Insufficient documentation

## 2020-01-12 DIAGNOSIS — I10 Essential (primary) hypertension: Secondary | ICD-10-CM | POA: Insufficient documentation

## 2020-01-12 DIAGNOSIS — R5381 Other malaise: Secondary | ICD-10-CM | POA: Diagnosis not present

## 2020-01-12 DIAGNOSIS — E119 Type 2 diabetes mellitus without complications: Secondary | ICD-10-CM | POA: Insufficient documentation

## 2020-01-12 DIAGNOSIS — Z87891 Personal history of nicotine dependence: Secondary | ICD-10-CM | POA: Diagnosis not present

## 2020-01-12 DIAGNOSIS — Z7982 Long term (current) use of aspirin: Secondary | ICD-10-CM | POA: Insufficient documentation

## 2020-01-12 DIAGNOSIS — M542 Cervicalgia: Secondary | ICD-10-CM | POA: Diagnosis not present

## 2020-01-12 DIAGNOSIS — Z794 Long term (current) use of insulin: Secondary | ICD-10-CM | POA: Diagnosis not present

## 2020-01-12 DIAGNOSIS — W06XXXA Fall from bed, initial encounter: Secondary | ICD-10-CM | POA: Insufficient documentation

## 2020-01-12 DIAGNOSIS — R52 Pain, unspecified: Secondary | ICD-10-CM | POA: Diagnosis not present

## 2020-01-12 DIAGNOSIS — M2550 Pain in unspecified joint: Secondary | ICD-10-CM | POA: Diagnosis not present

## 2020-01-12 DIAGNOSIS — W19XXXA Unspecified fall, initial encounter: Secondary | ICD-10-CM

## 2020-01-12 DIAGNOSIS — F0390 Unspecified dementia without behavioral disturbance: Secondary | ICD-10-CM | POA: Diagnosis not present

## 2020-01-12 DIAGNOSIS — Z7401 Bed confinement status: Secondary | ICD-10-CM | POA: Diagnosis not present

## 2020-01-12 NOTE — ED Notes (Signed)
Pt is high fall risk, pt has dementia. Safety sitter bedside.

## 2020-01-12 NOTE — ED Notes (Signed)
[facility] bedside. Attempted to call report to [facility] and was unsuccessful, RN put on hold x2 times.

## 2020-01-12 NOTE — ED Triage Notes (Signed)
Per EMS, patient had a unwitnessed fall-fell out of bed this am-complaining of pain all over

## 2020-01-12 NOTE — ED Provider Notes (Signed)
[place] DEPT Provider Note   CSN: [id number] Arrival date & time: 01/12/20  1037     History Chief Complaint  Patient presents with  . Fall    [name] [name] is a 79 y.o. female.  HPI Level 5 caveat due to dementia.  Reportedly fell out of bed.  Patient states she fell out of bed on the carpet.  States to me she has not heard anywhere reportedly had told people she heard all over.  Not on blood thinners.    Past Medical History:  Diagnosis Date  . Abnormality of gait   . Anxiety state, unspecified   . Candidiasis of other urogenital sites   . Chronic airway obstruction, not elsewhere classified   . Chronic obstructive lung disease ([place])   . Depressive disorder, not elsewhere classified   . Esophageal reflux   . Hyperlipemia   . Lumbago   . Osteoporosis   . Spinal stenosis   . 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 Unspecified dementia without behavioral disturbance [facility])     Patient Active Problem List   Diagnosis Date Noted  . Dry eye 07/09/2017  . Weight loss, non-intentional 04/01/2017  . Hypertension associated with diabetes ([place]) 01/04/2017  . Major depressive disorder, recurrent episode ([place]) 01/14/2015  . Hyperlipidemia associated with type 2 diabetes mellitus ([place]) 06/03/2014  . Chronic obstructive pulmonary disease ([place]) 06/03/2014  . 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 Type 2 diabetes mellitus with complication, with long-term current use of insulin ([place]) 10/04/2013  . Anxiety state 01/20/2013  . Hypokalemia 01/20/2013  . Chronic low back pain without sciatica 01/20/2013  . GERD without esophagitis 01/16/2008    Past Surgical History:  Procedure Laterality Date  . ABDOMINAL HYSTERECTOMY    . APPENDECTOMY    . CHOLECYSTECTOMY    . VERTEBROPLASTY       OB History   No obstetric history on file.     Family History  Family history unknown: Yes    Social History    Tobacco Use  . Smoking status: Former Smoker    Packs/day: 1.00    Years: 20.00    Pack years: 20.00  . Smokeless tobacco: Never Used  Substance Use Topics  . Alcohol use: No  . Drug use: No    Home Medications Prior to Admission medications   Medication Sig Start Date End Date Taking? Authorizing Provider  acetaminophen (TYLENOL) 325 MG tablet Take 325 mg by mouth every 4 (four) hours as needed (pain).     [provider]  aspirin EC 81 MG tablet Take 1 tablet (81 mg total) by mouth daily. 11/06/14   Gerlene Fee, NP  atorvastatin (LIPITOR) 10 MG tablet Take 10 mg by mouth daily.    [provider]  BELSOMRA 20 MG TABS Take 1 tablet by mouth at bedtime as needed for sleep. 12/17/19   [provider]  bisacodyl (DULCOLAX) 10 MG suppository Place 10 mg rectally daily as needed for moderate constipation.    [provider]  cholecalciferol (VITAMIN D3) 25 MCG (1000 UT) tablet Take 2,000 Units by mouth at bedtime.    [provider]  DULoxetine (CYMBALTA) 60 MG capsule Take 60 mg by mouth daily.    [provider]  fluticasone (FLONASE) 50 MCG/ACT nasal spray Place 2 sprays into both nostrils at bedtime.    [provider]  gabapentin (NEURONTIN) 100 MG capsule Take 1 capsule (  100 mg total) by mouth 3 (three) times daily. 06/20/14   Reed, Tiffany L, DO  Guaifenesin (MUCINEX MAXIMUM STRENGTH) 1200 MG TB12 Take 1 tablet by mouth 2 (two) times daily.     [provider]  Hypromellose 0.4 % SOLN Place 2 drops into both eyes 2 (two) times daily.     [provider]  Insulin Detemir (LEVEMIR FLEXTOUCH) 100 UNIT/ML Pen Inject 30 Units into the skin at bedtime.  07/06/17   [provider]  linagliptin (TRADJENTA) 5 MG TABS tablet Take 5 mg by mouth daily. 01/21/18   [provider]  Melatonin 3 MG TABS Take 6 mg by mouth at bedtime.  04/14/18   [provider]  Memantine HCl-Donepezil HCl  (NAMZARIC) 28-10 MG CP24 Take 1 capsule by mouth every evening. For dementia    [provider]  metFORMIN (GLUCOPHAGE) 500 MG tablet Take 500 mg by mouth daily with breakfast.    [provider]  omeprazole (PRILOSEC) 20 MG capsule Take 20 mg by mouth daily. 11/25/19   [provider]  ondansetron (ZOFRAN) 4 MG tablet Take 4 mg by mouth every 6 (six) hours as needed for nausea or vomiting. 04/24/18   [provider]  ranitidine (ZANTAC) 150 MG tablet Take 150 mg by mouth 2 (two) times daily.     [provider]  umeclidinium bromide (INCRUSE ELLIPTA) 62.5 MCG/INH AEPB Inhale 1 puff into the lungs daily.     [provider]    Allergies    Avelox [moxifloxacin hcl in nacl], Codeine, Compazine [prochlorperazine edisylate], Morphine and related, Mycobacterium, Penicillins, Phenothiazines, Prednisone, Reglan [metoclopramide], Sulfonamide derivatives, and Tuberculin tests  Review of Systems   Review of Systems  Unable to perform ROS: Dementia    Physical Exam Updated Vital Signs BP 103/60 (BP Location: Left Arm)   Pulse 83   Resp 18   SpO2 93%   Physical Exam Vitals and nursing note reviewed.  HENT:     Head: Normocephalic.  Eyes:     General: No scleral icterus. Cardiovascular:     Rate and Rhythm: Regular rhythm.  Pulmonary:     Breath sounds: No wheezing or rhonchi.  Chest:     Chest wall: No tenderness.  Abdominal:     Tenderness: There is no abdominal tenderness.  Musculoskeletal:     Comments: Tenderness over lumbar spine.  No tenderness over thoracic or cervical spine.  Painless range of motion neck.  No tenderness to palpation of upper or lower extremities.  No lacerations.  No chest tenderness.  Neurological:     Mental Status: She is alert. Mental status is at baseline.     ED Results / Procedures / Treatments   Labs (all labs ordered are listed, but only abnormal results are displayed) Labs Reviewed - No data to  display  EKG None  Radiology No results found.  Procedures Procedures (including critical care time)  Medications Ordered in ED Medications - No data to display  ED Course  I have reviewed the triage vital signs and the nursing notes.  Pertinent labs & imaging results that were available during my care of the patient were reviewed by me and considered in my medical decision making (see chart for details).    MDM Rules/Calculators/A&P                      Patient presents with a fall.  History of dementia.  Rather benign exam.  Not  on anticoagulation.  Does have lumbar tenderness but x-ray reassuring.  No tenderness to extremities chest or abdomen.  No apparent head trauma.  Discharge back to nursing home. Final Clinical Impression(s) / ED Diagnoses Final diagnoses:  Fall, initial encounter    Rx / DC Orders ED Discharge Orders    None       Davonna Belling, MD 01/12/20 1227

## 2020-01-12 NOTE — ED Notes (Addendum)
UTO temp at this time, PT did not tolerate any movement or touch to mouth, PT did not tolerate any movement or touch to axillary. Will reattempt shortly.

## 2020-01-12 NOTE — Discharge Instructions (Addendum)
Followup with your doctor as needed

## 2020-01-12 NOTE — ED Notes (Signed)
Pt son, Martyn Malay, aware pt discharge. Per son, pt to return to [facility]. [facility] called.

## 2020-03-10 DEATH — deceased
